# Patient Record
Sex: Female | Born: 1970
Health system: Southern US, Community
[De-identification: ages and names within clinical notes are randomized; demographics above are authoritative.]

## PROBLEM LIST (undated history)

## (undated) DIAGNOSIS — K589 Irritable bowel syndrome without diarrhea: Secondary | ICD-10-CM

## (undated) DIAGNOSIS — Z9109 Other allergy status, other than to drugs and biological substances: Secondary | ICD-10-CM

## (undated) DIAGNOSIS — E785 Hyperlipidemia, unspecified: Secondary | ICD-10-CM

## (undated) DIAGNOSIS — E559 Vitamin D deficiency, unspecified: Secondary | ICD-10-CM

## (undated) DIAGNOSIS — T4145XA Adverse effect of unspecified anesthetic, initial encounter: Secondary | ICD-10-CM

## (undated) DIAGNOSIS — E739 Lactose intolerance, unspecified: Secondary | ICD-10-CM

## (undated) DIAGNOSIS — G43109 Migraine with aura, not intractable, without status migrainosus: Secondary | ICD-10-CM

## (undated) DIAGNOSIS — K219 Gastro-esophageal reflux disease without esophagitis: Secondary | ICD-10-CM

## (undated) DIAGNOSIS — T8859XA Other complications of anesthesia, initial encounter: Secondary | ICD-10-CM

## (undated) DIAGNOSIS — R51 Headache: Secondary | ICD-10-CM

## (undated) DIAGNOSIS — J309 Allergic rhinitis, unspecified: Secondary | ICD-10-CM

## (undated) HISTORY — PX: OTHER SURGICAL HISTORY: SHX169

## (undated) HISTORY — DX: Hyperlipidemia, unspecified: E78.5

## (undated) HISTORY — DX: Vitamin D deficiency, unspecified: E55.9

## (undated) HISTORY — DX: Allergic rhinitis, unspecified: J30.9

## (undated) HISTORY — DX: Lactose intolerance, unspecified: E73.9

## (undated) HISTORY — DX: Gastro-esophageal reflux disease without esophagitis: K21.9

## (undated) HISTORY — DX: Migraine with aura, not intractable, without status migrainosus: G43.109

## (undated) HISTORY — DX: Irritable bowel syndrome, unspecified: K58.9

---
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
## 2013-02-13 ENCOUNTER — Other Ambulatory Visit: Payer: Self-pay | Admitting: Neurology

## 2013-02-13 ENCOUNTER — Ambulatory Visit
Admission: RE | Admit: 2013-02-13 | Discharge: 2013-02-13 | Disposition: A | Discharge status: Home / Self Care | Payer: Self-pay | Source: Ambulatory Visit | Attending: Neurology | Admitting: Neurology

## 2013-02-13 DIAGNOSIS — R836 Abnormal cytological findings in cerebrospinal fluid: Secondary | ICD-10-CM

## 2013-02-13 DIAGNOSIS — G373 Acute transverse myelitis in demyelinating disease of central nervous system: Secondary | ICD-10-CM

## 2013-02-13 DIAGNOSIS — L52 Erythema nodosum: Secondary | ICD-10-CM | POA: Insufficient documentation

## 2013-02-13 DIAGNOSIS — M542 Cervicalgia: Secondary | ICD-10-CM

## 2013-02-13 NOTE — Assessment & Plan Note (Addendum)
IMPRESSION: #1. Transverse Myelitis #2. History of Erythema Nodosum  PLAN: R/O MS vs Sarcoidosis Treat with high dose Solumedrol 1000 mg IV daily for 3 days followed by a tapering course of po prednisone.

## 2013-02-13 NOTE — Progress Notes (Signed)
Patient ID: Heather Moreno, female   DOB: 06-Feb-1971, 42 y.o.   MRN: [id number]

## 2013-02-13 NOTE — Progress Notes (Signed)
Blood drawn from right AC to go with spinal fluid. Site unremarkable and pt tolerated procedure well.

## 2013-02-14 ENCOUNTER — Encounter (HOSPITAL_COMMUNITY): Payer: Self-pay

## 2013-02-14 ENCOUNTER — Encounter (HOSPITAL_COMMUNITY)
Admission: RE | Admit: 2013-02-14 | Discharge: 2013-02-14 | Disposition: A | Discharge status: Home / Self Care | Payer: BC Managed Care – PPO | Source: Ambulatory Visit | Attending: Neurology | Admitting: Neurology

## 2013-02-14 DIAGNOSIS — G0489 Other myelitis: Secondary | ICD-10-CM | POA: Insufficient documentation

## 2013-02-14 HISTORY — DX: Other complications of anesthesia, initial encounter: T88.59XA

## 2013-02-14 HISTORY — DX: Adverse effect of unspecified anesthetic, initial encounter: T41.45XA

## 2013-02-14 HISTORY — DX: Other allergy status, other than to drugs and biological substances: Z91.09

## 2013-02-14 HISTORY — DX: Headache: R51

## 2013-02-14 MED ORDER — SODIUM CHLORIDE 0.9 % IV SOLN
1000.0000 mg | Freq: Every day | INTRAVENOUS | Status: DC
  Administered 2013-02-14: 1000 mg via INTRAVENOUS
  Filled 2013-02-14 (×2): qty 8

## 2013-02-14 MED ORDER — SODIUM CHLORIDE 0.9 % IV SOLN
Freq: Every day | INTRAVENOUS | Status: DC
  Administered 2013-02-14: 17:00:00 via INTRAVENOUS

## 2013-02-14 NOTE — Progress Notes (Signed)
Pt reports bitter taste in mouth like tonic water after receiving solumedrol IV. Instructed to do frequent oral care and to bring flavored lifesavers for the next 2 infusions.

## 2013-02-15 ENCOUNTER — Encounter (HOSPITAL_COMMUNITY)
Admission: RE | Admit: 2013-02-15 | Discharge: 2013-02-15 | Disposition: A | Discharge status: Home / Self Care | Payer: BC Managed Care – PPO | Source: Ambulatory Visit | Attending: Neurology | Admitting: Neurology

## 2013-02-15 ENCOUNTER — Encounter (HOSPITAL_COMMUNITY): Payer: Self-pay

## 2013-02-15 MED ORDER — SODIUM CHLORIDE 0.9 % IV SOLN
Freq: Every day | INTRAVENOUS | Status: DC
  Administered 2013-02-15: 17:00:00 via INTRAVENOUS

## 2013-02-15 MED ORDER — SODIUM CHLORIDE 0.9 % IV SOLN
1000.0000 mg | Freq: Every day | INTRAVENOUS | Status: DC
  Administered 2013-02-15: 1000 mg via INTRAVENOUS
  Filled 2013-02-15 (×2): qty 8

## 2013-02-16 ENCOUNTER — Encounter (HOSPITAL_COMMUNITY)
Admission: RE | Admit: 2013-02-16 | Discharge: 2013-02-16 | Disposition: A | Discharge status: Home / Self Care | Payer: BC Managed Care – PPO | Source: Ambulatory Visit | Attending: Neurology | Admitting: Neurology

## 2013-02-16 ENCOUNTER — Encounter (HOSPITAL_COMMUNITY): Payer: Self-pay

## 2013-02-16 LAB — CNS IGG SYNTHESIS RATE, CSF+BLOOD
Albumin, CSF: 9.9 mg/dL (ref 8.0–42.0)
Albumin, Serum(Neph): 4.1 g/dL (ref 3.5–4.9)
IgG Index, CSF: 0.9 — ABNORMAL HIGH (ref <0.66)
MS CNS IgG Synthesis Rate: 1.9 mg/24 h (ref <=3.3)

## 2013-02-16 MED ORDER — SODIUM CHLORIDE 0.9 % IV SOLN
1000.0000 mg | Freq: Every day | INTRAVENOUS | Status: AC
  Administered 2013-02-16: 1000 mg via INTRAVENOUS
  Filled 2013-02-16: qty 8

## 2013-02-16 MED ORDER — SODIUM CHLORIDE 0.9 % IV SOLN
Freq: Every day | INTRAVENOUS | Status: AC
  Administered 2013-02-16: 16:00:00 via INTRAVENOUS

## 2013-02-18 LAB — CSF PANEL II
RBC Count, CSF: 0 cu mm
WBC, CSF: 4 cu mm (ref 0–5)

## 2013-02-22 ENCOUNTER — Other Ambulatory Visit: Payer: Self-pay | Admitting: Neurology

## 2013-02-22 ENCOUNTER — Ambulatory Visit
Admission: RE | Admit: 2013-02-22 | Discharge: 2013-02-22 | Disposition: A | Discharge status: Home / Self Care | Payer: BC Managed Care – PPO | Source: Ambulatory Visit | Attending: Neurology | Admitting: Neurology

## 2013-02-22 DIAGNOSIS — R079 Chest pain, unspecified: Secondary | ICD-10-CM

## 2013-02-22 DIAGNOSIS — G35 Multiple sclerosis: Secondary | ICD-10-CM

## 2013-02-22 DIAGNOSIS — R209 Unspecified disturbances of skin sensation: Secondary | ICD-10-CM

## 2013-02-22 DIAGNOSIS — M79602 Pain in left arm: Secondary | ICD-10-CM

## 2013-02-22 DIAGNOSIS — M25512 Pain in left shoulder: Secondary | ICD-10-CM

## 2013-02-23 ENCOUNTER — Other Ambulatory Visit: Payer: Self-pay | Admitting: Neurology

## 2013-02-23 DIAGNOSIS — R209 Unspecified disturbances of skin sensation: Secondary | ICD-10-CM

## 2013-02-24 DIAGNOSIS — G35D Multiple sclerosis, unspecified: Secondary | ICD-10-CM | POA: Insufficient documentation

## 2013-02-24 DIAGNOSIS — G35 Multiple sclerosis: Secondary | ICD-10-CM | POA: Insufficient documentation

## 2013-02-24 NOTE — Procedures (Signed)
History:  Heather Moreno is a 42 year old female with onset of left shoulder and arm discomfort, left-sided numbness. MRI abnormalities are noted in the upper cervical spinal cord. The patient is being evaluated for demyelinating disease.  Description: The brainstem auditory evoked response test was performed today using 95 dB rarefraction clicks in the ipsilateral ear and 40 dB masking noise in the contralateral ear. The absolute latencies for waveforms I, III, and V were within normal limits bilaterally. The interpeak latencies for waveforms I-III, III-V, and I-V were within normal limits bilaterally. The amplitudes of waveforms I and V were within normal limits bilaterally.  Impression:  The brainstem auditory evoked response test done today was within normal limits bilaterally. No evidence of conduction slowing within the peripheral or central nervous system on either side was seen on today's evaluation.

## 2013-02-24 NOTE — Procedures (Signed)
History:  Heather Moreno is a 42 year old female with onset of left shoulder and arm discomfort, left-sided numbness. MRI abnormalities are noted in the upper cervical spinal cord. The patient is being evaluated for demyelinating disease.  Description: The visual evoked response test was performed today using 32 x 32 check sizes. The absolute latencies for the N1 and the P100 wave forms were within normal limits bilaterally. The amplitudes for the P100 wave forms were also within normal limits bilaterally. The visual acuity was 20/20 OD and 20/20 OS uncorrected.  Impression:  The visual evoked response test above was within normal limits bilaterally. No evidence of conduction slowing was seen within the anterior visual pathways on either side on today's evaluation.

## 2013-02-27 LAB — OTHER SOLSTAS TEST

## 2014-03-22 IMAGING — CR DG CHEST 2V
2 series · 2 of 2 positions shown · non-contrast
Comparison: None

CLINICAL DATA: Left shoulder and arm pain

CHEST - 2 VIEW

[view not recorded (1 of 2)]
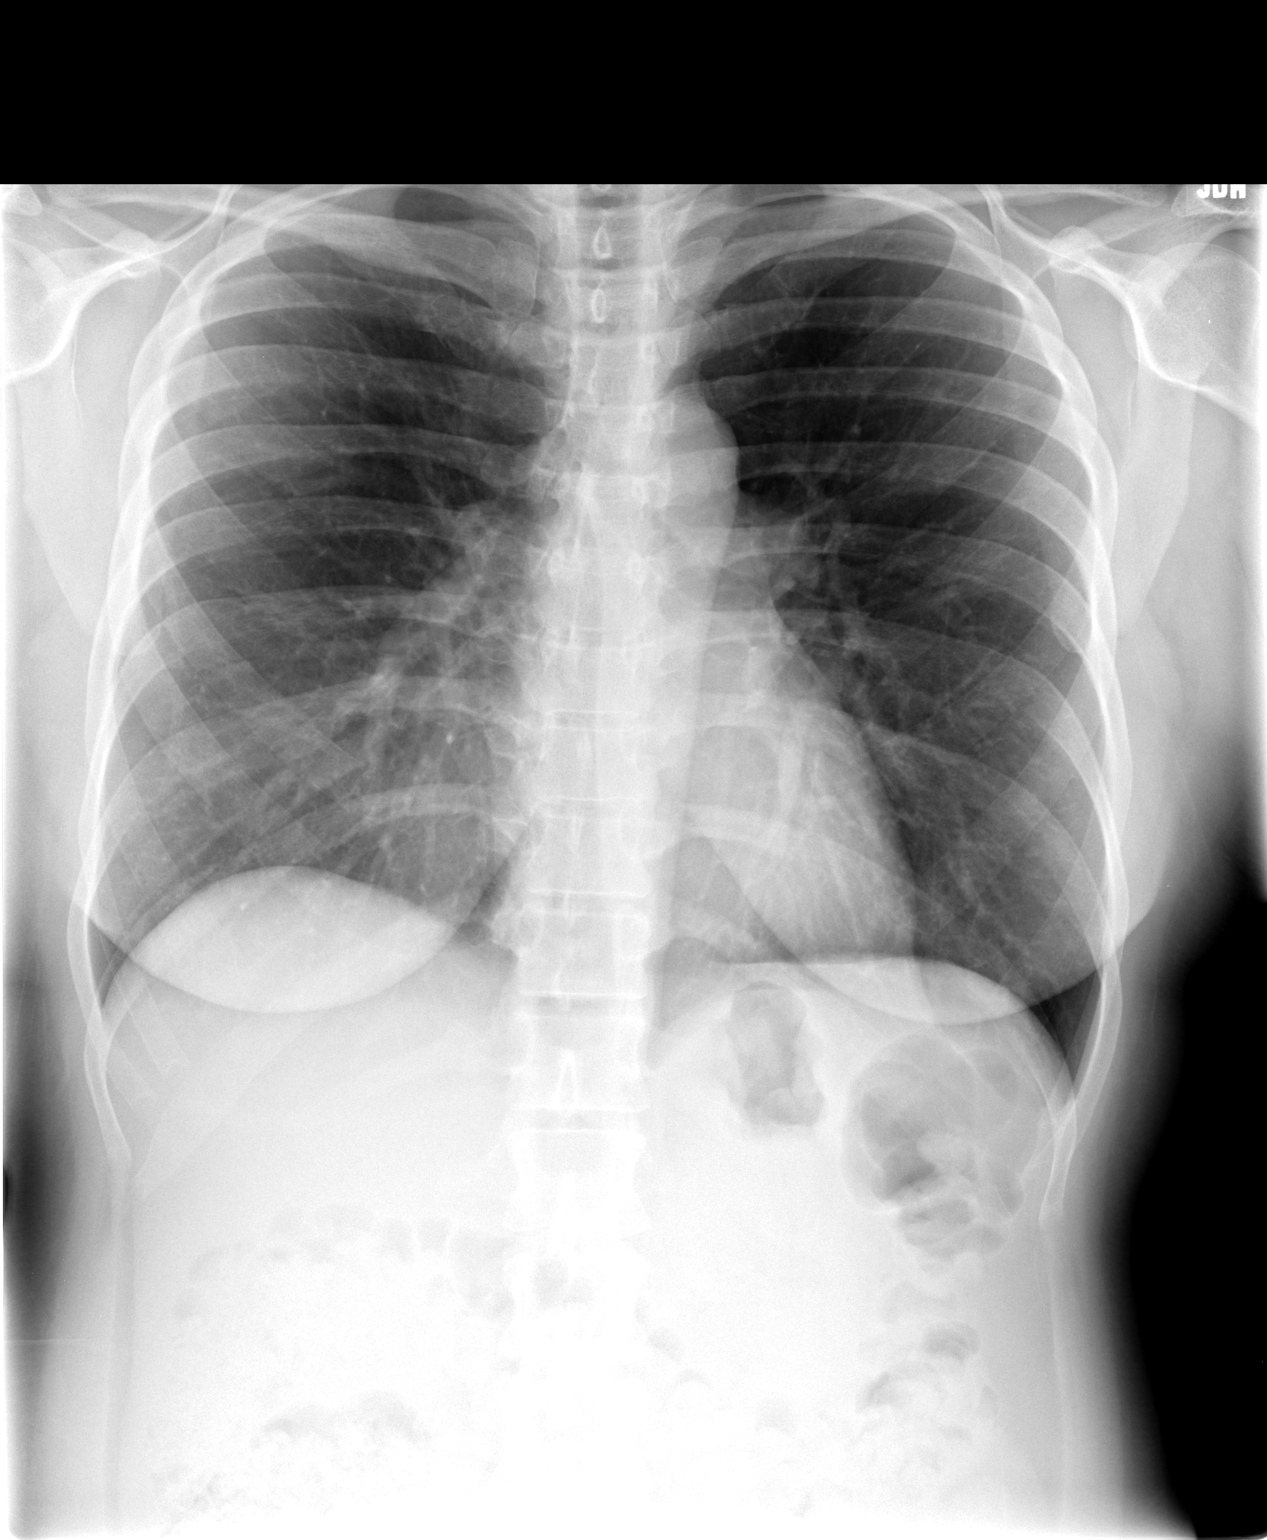

[view not recorded (2 of 2)]
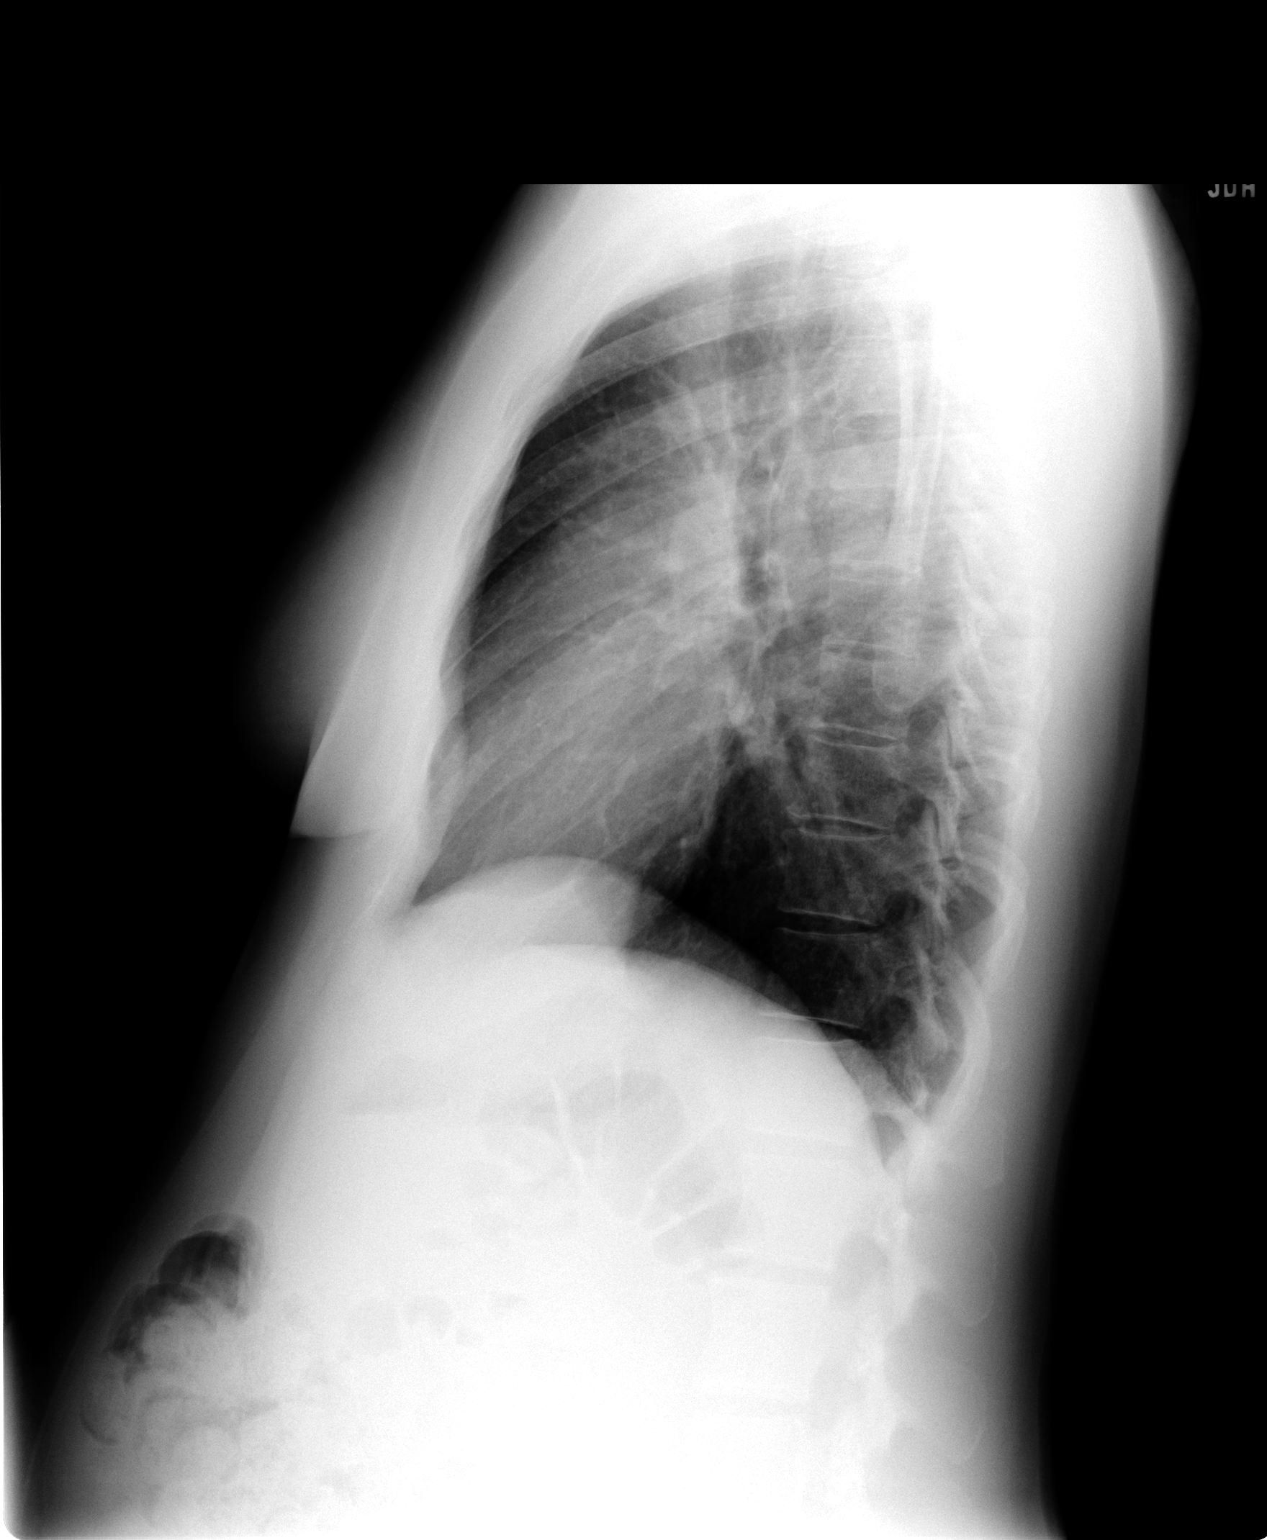

[2 of 2 positions shown; findings below may reference images not displayed]

FINDINGS: The heart size and mediastinal contours are within normal
limits.  Both lungs are clear.  The visualized skeletal structures
are unremarkable.
IMPRESSION: Negative examination.

## 2014-07-27 ENCOUNTER — Telehealth: Payer: Self-pay | Admitting: Neurology

## 2016-03-27 DIAGNOSIS — H33102 Unspecified retinoschisis, left eye: Secondary | ICD-10-CM | POA: Diagnosis not present

## 2016-03-27 DIAGNOSIS — H5213 Myopia, bilateral: Secondary | ICD-10-CM | POA: Diagnosis not present

## 2016-07-06 NOTE — Telephone Encounter (Signed)
Close encounter 

## 2017-04-02 DIAGNOSIS — H33102 Unspecified retinoschisis, left eye: Secondary | ICD-10-CM | POA: Diagnosis not present

## 2017-04-02 DIAGNOSIS — H5213 Myopia, bilateral: Secondary | ICD-10-CM | POA: Diagnosis not present

## 2017-04-02 DIAGNOSIS — H15101 Unspecified episcleritis, right eye: Secondary | ICD-10-CM | POA: Diagnosis not present

## 2017-04-22 DIAGNOSIS — H33322 Round hole, left eye: Secondary | ICD-10-CM | POA: Diagnosis not present

## 2017-04-22 DIAGNOSIS — H33102 Unspecified retinoschisis, left eye: Secondary | ICD-10-CM | POA: Diagnosis not present

## 2017-04-29 DIAGNOSIS — Z1151 Encounter for screening for human papillomavirus (HPV): Secondary | ICD-10-CM | POA: Diagnosis not present

## 2017-04-29 DIAGNOSIS — Z6830 Body mass index (BMI) 30.0-30.9, adult: Secondary | ICD-10-CM | POA: Diagnosis not present

## 2017-04-29 DIAGNOSIS — Z01419 Encounter for gynecological examination (general) (routine) without abnormal findings: Secondary | ICD-10-CM | POA: Diagnosis not present

## 2017-04-29 DIAGNOSIS — Z1231 Encounter for screening mammogram for malignant neoplasm of breast: Secondary | ICD-10-CM | POA: Diagnosis not present

## 2017-05-18 DIAGNOSIS — Z0000 Encounter for general adult medical examination without abnormal findings: Secondary | ICD-10-CM | POA: Diagnosis not present

## 2017-05-18 DIAGNOSIS — N920 Excessive and frequent menstruation with regular cycle: Secondary | ICD-10-CM | POA: Diagnosis not present

## 2017-05-18 DIAGNOSIS — E049 Nontoxic goiter, unspecified: Secondary | ICD-10-CM | POA: Diagnosis not present

## 2017-05-18 DIAGNOSIS — R635 Abnormal weight gain: Secondary | ICD-10-CM | POA: Diagnosis not present

## 2017-06-04 DIAGNOSIS — Z13220 Encounter for screening for lipoid disorders: Secondary | ICD-10-CM | POA: Diagnosis not present

## 2017-06-04 DIAGNOSIS — E049 Nontoxic goiter, unspecified: Secondary | ICD-10-CM | POA: Diagnosis not present

## 2017-06-04 DIAGNOSIS — Z0000 Encounter for general adult medical examination without abnormal findings: Secondary | ICD-10-CM | POA: Diagnosis not present

## 2017-06-04 DIAGNOSIS — L659 Nonscarring hair loss, unspecified: Secondary | ICD-10-CM | POA: Diagnosis not present

## 2017-08-03 DIAGNOSIS — L638 Other alopecia areata: Secondary | ICD-10-CM | POA: Diagnosis not present

## 2017-11-05 DIAGNOSIS — L638 Other alopecia areata: Secondary | ICD-10-CM | POA: Diagnosis not present

## 2017-11-05 DIAGNOSIS — L738 Other specified follicular disorders: Secondary | ICD-10-CM | POA: Diagnosis not present

## 2017-12-10 DIAGNOSIS — L638 Other alopecia areata: Secondary | ICD-10-CM | POA: Diagnosis not present

## 2017-12-10 DIAGNOSIS — L811 Chloasma: Secondary | ICD-10-CM | POA: Diagnosis not present

## 2018-03-25 DIAGNOSIS — L638 Other alopecia areata: Secondary | ICD-10-CM | POA: Diagnosis not present

## 2018-03-25 DIAGNOSIS — L811 Chloasma: Secondary | ICD-10-CM | POA: Diagnosis not present

## 2018-03-30 ENCOUNTER — Telehealth: Payer: Self-pay | Admitting: Neurology

## 2018-03-30 NOTE — Telephone Encounter (Signed)
She is a 47 year old woman who has a history of transverse myelitis.   Over the past couple weeks she has had pain in the upper thoracic region that radiates to the armpit.  One more intense, it will radiate towards the fifth finger.     She has point tenderness to the left of T1.  Strength was normal.  Using sterile technique, the left C7-T1 and T1-T2 paraspinal muscles were injected with 80 mg Depo-Medrol and 2.5 cc Lidocaine.  She tolerated the procedure well and there were no complications.

## 2018-04-08 DIAGNOSIS — H5213 Myopia, bilateral: Secondary | ICD-10-CM | POA: Diagnosis not present

## 2018-04-08 DIAGNOSIS — H33102 Unspecified retinoschisis, left eye: Secondary | ICD-10-CM | POA: Diagnosis not present

## 2018-04-08 DIAGNOSIS — H524 Presbyopia: Secondary | ICD-10-CM | POA: Diagnosis not present

## 2018-06-16 ENCOUNTER — Encounter (INDEPENDENT_AMBULATORY_CARE_PROVIDER_SITE_OTHER): Payer: Self-pay

## 2018-06-30 ENCOUNTER — Ambulatory Visit (INDEPENDENT_AMBULATORY_CARE_PROVIDER_SITE_OTHER): Payer: BLUE CROSS/BLUE SHIELD | Admitting: Family Medicine

## 2018-06-30 ENCOUNTER — Encounter (INDEPENDENT_AMBULATORY_CARE_PROVIDER_SITE_OTHER): Payer: Self-pay | Admitting: Family Medicine

## 2018-06-30 VITALS — BP 121/71 | HR 81 | Temp 98.3°F | Ht 62.0 in | Wt 161.0 lb

## 2018-06-30 DIAGNOSIS — R0602 Shortness of breath: Secondary | ICD-10-CM

## 2018-06-30 DIAGNOSIS — Z9189 Other specified personal risk factors, not elsewhere classified: Secondary | ICD-10-CM

## 2018-06-30 DIAGNOSIS — R5383 Other fatigue: Secondary | ICD-10-CM | POA: Diagnosis not present

## 2018-06-30 DIAGNOSIS — R739 Hyperglycemia, unspecified: Secondary | ICD-10-CM | POA: Diagnosis not present

## 2018-06-30 DIAGNOSIS — Z1331 Encounter for screening for depression: Secondary | ICD-10-CM | POA: Diagnosis not present

## 2018-06-30 DIAGNOSIS — Z0289 Encounter for other administrative examinations: Secondary | ICD-10-CM

## 2018-06-30 DIAGNOSIS — E559 Vitamin D deficiency, unspecified: Secondary | ICD-10-CM

## 2018-06-30 DIAGNOSIS — E663 Overweight: Secondary | ICD-10-CM

## 2018-07-01 LAB — CBC WITH DIFFERENTIAL
BASOS ABS: 0.1 10*3/uL (ref 0.0–0.2)
Basos: 1 %
EOS (ABSOLUTE): 0.4 10*3/uL (ref 0.0–0.4)
Eos: 5 %
HEMOGLOBIN: 14.1 g/dL (ref 11.1–15.9)
Hematocrit: 43.7 % (ref 34.0–46.6)
IMMATURE GRANULOCYTES: 0 %
Immature Grans (Abs): 0 10*3/uL (ref 0.0–0.1)
LYMPHS ABS: 1.8 10*3/uL (ref 0.7–3.1)
Lymphs: 24 %
MCH: 29.1 pg (ref 26.6–33.0)
MCHC: 32.3 g/dL (ref 31.5–35.7)
MCV: 90 fL (ref 79–97)
MONOCYTES: 6 %
MONOS ABS: 0.4 10*3/uL (ref 0.1–0.9)
Neutrophils Absolute: 4.9 10*3/uL (ref 1.4–7.0)
Neutrophils: 64 %
RBC: 4.84 x10E6/uL (ref 3.77–5.28)
RDW: 13.9 % (ref 12.3–15.4)
WBC: 7.6 10*3/uL (ref 3.4–10.8)

## 2018-07-01 LAB — VITAMIN B12: VITAMIN B 12: 710 pg/mL (ref 232–1245)

## 2018-07-01 LAB — T3: T3, Total: 119 ng/dL (ref 71–180)

## 2018-07-01 LAB — COMPREHENSIVE METABOLIC PANEL
ALBUMIN: 4.7 g/dL (ref 3.5–5.5)
ALK PHOS: 91 IU/L (ref 39–117)
ALT: 50 IU/L — ABNORMAL HIGH (ref 0–32)
AST: 25 IU/L (ref 0–40)
Albumin/Globulin Ratio: 1.7 (ref 1.2–2.2)
BUN / CREAT RATIO: 11 (ref 9–23)
BUN: 10 mg/dL (ref 6–24)
Bilirubin Total: 0.4 mg/dL (ref 0.0–1.2)
CO2: 22 mmol/L (ref 20–29)
Calcium: 9.5 mg/dL (ref 8.7–10.2)
Chloride: 98 mmol/L (ref 96–106)
Creatinine, Ser: 0.89 mg/dL (ref 0.57–1.00)
GFR calc Af Amer: 90 mL/min/{1.73_m2} (ref >59)
GFR calc non Af Amer: 78 mL/min/{1.73_m2} (ref >59)
GLUCOSE: 102 mg/dL — AB (ref 65–99)
Globulin, Total: 2.7 g/dL (ref 1.5–4.5)
Potassium: 4.5 mmol/L (ref 3.5–5.2)
SODIUM: 138 mmol/L (ref 134–144)
Total Protein: 7.4 g/dL (ref 6.0–8.5)

## 2018-07-01 LAB — FOLATE: FOLATE: 16.9 ng/mL (ref >3.0)

## 2018-07-01 LAB — LIPID PANEL WITH LDL/HDL RATIO
Cholesterol, Total: 232 mg/dL — ABNORMAL HIGH (ref 100–199)
HDL: 72 mg/dL (ref >39)
LDL CALC: 134 mg/dL — AB (ref 0–99)
LDl/HDL Ratio: 1.9 ratio (ref 0.0–3.2)
Triglycerides: 130 mg/dL (ref 0–149)
VLDL CHOLESTEROL CAL: 26 mg/dL (ref 5–40)

## 2018-07-01 LAB — T4, FREE: FREE T4: 1.36 ng/dL (ref 0.82–1.77)

## 2018-07-01 LAB — INSULIN, RANDOM: INSULIN: 25.3 u[IU]/mL — AB (ref 2.6–24.9)

## 2018-07-01 LAB — TSH: TSH: 2.17 u[IU]/mL (ref 0.450–4.500)

## 2018-07-01 LAB — VITAMIN D 25 HYDROXY (VIT D DEFICIENCY, FRACTURES): Vit D, 25-Hydroxy: 13.6 ng/mL — ABNORMAL LOW (ref 30.0–100.0)

## 2018-07-01 LAB — HEMOGLOBIN A1C
Est. average glucose Bld gHb Est-mCnc: 134 mg/dL
Hgb A1c MFr Bld: 6.3 % — ABNORMAL HIGH (ref 4.8–5.6)

## 2018-07-06 NOTE — Progress Notes (Signed)
Office: [phone]  /  Fax: [phone]   Dear Dr. Barbaraann Barthel,   Thank you for referring Heather Moreno to our clinic. The following note includes my evaluation and treatment recommendations.  HPI:   Chief Complaint: OBESITY    Heather Moreno has been referred by Turkey R. Rankins, MD for consultation regarding her obesity and obesity related comorbidities.    Heather Moreno (MR# [id number]) is a 47 y.o. female who presents on 06/30/2018 for obesity evaluation and treatment. Current BMI is Body mass index is 29.45 kg/m.Marland Kitchen Shadawn has been struggling with her weight for many years and has been unsuccessful in either losing weight, maintaining weight loss, or reaching her healthy weight goal.     Heather Moreno attended our information session and states she is currently in the action stage of change and ready to dedicate time achieving and maintaining a healthier weight. Heather Moreno is interested in becoming our patient and working on intensive lifestyle modifications including (but not limited to) diet, exercise and weight loss.    Heather Moreno states her family eats meals together she thinks her family will eat healthier with  her her desired weight loss is 21-26 lbs she started gaining weight post 47 years old her heaviest weight ever was 168 lbs she has significant food cravings issues  she is frequently drinking liquids with calories she frequently eats larger portions than normal  she struggles with emotional eating    Fatigue Heather Moreno feels her energy is lower than it should be. This has worsened with weight gain and has not worsened recently. Heather Moreno admits to daytime somnolence and  denies waking up still tired. Patient is at risk for obstructive sleep apnea. Patent has a history of symptoms of daytime fatigue. Patient generally gets 7 hours of sleep per night, and states they generally have generally restful sleep. Snoring is present. Apneic episodes are not present. Epworth Sleepiness Score is 5.  Dyspnea on  exertion Heather Moreno notes increasing shortness of breath with exercising and seems to be worsening over time with weight gain. She notes getting out of breath sooner with activity than she used to. This has not gotten worse recently. Heather Moreno denies orthopnea.  Vitamin D Deficiency Heather Moreno has a diagnosis of vitamin D deficiency. She is not on Vit D. She notes fatigue and denies nausea, vomiting or muscle weakness.  Hyperglycemia Heather Moreno has a history of some elevated blood glucose readings without a diagnosis of diabetes. She has a fasting glucose of 103 per patient and history of gestational diabetes mellitus with pregnancies. She admits to polyphagia.  At risk for diabetes Heather Moreno is at higher than average risk for developing diabetes due to her obesity and hyperglycemia. She currently denies polyuria or polydipsia.  Depression Screen Heather Moreno's Food and Mood (modified PHQ-9) score was  Depression screen PHQ 2/9 06/30/2018  Decreased Interest 1  Down, Depressed, Hopeless 1  PHQ - 2 Score 2  Altered sleeping 1  Tired, decreased energy 1  Change in appetite 1  Feeling bad or failure about yourself  1  Trouble concentrating 0  Moving slowly or fidgety/restless 0  Suicidal thoughts 0  PHQ-9 Score 6  Difficult doing work/chores Not difficult at all    ALLERGIES: Allergies  Allergen Reactions  . Aspirin Other (See Comments)    rhinitis  . Amoxicillin Rash    MEDICATIONS: No current outpatient medications on file prior to visit.   No current facility-administered medications on file prior to visit.     PAST MEDICAL HISTORY: Past Medical History:  Diagnosis Date  . Allergic rhinitis   . Complication of anesthesia    difficulty with weakness after spinal  . Environmental allergies   . GERD (gastroesophageal reflux disease)   . Headache(784.0)   . HLD (hyperlipidemia)   . IBS (irritable bowel syndrome)   . Lactose intolerance   . Migraine headache with aura   . Vitamin D deficiency      PAST SURGICAL HISTORY: Past Surgical History:  Procedure Laterality Date  . cesarean sections  2009, 2003  . NASAL SINUS SURGERY Bilateral 1989    SOCIAL HISTORY: Social History   Tobacco Use  . Smoking status: Never Smoker  . Smokeless tobacco: Never Used  Substance Use Topics  . Alcohol use: Not on file  . Drug use: Not on file    FAMILY HISTORY: Family History  Problem Relation Age of Onset  . Diabetes Mother   . Hypertension Mother   . Hyperlipidemia Mother   . Thyroid disease Mother   . Diabetes Father   . Hypertension Father   . Hyperlipidemia Father   . Stroke Father   . Heart disease Father     ROS: Review of Systems  Constitutional: Positive for malaise/fatigue. Negative for weight loss.  HENT:       + Nasal stuffiness  Eyes:       + Wear glasses or contacts  Respiratory: Positive for shortness of breath.   Cardiovascular: Negative for orthopnea.  Gastrointestinal: Positive for heartburn. Negative for nausea and vomiting.  Genitourinary: Negative for frequency.  Musculoskeletal: Positive for neck pain.       Negative muscle weakness  Skin:       + Hair or nail changes  Neurological: Positive for headaches.  Endo/Heme/Allergies: Negative for polydipsia.       Positive polyphagia    PHYSICAL EXAM: Blood pressure 121/71, pulse 81, temperature 98.3 F (36.8 C), temperature source Oral, height 5\' 2"  (1.575 m), weight 161 lb (73 kg), last menstrual period 06/11/2018, SpO2 98 %. Body mass index is 29.45 kg/m. Physical Exam  Constitutional: She is oriented to person, place, and time. She appears well-developed and well-nourished.  HENT:  Head: Normocephalic and atraumatic.  Nose: Nose normal.  Eyes: EOM are normal. No scleral icterus.  Neck: Normal range of motion. Neck supple. No thyromegaly present.  Cardiovascular: Normal rate and regular rhythm.  Pulmonary/Chest: Effort normal. No respiratory distress.  Abdominal: Soft. There is no  tenderness.  + Obesity  Musculoskeletal:  Range of Motion normal in all 4 extremities Trace edema noted in bilateral lower extremities  Neurological: She is alert and oriented to person, place, and time. Coordination normal.  Skin: Skin is warm and dry.  Psychiatric: She has a normal mood and affect. Her behavior is normal.  Vitals reviewed.   RECENT LABS AND TESTS: BMET    Component Value Date/Time   NA 138 06/30/2018 0937   K 4.5 06/30/2018 0937   CL 98 06/30/2018 0937   CO2 22 06/30/2018 0937   GLUCOSE 102 (H) 06/30/2018 0937   BUN 10 06/30/2018 0937   CREATININE 0.89 06/30/2018 0937   CALCIUM 9.5 06/30/2018 0937   GFRNONAA 78 06/30/2018 0937   GFRAA 90 06/30/2018 0937   Lab Results  Component Value Date   HGBA1C 6.3 (H) 06/30/2018   Lab Results  Component Value Date   INSULIN 25.3 (H) 06/30/2018   CBC    Component Value Date/Time   WBC 7.6 06/30/2018 0937   RBC 4.84 06/30/2018 0937  HGB 14.1 06/30/2018 0937   HCT 43.7 06/30/2018 0937   MCV 90 06/30/2018 0937   MCH 29.1 06/30/2018 0937   MCHC 32.3 06/30/2018 0937   RDW 13.9 06/30/2018 0937   LYMPHSABS 1.8 06/30/2018 0937   EOSABS 0.4 06/30/2018 0937   BASOSABS 0.1 06/30/2018 0937   Iron/TIBC/Ferritin/ %Sat No results found for: IRON, TIBC, FERRITIN, IRONPCTSAT Lipid Panel     Component Value Date/Time   CHOL 232 (H) 06/30/2018 0937   TRIG 130 06/30/2018 0937   HDL 72 06/30/2018 0937   LDLCALC 134 (H) 06/30/2018 0937   Hepatic Function Panel     Component Value Date/Time   PROT 7.4 06/30/2018 0937   ALBUMIN 4.7 06/30/2018 0937   AST 25 06/30/2018 0937   ALT 50 (H) 06/30/2018 0937   ALKPHOS 91 06/30/2018 0937   BILITOT 0.4 06/30/2018 0937      Component Value Date/Time   TSH 2.170 06/30/2018 0937    ECG  shows NSR with a rate of 97 BPM INDIRECT CALORIMETER done today shows a VO2 of 279 and a REE of 1940.  Her calculated basal metabolic rate is 1610 thus her basal metabolic rate is better  than expected.    ASSESSMENT AND PLAN: Other fatigue - Plan: EKG 12-Lead, Vitamin B12, CBC With Differential, Folate, Lipid Panel With LDL/HDL Ratio, T4, free, T3, TSH  Shortness of breath on exertion - Plan: CBC With Differential  Vitamin D deficiency - Plan: VITAMIN D 25 Hydroxy (Vit-D Deficiency, Fractures)  Hyperglycemia - Plan: Comprehensive metabolic panel, Insulin, random, Hemoglobin A1c  At risk for diabetes mellitus  Depression screening  Overweight (BMI 25.0-29.9)  PLAN:  Fatigue Heather Moreno was informed that her fatigue may be related to obesity, depression or many other causes. Labs will be ordered, and in the meanwhile Addisyn has agreed to work on diet, exercise and weight loss to help with fatigue. Proper sleep hygiene was discussed including the need for 7-8 hours of quality sleep each night. A sleep study was not ordered based on symptoms and Epworth score.  Dyspnea on exertion Heather Moreno's shortness of breath appears to be obesity related and exercise induced. She has agreed to work on weight loss and gradually increase exercise to treat her exercise induced shortness of breath. If Lexa follows our instructions and loses weight without improvement of her shortness of breath, we will plan to refer to pulmonology. We will monitor this condition regularly. Heather Moreno agrees to this plan.  Vitamin D Deficiency Heather Moreno was informed that low vitamin D levels contributes to fatigue and are associated with obesity, breast, and colon cancer. She will follow up for routine testing of vitamin D, at least 2-3 times per year. She was informed of the risk of over-replacement of vitamin D and agrees to not increase her dose unless she discusses this with [place] first. We will check labs and Jenessa agrees to follow up with our clinic in 2 weeks.  Hyperglycemia Fasting labs will be obtained and results with be discussed with Heather Moreno in 2 weeks at her follow up visit. In the meanwhile Heather Moreno was started on a  lower simple carbohydrate diet and will work on weight loss efforts.  Diabetes risk counselling Heather Moreno was given extended (15 minutes) diabetes prevention counseling today. She is 47 y.o. female and has risk factors for diabetes including obesity and hyperglycemia. We discussed intensive lifestyle modifications today with an emphasis on weight loss as well as increasing exercise and decreasing simple carbohydrates in her diet.  Depression  Screen Heather Moreno had a mildly positive depression screening. Depression is commonly associated with obesity and often results in emotional eating behaviors. We will monitor this closely and work on CBT to help improve the non-hunger eating patterns. Referral to Psychology may be required if no improvement is seen as she continues in our clinic.  Obesity Heather Moreno is currently in the action stage of change and her goal is to continue with weight loss efforts. I recommend Heather Moreno begin the structured treatment plan as follows:  She has agreed to keep a food journal with 1400-1500 calories and 90+ grams of protein daily Heather Moreno has been instructed to eventually work up to a goal of 150 minutes of combined cardio and strengthening exercise per week for weight loss and overall health benefits. We discussed the following Behavioral Modification Strategies today: increasing lean protein intake, work on meal planning and easy cooking plans, and keep a strict food journal   She was informed of the importance of frequent follow up visits to maximize her success with intensive lifestyle modifications for her multiple health conditions. She was informed we would discuss her lab results at her next visit unless there is a critical issue that needs to be addressed sooner. Saree agreed to keep her next visit at the agreed upon time to discuss these results.    OBESITY BEHAVIORAL INTERVENTION VISIT  Today's visit was # 1 out of 22.  Starting weight: 161 lbs Starting date:  06/30/18 Today's weight : 161 lbs  Today's date: 06/30/2018 Total lbs lost to date: 0 (Patients must lose 7 lbs in the first 6 months to continue with counseling)   ASK: We discussed the diagnosis of obesity with [name] today and Myla agreed to give [place] permission to discuss obesity behavioral modification therapy today.  ASSESS: Leidy has the diagnosis of obesity and her BMI today is 29.44 Andee is in the action stage of change   ADVISE: Sinda was educated on the multiple health risks of obesity as well as the benefit of weight loss to improve her health. She was advised of the need for long term treatment and the importance of lifestyle modifications.  AGREE: Multiple dietary modification options and treatment options were discussed and  Deirdre agreed to the above obesity treatment plan.   I, Burt Knack, am acting as transcriptionist for Quillian Quince, MD  I have reviewed the above documentation for accuracy and completeness, and I agree with the above. -Quillian Quince, MD

## 2018-07-14 ENCOUNTER — Ambulatory Visit (INDEPENDENT_AMBULATORY_CARE_PROVIDER_SITE_OTHER): Payer: BLUE CROSS/BLUE SHIELD | Admitting: Family Medicine

## 2018-07-14 VITALS — BP 121/81 | HR 96 | Temp 98.3°F | Ht 62.0 in | Wt 160.0 lb

## 2018-07-14 DIAGNOSIS — E559 Vitamin D deficiency, unspecified: Secondary | ICD-10-CM

## 2018-07-14 DIAGNOSIS — Z9189 Other specified personal risk factors, not elsewhere classified: Secondary | ICD-10-CM | POA: Diagnosis not present

## 2018-07-14 DIAGNOSIS — K760 Fatty (change of) liver, not elsewhere classified: Secondary | ICD-10-CM | POA: Insufficient documentation

## 2018-07-14 DIAGNOSIS — E669 Obesity, unspecified: Secondary | ICD-10-CM

## 2018-07-14 DIAGNOSIS — R7303 Prediabetes: Secondary | ICD-10-CM | POA: Diagnosis not present

## 2018-07-14 DIAGNOSIS — Z6830 Body mass index (BMI) 30.0-30.9, adult: Secondary | ICD-10-CM

## 2018-07-14 MED ORDER — METFORMIN HCL 500 MG PO TABS: 500.0000 mg | ORAL_TABLET | Freq: Every day | ORAL | 0 refills | Status: DC

## 2018-07-14 MED ORDER — VITAMIN D (ERGOCALCIFEROL) 1.25 MG (50000 UNIT) PO CAPS: 50000.0000 [IU] | ORAL_CAPSULE | ORAL | 0 refills | Status: DC

## 2018-07-14 NOTE — Progress Notes (Signed)
Office: [phone]  /  Fax: [phone]   HPI:   Chief Complaint: OBESITY Heather Moreno is here to discuss her progress with her obesity treatment plan. She is on the keep a food journal with 1400-1500 calories and 90+ grams of protein daily and is following her eating plan approximately 75 % of the time. She states she is exercising 0 minutes 0 times per week. Heather Moreno has done well with weight loss. She journaled some but struggled to journal everything. Hunger was controlled and she did well with increased lean protein.  Her weight is 160 lb (72.6 kg) today and has had a weight loss of 1 pound over a period of 2 weeks since her last visit. She has lost 1 lb since starting treatment with [place].  Vitamin D Deficiency Heather Moreno has a diagnosis of vitamin D deficiency. Vit D is low, she is not on Vit D currently. She notes fatigue and denies nausea, vomiting or muscle weakness.  Pre-Diabetes Heather Moreno has a diagnosis of pre-diabetes based on her elevated Hgb A1c and was informed this puts her at greater risk of developing diabetes. A1c at 6.3 and elevated fasting glucose and insulin. She has a strong family history of diabetes mellitus. She did well with decreasing simple carbohydrates and continues to work on diet and exercise to decrease risk of diabetes. She denies nausea or hypoglycemia.  At risk for diabetes Heather Moreno is at higher than average risk for developing diabetes due to her obesity and pre-diabetes. She currently denies polyuria or polydipsia.  Non-Alcoholic Fatty Liver Disease Heather Moreno's ALT slightly elevated today. She denies abdominal pain or jaundice, she has a history of NAFLD and wants to improve with diet. She denies excessive alcohol intake.  ALLERGIES: Allergies  Allergen Reactions  . Aspirin Other (See Comments)    rhinitis  . Amoxicillin Rash    MEDICATIONS: No current outpatient medications on file prior to visit.   No current facility-administered medications on file prior to  visit.     PAST MEDICAL HISTORY: Past Medical History:  Diagnosis Date  . Allergic rhinitis   . Complication of anesthesia    difficulty with weakness after spinal  . Environmental allergies   . GERD (gastroesophageal reflux disease)   . Headache(784.0)   . HLD (hyperlipidemia)   . IBS (irritable bowel syndrome)   . Lactose intolerance   . Migraine headache with aura   . Vitamin D deficiency     PAST SURGICAL HISTORY: Past Surgical History:  Procedure Laterality Date  . cesarean sections  2009, 2003  . NASAL SINUS SURGERY Bilateral 1989    SOCIAL HISTORY: Social History   Tobacco Use  . Smoking status: Never Smoker  . Smokeless tobacco: Never Used  Substance Use Topics  . Alcohol use: Not on file  . Drug use: Not on file    FAMILY HISTORY: Family History  Problem Relation Age of Onset  . Diabetes Mother   . Hypertension Mother   . Hyperlipidemia Mother   . Thyroid disease Mother   . Diabetes Father   . Hypertension Father   . Hyperlipidemia Father   . Stroke Father   . Heart disease Father     ROS: Review of Systems  Constitutional: Positive for malaise/fatigue and weight loss.  Eyes:       Negative jaundice  Gastrointestinal: Negative for abdominal pain, nausea and vomiting.  Genitourinary: Negative for frequency.  Musculoskeletal:       Negative muscle weakness  Endo/Heme/Allergies: Negative for polydipsia.  Negative muscle weakness    PHYSICAL EXAM: Blood pressure 121/81, pulse 96, temperature 98.3 F (36.8 C), temperature source Oral, height 5\' 2"  (1.575 m), weight 160 lb (72.6 kg), SpO2 97 %. Body mass index is 29.26 kg/m. Physical Exam  Constitutional: She is oriented to person, place, and time. She appears well-developed and well-nourished.  Cardiovascular: Normal rate.  Pulmonary/Chest: Effort normal.  Musculoskeletal: Normal range of motion.  Neurological: She is oriented to person, place, and time.  Skin: Skin is warm and dry.    Psychiatric: She has a normal mood and affect. Her behavior is normal.  Vitals reviewed.   RECENT LABS AND TESTS: BMET    Component Value Date/Time   NA 138 06/30/2018 0937   K 4.5 06/30/2018 0937   CL 98 06/30/2018 0937   CO2 22 06/30/2018 0937   GLUCOSE 102 (H) 06/30/2018 0937   BUN 10 06/30/2018 0937   CREATININE 0.89 06/30/2018 0937   CALCIUM 9.5 06/30/2018 0937   GFRNONAA 78 06/30/2018 0937   GFRAA 90 06/30/2018 0937   Lab Results  Component Value Date   HGBA1C 6.3 (H) 06/30/2018   Lab Results  Component Value Date   INSULIN 25.3 (H) 06/30/2018   CBC    Component Value Date/Time   WBC 7.6 06/30/2018 0937   RBC 4.84 06/30/2018 0937   HGB 14.1 06/30/2018 0937   HCT 43.7 06/30/2018 0937   MCV 90 06/30/2018 0937   MCH 29.1 06/30/2018 0937   MCHC 32.3 06/30/2018 0937   RDW 13.9 06/30/2018 0937   LYMPHSABS 1.8 06/30/2018 0937   EOSABS 0.4 06/30/2018 0937   BASOSABS 0.1 06/30/2018 0937   Iron/TIBC/Ferritin/ %Sat No results found for: IRON, TIBC, FERRITIN, IRONPCTSAT Lipid Panel     Component Value Date/Time   CHOL 232 (H) 06/30/2018 0937   TRIG 130 06/30/2018 0937   HDL 72 06/30/2018 0937   LDLCALC 134 (H) 06/30/2018 0937   Hepatic Function Panel     Component Value Date/Time   PROT 7.4 06/30/2018 0937   ALBUMIN 4.7 06/30/2018 0937   AST 25 06/30/2018 0937   ALT 50 (H) 06/30/2018 0937   ALKPHOS 91 06/30/2018 0937   BILITOT 0.4 06/30/2018 0937      Component Value Date/Time   TSH 2.170 06/30/2018 0937  Results for Heather FoleyHAR, Heather Moreno (MRN [id number]) as of 07/14/2018 16:06  Ref. Range 06/30/2018 09:37  Vitamin D, 25-Hydroxy Latest Ref Range: 30.0 - 100.0 ng/mL 13.6 (Heather Moreno)    ASSESSMENT AND PLAN: Vitamin D deficiency - Plan: Vitamin D, Ergocalciferol, (DRISDOL) 50000 units CAPS capsule  Prediabetes - Plan: metFORMIN (GLUCOPHAGE) 500 MG tablet  NAFLD (nonalcoholic fatty liver disease)  At risk for diabetes mellitus  Class 1 obesity with serious  comorbidity and body mass index (BMI) of 30.0 to 30.9 in adult, unspecified obesity type - Starting BMI greater then 30  PLAN:  Vitamin D Deficiency Heather Moreno was informed that low vitamin D levels contributes to fatigue and are associated with obesity, breast, and colon cancer. Heather Moreno agrees to start prescription Vit D @50 ,000 IU every week #4 with no refills. She will follow up for routine testing of vitamin D, at least 2-3 times per year. She was informed of the risk of over-replacement of vitamin D and agrees to not increase her dose unless she discusses this with us first. Heather Moreno agrees to follow up with our clinic in 4 weeks.  Pre-Diabetes Heather Moreno will continue to work on weight loss, exercise, and decreasing simple carbohydrates in her  diet to help decrease the risk of diabetes. We dicussed metformin including benefits and risks. She was informed that eating too many simple carbohydrates or too many calories at one sitting increases the likelihood of GI side effects. Heather Moreno agrees to start metformin 500 mg q AM #30 with no refills. Heather Moreno agrees to follow up with our clinic in 4 weeks as directed to monitor her progress.  Diabetes risk counselling Heather Moreno was given extended (30 minutes) diabetes prevention counseling today. She is 47 y.o. female and has risk factors for diabetes including obesity and pre-diabetes. We discussed intensive lifestyle modifications today with an emphasis on weight loss as well as increasing exercise and decreasing simple carbohydrates in her diet.  Non-Alcoholic Fatty Liver Disease We discussed the likely diagnosis of non alcoholic fatty liver disease today and how this condition is obesity related. Heather Moreno was educated on her risk of developing NASH or even liver failure and th only proven treatment for NAFLD was weight loss. Aurianna agreed to continue with her weight loss efforts with healthier diet and exercise as an essential part of her treatment plan. We will recheck labs in 3  months and Heather Moreno agrees to follow up with our clinic in 4 weeks.  Obesity Heather Moreno is currently in the action stage of change. As such, her goal is to continue with weight loss efforts She has agreed to keep a food journal with 1400-1500 calories and 90+ grams of protein daily Heather Moreno has been instructed to work up to a goal of 150 minutes of combined cardio and strengthening exercise per week for weight loss and overall health benefits. We discussed the following Behavioral Modification Strategies today: increasing lean protein intake and decreasing simple carbohydrates    Heather Moreno has agreed to follow up with our clinic in 4 weeks. She was informed of the importance of frequent follow up visits to maximize her success with intensive lifestyle modifications for her multiple health conditions.   OBESITY BEHAVIORAL INTERVENTION VISIT  Today's visit was # 2 out of 22.  Starting weight: 161 lbs Starting date: 06/30/18 Today's weight : 160 lbs Today's date: 07/14/2018 Total lbs lost to date: 1    ASK: We discussed the diagnosis of obesity with Heather Moreno Moreno today and Heather Moreno agreed to give us permission to discuss obesity behavioral modification therapy today.  ASSESS: Heather Moreno has the diagnosis of obesity and her BMI today is 29.26 Heather Moreno is in the action stage of change   ADVISE: Heather Moreno was educated on the multiple health risks of obesity as well as the benefit of weight loss to improve her health. She was advised of the need for long term treatment and the importance of lifestyle modifications.  AGREE: Multiple dietary modification options and treatment options were discussed and  Heather Moreno agreed to the above obesity treatment plan.  I, Burt KnackSharon Martin, am acting as transcriptionist for Quillian Quincearen Deysi Soldo, MD  I have reviewed the above documentation for accuracy and completeness, and I agree with the above. -Quillian Quincearen Kaydon Creedon, MD

## 2018-07-26 DIAGNOSIS — Z0000 Encounter for general adult medical examination without abnormal findings: Secondary | ICD-10-CM | POA: Diagnosis not present

## 2018-08-02 ENCOUNTER — Other Ambulatory Visit (INDEPENDENT_AMBULATORY_CARE_PROVIDER_SITE_OTHER): Payer: Self-pay | Admitting: Family Medicine

## 2018-08-02 DIAGNOSIS — E559 Vitamin D deficiency, unspecified: Secondary | ICD-10-CM

## 2018-08-04 ENCOUNTER — Other Ambulatory Visit (INDEPENDENT_AMBULATORY_CARE_PROVIDER_SITE_OTHER): Payer: Self-pay | Admitting: Family Medicine

## 2018-08-04 DIAGNOSIS — R7303 Prediabetes: Secondary | ICD-10-CM

## 2018-08-11 ENCOUNTER — Ambulatory Visit (INDEPENDENT_AMBULATORY_CARE_PROVIDER_SITE_OTHER): Payer: BLUE CROSS/BLUE SHIELD | Admitting: Family Medicine

## 2018-08-11 VITALS — BP 121/72 | HR 89 | Temp 98.4°F | Ht 62.0 in | Wt 157.0 lb

## 2018-08-11 DIAGNOSIS — E669 Obesity, unspecified: Secondary | ICD-10-CM

## 2018-08-11 DIAGNOSIS — R7303 Prediabetes: Secondary | ICD-10-CM | POA: Diagnosis not present

## 2018-08-11 DIAGNOSIS — Z9189 Other specified personal risk factors, not elsewhere classified: Secondary | ICD-10-CM | POA: Diagnosis not present

## 2018-08-11 DIAGNOSIS — E559 Vitamin D deficiency, unspecified: Secondary | ICD-10-CM

## 2018-08-11 DIAGNOSIS — Z6830 Body mass index (BMI) 30.0-30.9, adult: Secondary | ICD-10-CM

## 2018-08-11 DIAGNOSIS — E66811 Obesity, class 1: Secondary | ICD-10-CM

## 2018-08-11 MED ORDER — METFORMIN HCL 500 MG PO TABS: 500.0000 mg | ORAL_TABLET | Freq: Every day | ORAL | 0 refills | Status: DC

## 2018-08-11 MED ORDER — VITAMIN D (ERGOCALCIFEROL) 1.25 MG (50000 UNIT) PO CAPS: 50000.0000 [IU] | ORAL_CAPSULE | ORAL | 0 refills | Status: DC

## 2018-08-16 NOTE — Progress Notes (Signed)
Office: [phone]  /  Fax: [phone]   HPI:   Chief Complaint: OBESITY Heather Moreno is here to discuss her progress with her obesity treatment plan. She is on the keep a food journal with 1400-1500 calories and 90+ grams of protein daily and is following her eating plan approximately 80 % of the time. She states she is on the elliptical for 20 minutes 5 times per week. Heather Moreno continues to do well with weight loss. She is journaling on and off and she is working on increasing lean protein and decreasing carbohydrates. Family is mostly supportive.  Her weight is 157 lb (71.2 kg) today and has had a weight loss of 3 pounds over a period of 4 weeks since her last visit. She has lost 4 lbs since starting treatment with us.  Vitamin D Deficiency Heather Moreno has a diagnosis of vitamin D deficiency. She is stable on prescription Vit D, level is not yet at goal. She denies nausea, vomiting or muscle weakness.  Pre-Diabetes Heather Moreno has a diagnosis of pre-diabetes based on her elevated Hgb A1c and was informed this puts her at greater risk of developing diabetes. She is stable on metformin, she notes polyphagia has improved and denies nausea, vomiting, or hypoglycemia. She continues to work on diet and exercise to decrease risk of diabetes.  At risk for diabetes Heather Moreno is at higher than average risk for developing diabetes due to her obesity and pre-diabetes. She currently denies polyuria or polydipsia.  ALLERGIES: Allergies  Allergen Reactions  . Aspirin Other (See Comments)    rhinitis  . Amoxicillin Rash    MEDICATIONS: No current outpatient medications on file prior to visit.   No current facility-administered medications on file prior to visit.     PAST MEDICAL HISTORY: Past Medical History:  Diagnosis Date  . Allergic rhinitis   . Complication of anesthesia    difficulty with weakness after spinal  . Environmental allergies   . GERD (gastroesophageal reflux disease)   . Headache(784.0)     . HLD (hyperlipidemia)   . IBS (irritable bowel syndrome)   . Lactose intolerance   . Migraine headache with aura   . Vitamin D deficiency     PAST SURGICAL HISTORY: Past Surgical History:  Procedure Laterality Date  . cesarean sections  2009, 2003  . NASAL SINUS SURGERY Bilateral 1989    SOCIAL HISTORY: Social History   Tobacco Use  . Smoking status: Never Smoker  . Smokeless tobacco: Never Used  Substance Use Topics  . Alcohol use: Not on file  . Drug use: Not on file    FAMILY HISTORY: Family History  Problem Relation Age of Onset  . Diabetes Mother   . Hypertension Mother   . Hyperlipidemia Mother   . Thyroid disease Mother   . Diabetes Father   . Hypertension Father   . Hyperlipidemia Father   . Stroke Father   . Heart disease Father     ROS: Review of Systems  Constitutional: Positive for weight loss.  Gastrointestinal: Negative for nausea and vomiting.  Genitourinary: Negative for frequency.  Musculoskeletal:       Negative muscle weakness  Endo/Heme/Allergies: Negative for polydipsia.       Positive polyphagia Negative hypoglycemia    PHYSICAL EXAM: Blood pressure 121/72, pulse 89, temperature 98.4 F (36.9 C), temperature source Oral, height 5\' 2"  (1.575 m), weight 157 lb (71.2 kg), SpO2 97 %. Body mass index is 28.72 kg/m. Physical Exam  Constitutional: She is oriented to person,  place, and time. She appears well-developed and well-nourished.  Cardiovascular: Normal rate.  Pulmonary/Chest: Effort normal.  Musculoskeletal: Normal range of motion.  Neurological: She is oriented to person, place, and time.  Skin: Skin is warm and dry.  Psychiatric: She has a normal mood and affect. Her behavior is normal.  Vitals reviewed.   RECENT LABS AND TESTS: BMET    Component Value Date/Time   NA 138 06/30/2018 0937   K 4.5 06/30/2018 0937   CL 98 06/30/2018 0937   CO2 22 06/30/2018 0937   GLUCOSE 102 (H) 06/30/2018 0937   BUN 10 06/30/2018  0937   CREATININE 0.89 06/30/2018 0937   CALCIUM 9.5 06/30/2018 0937   GFRNONAA 78 06/30/2018 0937   GFRAA 90 06/30/2018 0937   Lab Results  Component Value Date   HGBA1C 6.3 (H) 06/30/2018   Lab Results  Component Value Date   INSULIN 25.3 (H) 06/30/2018   CBC    Component Value Date/Time   WBC 7.6 06/30/2018 0937   RBC 4.84 06/30/2018 0937   HGB 14.1 06/30/2018 0937   HCT 43.7 06/30/2018 0937   MCV 90 06/30/2018 0937   MCH 29.1 06/30/2018 0937   MCHC 32.3 06/30/2018 0937   RDW 13.9 06/30/2018 0937   LYMPHSABS 1.8 06/30/2018 0937   EOSABS 0.4 06/30/2018 0937   BASOSABS 0.1 06/30/2018 0937   Iron/TIBC/Ferritin/ %Sat No results found for: IRON, TIBC, FERRITIN, IRONPCTSAT Lipid Panel     Component Value Date/Time   CHOL 232 (H) 06/30/2018 0937   TRIG 130 06/30/2018 0937   HDL 72 06/30/2018 0937   LDLCALC 134 (H) 06/30/2018 0937   Hepatic Function Panel     Component Value Date/Time   PROT 7.4 06/30/2018 0937   ALBUMIN 4.7 06/30/2018 0937   AST 25 06/30/2018 0937   ALT 50 (H) 06/30/2018 0937   ALKPHOS 91 06/30/2018 0937   BILITOT 0.4 06/30/2018 0937      Component Value Date/Time   TSH 2.170 06/30/2018 0937  Results for Heather Moreno (MRN [id number]) as of 08/16/2018 10:29  Ref. Range 06/30/2018 09:37  Vitamin D, 25-Hydroxy Latest Ref Range: 30.0 - 100.0 ng/mL 13.6 (L)    ASSESSMENT AND PLAN: Prediabetes - Plan: metFORMIN (GLUCOPHAGE) 500 MG tablet  Vitamin D deficiency - Plan: Vitamin D, Ergocalciferol, (DRISDOL) 50000 units CAPS capsule  At risk for diabetes mellitus  Class 1 obesity with serious comorbidity and body mass index (BMI) of 30.0 to 30.9 in adult, unspecified obesity type - Starting BMI greater then 30  PLAN:  Vitamin D Deficiency Heather Moreno was informed that low vitamin D levels contributes to fatigue and are associated with obesity, breast, and colon cancer. Heather Moreno agrees to continue taking prescription Vit D @50 ,000 IU every week #4 and we will  refill for 1 month. She will follow up for routine testing of vitamin D, at least 2-3 times per year. She was informed of the risk of over-replacement of vitamin D and agrees to not increase her dose unless she discusses this with [place] first. Heather Moreno agrees to follow up with our clinic in 4 weeks.  Pre-Diabetes Heather Moreno will continue to work on weight loss, exercise, and decreasing simple carbohydrates in her diet to help decrease the risk of diabetes. We dicussed metformin including benefits and risks. She was informed that eating too many simple carbohydrates or too many calories at one sitting increases the likelihood of GI side effects. Heather Moreno agrees to continue taking metformin 500 mg q AM #30 and we  will refill for 1 month. Heather Moreno agrees to follow up with our clinic in 4 weeks as directed to monitor her progress.  Diabetes risk counselling Heather Moreno was given extended (15 minutes) diabetes prevention counseling today. She is 47 y.o. female and has risk factors for diabetes including obesity and pre-diabetes. We discussed intensive lifestyle modifications today with an emphasis on weight loss as well as increasing exercise and decreasing simple carbohydrates in her diet.  Obesity Zakkiyya is currently in the action stage of change. As such, her goal is to continue with weight loss efforts She has agreed to keep a food journal with 1400-1500 calories and 90+ grams of protein daily Advika has been instructed to work up to a goal of 150 minutes of combined cardio and strengthening exercise per week for weight loss and overall health benefits. We discussed the following Behavioral Modification Strategies today: increasing lean protein intake, decreasing simple carbohydrates  and work on meal planning and easy cooking plans   Gavyn has agreed to follow up with our clinic in 4 weeks. She was informed of the importance of frequent follow up visits to maximize her success with intensive lifestyle modifications for her  multiple health conditions.   OBESITY BEHAVIORAL INTERVENTION VISIT  Today's visit was # 3   Starting weight: 161 lbs Starting date: 06/30/18 Today's weight : 157 lbs  Today's date: 08/11/2018 Total lbs lost to date: 4    ASK: We discussed the diagnosis of obesity with [name] today and Natalie agreed to give [place] permission to discuss obesity behavioral modification therapy today.  ASSESS: Shalyn has the diagnosis of obesity and her BMI today is 28.71 Vannie is in the action stage of change   ADVISE: Jaanvi was educated on the multiple health risks of obesity as well as the benefit of weight loss to improve her health. She was advised of the need for long term treatment and the importance of lifestyle modifications to improve her current health and to decrease her risk of future health problems.  AGREE: Multiple dietary modification options and treatment options were discussed and  Saga agreed to follow the recommendations documented in the above note.  ARRANGE: Juanetta was educated on the importance of frequent visits to treat obesity as outlined per CMS and USPSTF guidelines and agreed to schedule her next follow up appointment today.  I, Burt Knack, am acting as transcriptionist for Quillian Quince, MD  I have reviewed the above documentation for accuracy and completeness, and I agree with the above. -Quillian Quince, MD

## 2018-09-01 ENCOUNTER — Other Ambulatory Visit (INDEPENDENT_AMBULATORY_CARE_PROVIDER_SITE_OTHER): Payer: Self-pay | Admitting: Family Medicine

## 2018-09-01 DIAGNOSIS — E559 Vitamin D deficiency, unspecified: Secondary | ICD-10-CM

## 2018-09-03 ENCOUNTER — Other Ambulatory Visit (INDEPENDENT_AMBULATORY_CARE_PROVIDER_SITE_OTHER): Payer: Self-pay | Admitting: Family Medicine

## 2018-09-03 DIAGNOSIS — R7303 Prediabetes: Secondary | ICD-10-CM

## 2018-09-08 ENCOUNTER — Ambulatory Visit (INDEPENDENT_AMBULATORY_CARE_PROVIDER_SITE_OTHER): Payer: BLUE CROSS/BLUE SHIELD | Admitting: Family Medicine

## 2018-09-08 VITALS — BP 115/74 | HR 80 | Temp 98.5°F | Ht 62.0 in | Wt 156.0 lb

## 2018-09-08 DIAGNOSIS — E669 Obesity, unspecified: Secondary | ICD-10-CM

## 2018-09-08 DIAGNOSIS — E559 Vitamin D deficiency, unspecified: Secondary | ICD-10-CM

## 2018-09-08 DIAGNOSIS — Z9189 Other specified personal risk factors, not elsewhere classified: Secondary | ICD-10-CM

## 2018-09-08 DIAGNOSIS — R7303 Prediabetes: Secondary | ICD-10-CM | POA: Diagnosis not present

## 2018-09-08 DIAGNOSIS — Z6830 Body mass index (BMI) 30.0-30.9, adult: Secondary | ICD-10-CM

## 2018-09-08 MED ORDER — VITAMIN D (ERGOCALCIFEROL) 1.25 MG (50000 UNIT) PO CAPS: 50000.0000 [IU] | ORAL_CAPSULE | ORAL | 0 refills | Status: DC

## 2018-09-08 MED ORDER — METFORMIN HCL 500 MG PO TABS: 500.0000 mg | ORAL_TABLET | Freq: Every day | ORAL | 0 refills | Status: DC

## 2018-09-13 NOTE — Progress Notes (Signed)
Office: [phone]  /  Fax: [phone]   HPI:   Chief Complaint: OBESITY Heather Moreno is here to discuss her progress with her obesity treatment plan. She is on the keep a food journal with 1400-1500 calories and 90+ grams of protein daily and is following her eating plan approximately 80 % of the time. She states she is walking for 20-40 minutes 1-2 times per week. Heather Moreno continues to do well with weight loss but is struggling with the journaling. She is somewhat frustrated at how difficult weight loss is.  Her weight is 156 lb (70.8 kg) today and has had a weight loss of 1 pound over a period of 4 weeks since her last visit. She has lost 5 lbs since starting treatment with [place].  Vitamin D Deficiency Heather Moreno has a diagnosis of vitamin D deficiency. She is stable on prescription Vit D, but level is not yet at goal. She denies nausea, vomiting or muscle weakness.  Pre-Diabetes Heather Moreno has a diagnosis of pre-diabetes based on her elevated Hgb A1c and was informed this puts her at greater risk of developing diabetes. She is doing well on diet and metformin and continues to work on exercise to decrease risk of diabetes. She denies nausea, vomiting, or hypoglycemia.  At risk for diabetes Heather Moreno is at higher than average risk for developing diabetes due to her obesity and pre-diabetes. She currently denies polyuria or polydipsia.  ALLERGIES: Allergies  Allergen Reactions  . Aspirin Other (See Comments)    rhinitis  . Amoxicillin Rash    MEDICATIONS: No current outpatient medications on file prior to visit.   No current facility-administered medications on file prior to visit.     PAST MEDICAL HISTORY: Past Medical History:  Diagnosis Date  . Allergic rhinitis   . Complication of anesthesia    difficulty with weakness after spinal  . Environmental allergies   . GERD (gastroesophageal reflux disease)   . Headache(784.0)   . HLD (hyperlipidemia)   . IBS (irritable bowel syndrome)   .  Lactose intolerance   . Migraine headache with aura   . Vitamin D deficiency     PAST SURGICAL HISTORY: Past Surgical History:  Procedure Laterality Date  . cesarean sections  2009, 2003  . NASAL SINUS SURGERY Bilateral 1989    SOCIAL HISTORY: Social History   Tobacco Use  . Smoking status: Never Smoker  . Smokeless tobacco: Never Used  Substance Use Topics  . Alcohol use: Not on file  . Drug use: Not on file    FAMILY HISTORY: Family History  Problem Relation Age of Onset  . Diabetes Mother   . Hypertension Mother   . Hyperlipidemia Mother   . Thyroid disease Mother   . Diabetes Father   . Hypertension Father   . Hyperlipidemia Father   . Stroke Father   . Heart disease Father     ROS: Review of Systems  Constitutional: Positive for weight loss.  Gastrointestinal: Negative for nausea and vomiting.  Genitourinary: Negative for frequency.  Musculoskeletal:       Negative muscle weakness  Endo/Heme/Allergies: Negative for polydipsia.       Negative hypoglycemia    PHYSICAL EXAM: Blood pressure 115/74, pulse 80, temperature 98.5 F (36.9 C), height 5\' 2"  (1.575 m), weight 156 lb (70.8 kg), SpO2 98 %. Body mass index is 28.53 kg/m. Physical Exam  Constitutional: She is oriented to person, place, and time. She appears well-developed and well-nourished.  Cardiovascular: Normal rate.  Pulmonary/Chest: Effort normal.  Musculoskeletal: Normal range of motion.  Neurological: She is oriented to person, place, and time.  Skin: Skin is warm and dry.  Psychiatric: She has a normal mood and affect. Her behavior is normal.  Vitals reviewed.   RECENT LABS AND TESTS: BMET    Component Value Date/Time   NA 138 06/30/2018 0937   K 4.5 06/30/2018 0937   CL 98 06/30/2018 0937   CO2 22 06/30/2018 0937   GLUCOSE 102 (H) 06/30/2018 0937   BUN 10 06/30/2018 0937   CREATININE 0.89 06/30/2018 0937   CALCIUM 9.5 06/30/2018 0937   GFRNONAA 78 06/30/2018 0937   GFRAA 90  06/30/2018 0937   Lab Results  Component Value Date   HGBA1C 6.3 (H) 06/30/2018   Lab Results  Component Value Date   INSULIN 25.3 (H) 06/30/2018   CBC    Component Value Date/Time   WBC 7.6 06/30/2018 0937   RBC 4.84 06/30/2018 0937   HGB 14.1 06/30/2018 0937   HCT 43.7 06/30/2018 0937   MCV 90 06/30/2018 0937   MCH 29.1 06/30/2018 0937   MCHC 32.3 06/30/2018 0937   RDW 13.9 06/30/2018 0937   LYMPHSABS 1.8 06/30/2018 0937   EOSABS 0.4 06/30/2018 0937   BASOSABS 0.1 06/30/2018 0937   Iron/TIBC/Ferritin/ %Sat No results found for: IRON, TIBC, FERRITIN, IRONPCTSAT Lipid Panel     Component Value Date/Time   CHOL 232 (H) 06/30/2018 0937   TRIG 130 06/30/2018 0937   HDL 72 06/30/2018 0937   LDLCALC 134 (H) 06/30/2018 0937   Hepatic Function Panel     Component Value Date/Time   PROT 7.4 06/30/2018 0937   ALBUMIN 4.7 06/30/2018 0937   AST 25 06/30/2018 0937   ALT 50 (H) 06/30/2018 0937   ALKPHOS 91 06/30/2018 0937   BILITOT 0.4 06/30/2018 0937      Component Value Date/Time   TSH 2.170 06/30/2018 0937  Results for TORIANNA, JUNIO (MRN [id number]) as of 09/13/2018 08:17  Ref. Range 06/30/2018 09:37  Vitamin D, 25-Hydroxy Latest Ref Range: 30.0 - 100.0 ng/mL 13.6 (L)    ASSESSMENT AND PLAN: Vitamin D deficiency - Plan: Vitamin D, Ergocalciferol, (DRISDOL) 50000 units CAPS capsule  Prediabetes - Plan: metFORMIN (GLUCOPHAGE) 500 MG tablet  At risk for diabetes mellitus  Class 1 obesity with serious comorbidity and body mass index (BMI) of 30.0 to 30.9 in adult, unspecified obesity type - BMI greater than 30 at start of program   PLAN:  Vitamin D Deficiency Heather Moreno was informed that low vitamin D levels contributes to fatigue and are associated with obesity, breast, and colon cancer. Heather Moreno agrees to continue taking prescription Vit D @50 ,000 IU every week #4 and we will refill for 1 month. She will follow up for routine testing of vitamin D, at least 2-3 times per year.  She was informed of the risk of over-replacement of vitamin D and agrees to not increase her dose unless she discusses this with [place] first. Heather Moreno agrees to follow up with our clinic in 4 weeks.  Pre-Diabetes Heather Moreno will continue to work on weight loss, diet, exercise, and decreasing simple carbohydrates in her diet to help decrease the risk of diabetes. We dicussed metformin including benefits and risks. She was informed that eating too many simple carbohydrates or too many calories at one sitting increases the likelihood of GI side effects. Heather Moreno agrees to continue taking metformin 500 mg q AM #30 and we will refill for 1 month. Heather Moreno agrees to follow up with our  clinic in 4 weeks as directed to monitor her progress.  Diabetes risk counselling Heather Moreno was given extended (15 minutes) diabetes prevention counseling today. She is 47 y.o. female and has risk factors for diabetes including obesity and pre-diabetes. We discussed intensive lifestyle modifications today with an emphasis on weight loss as well as increasing exercise and decreasing simple carbohydrates in her diet.  Obesity Heather Moreno is currently in the action stage of change. As such, her goal is to continue with weight loss efforts She has agreed to keep a food journal with 1400-1500 calories and 90+ grams of protein daily Heather Moreno has been instructed to work up to a goal of 150 minutes of combined cardio and strengthening exercise per week for weight loss and overall health benefits. We discussed the following Behavioral Modification Strategies today: increasing lean protein intake, decreasing simple carbohydrates, work on meal planning and easy cooking plans, and travel eating strategies  We discussed ultimate goals and how to weigh the benefits if additional weight loss as she is at a very healthy weight for her currently. Heather Moreno will consider this and we will discuss this further at her next visit.  Heather Moreno has agreed to follow up with our clinic in  4 weeks. She was informed of the importance of frequent follow up visits to maximize her success with intensive lifestyle modifications for her multiple health conditions.   OBESITY BEHAVIORAL INTERVENTION VISIT  Today's visit was # 4   Starting weight: 161 lbs Starting date: 06/30/18 Today's weight : 156 lbs  Today's date: 09/08/2018 Total lbs lost to date: 5    ASK: We discussed the diagnosis of obesity with Heather Moreno today and Heather Moreno agreed to give [place] permission to discuss obesity behavioral modification therapy today.  ASSESS: Heather Moreno has the diagnosis of obesity and her BMI today is 28.53 Heather Moreno is in the action stage of change   ADVISE: Heather Moreno was educated on the multiple health risks of obesity as well as the benefit of weight loss to improve her health. She was advised of the need for long term treatment and the importance of lifestyle modifications to improve her current health and to decrease her risk of future health problems.  AGREE: Multiple dietary modification options and treatment options were discussed and  Heather Moreno agreed to follow the recommendations documented in the above note.  ARRANGE: Delane was educated on the importance of frequent visits to treat obesity as outlined per CMS and USPSTF guidelines and agreed to schedule her next follow up appointment today.  I, Burt Knack, am acting as transcriptionist for Quillian Quince, MD  I have reviewed the above documentation for accuracy and completeness, and I agree with the above. -Quillian Quince, MD

## 2018-09-26 ENCOUNTER — Other Ambulatory Visit (INDEPENDENT_AMBULATORY_CARE_PROVIDER_SITE_OTHER): Payer: Self-pay | Admitting: Family Medicine

## 2018-09-26 DIAGNOSIS — E559 Vitamin D deficiency, unspecified: Secondary | ICD-10-CM

## 2018-09-28 ENCOUNTER — Other Ambulatory Visit (INDEPENDENT_AMBULATORY_CARE_PROVIDER_SITE_OTHER): Payer: Self-pay | Admitting: Family Medicine

## 2018-09-28 DIAGNOSIS — R7303 Prediabetes: Secondary | ICD-10-CM

## 2018-09-30 ENCOUNTER — Other Ambulatory Visit (INDEPENDENT_AMBULATORY_CARE_PROVIDER_SITE_OTHER): Payer: Self-pay | Admitting: Family Medicine

## 2018-09-30 DIAGNOSIS — R7303 Prediabetes: Secondary | ICD-10-CM

## 2018-10-06 ENCOUNTER — Ambulatory Visit (INDEPENDENT_AMBULATORY_CARE_PROVIDER_SITE_OTHER): Payer: BLUE CROSS/BLUE SHIELD | Admitting: Family Medicine

## 2018-10-06 VITALS — BP 111/73 | HR 89 | Temp 97.9°F | Ht 62.0 in | Wt 153.0 lb

## 2018-10-06 DIAGNOSIS — Z9189 Other specified personal risk factors, not elsewhere classified: Secondary | ICD-10-CM

## 2018-10-06 DIAGNOSIS — E559 Vitamin D deficiency, unspecified: Secondary | ICD-10-CM | POA: Diagnosis not present

## 2018-10-06 DIAGNOSIS — R7303 Prediabetes: Secondary | ICD-10-CM

## 2018-10-06 DIAGNOSIS — E669 Obesity, unspecified: Secondary | ICD-10-CM | POA: Diagnosis not present

## 2018-10-06 DIAGNOSIS — Z6830 Body mass index (BMI) 30.0-30.9, adult: Secondary | ICD-10-CM

## 2018-10-06 MED ORDER — METFORMIN HCL 500 MG PO TABS: 500.0000 mg | ORAL_TABLET | Freq: Every day | ORAL | 0 refills | Status: DC

## 2018-10-06 MED ORDER — VITAMIN D (ERGOCALCIFEROL) 1.25 MG (50000 UNIT) PO CAPS: 50000.0000 [IU] | ORAL_CAPSULE | ORAL | 0 refills | Status: DC

## 2018-10-10 NOTE — Progress Notes (Signed)
Office: [phone]  /  Fax: [phone]   HPI:   Chief Complaint: OBESITY Heather Moreno is here to discuss her progress with her obesity treatment plan. She is on the  keep a food journal with 1500 calories and 90 grams of protein and is following her eating plan approximately 75 % of the time. She states she is walking 30 to 45 minutes 3 to 4 times per week. Heather Moreno continues to do very well with weight loss, journaling, and working in lean protein and vegetables.  Her weight is 153 lb (69.4 kg) today and has had a weight loss of 3 pounds over a period of 4 weeks since her last visit. She has lost 8 lbs since starting treatment with [place].  Vitamin D deficiency Heather Moreno has a diagnosis of vitamin D deficiency. She is currently taking vit D and is stable, but not at goal. She denies nausea, vomiting, or muscle weakness.  Pre-Diabetes Heather Moreno has a diagnosis of pre-diabetes based on her elevated Hgb A1c and was informed this puts her at greater risk of developing diabetes. She is doing well on her diet prescription and taking metformin currently. She continues to work on diet and exercise to decrease risk of diabetes. She denies nausea, vomiting, or hypoglycemia.  At risk for diabetes Heather Moreno is at higher than average risk for developing diabetes due to her pre-diabetes and obesity. She currently denies polyuria or polydipsia.  ALLERGIES: Allergies  Allergen Reactions  . Aspirin Other (See Comments)    rhinitis  . Amoxicillin Rash    MEDICATIONS: No current outpatient medications on file prior to visit.   No current facility-administered medications on file prior to visit.     PAST MEDICAL HISTORY: Past Medical History:  Diagnosis Date  . Allergic rhinitis   . Complication of anesthesia    difficulty with weakness after spinal  . Environmental allergies   . GERD (gastroesophageal reflux disease)   . Headache(784.0)   . HLD (hyperlipidemia)   . IBS (irritable bowel syndrome)   . Lactose  intolerance   . Migraine headache with aura   . Vitamin D deficiency     PAST SURGICAL HISTORY: Past Surgical History:  Procedure Laterality Date  . cesarean sections  2009, 2003  . NASAL SINUS SURGERY Bilateral 1989    SOCIAL HISTORY: Social History   Tobacco Use  . Smoking status: Never Smoker  . Smokeless tobacco: Never Used  Substance Use Topics  . Alcohol use: Not on file  . Drug use: Not on file    FAMILY HISTORY: Family History  Problem Relation Age of Onset  . Diabetes Mother   . Hypertension Mother   . Hyperlipidemia Mother   . Thyroid disease Mother   . Diabetes Father   . Hypertension Father   . Hyperlipidemia Father   . Stroke Father   . Heart disease Father     ROS: Review of Systems  Constitutional: Positive for weight loss.  Gastrointestinal: Negative for nausea and vomiting.  Genitourinary:       Negative for polyuria.  Musculoskeletal: Negative for myalgias.  Endo/Heme/Allergies: Negative for polydipsia.       Negative for hypoglycemia.    PHYSICAL EXAM: Blood pressure 111/73, pulse 89, temperature 97.9 F (36.6 C), height 5\' 2"  (1.575 m), weight 153 lb (69.4 kg), SpO2 97 %. Body mass index is 27.98 kg/m. Physical Exam  Constitutional: She is oriented to person, place, and time. She appears well-developed and well-nourished.  Cardiovascular: Normal rate.  Pulmonary/Chest:  Effort normal.  Musculoskeletal: Normal range of motion.  Neurological: She is alert and oriented to person, place, and time.  Skin: Skin is warm and dry.  Psychiatric: She has a normal mood and affect.  Vitals reviewed.   RECENT LABS AND TESTS: BMET    Component Value Date/Time   NA 138 06/30/2018 0937   K 4.5 06/30/2018 0937   CL 98 06/30/2018 0937   CO2 22 06/30/2018 0937   GLUCOSE 102 (H) 06/30/2018 0937   BUN 10 06/30/2018 0937   CREATININE 0.89 06/30/2018 0937   CALCIUM 9.5 06/30/2018 0937   GFRNONAA 78 06/30/2018 0937   GFRAA 90 06/30/2018 0937    Lab Results  Component Value Date   HGBA1C 6.3 (H) 06/30/2018   Lab Results  Component Value Date   INSULIN 25.3 (H) 06/30/2018   CBC    Component Value Date/Time   WBC 7.6 06/30/2018 0937   RBC 4.84 06/30/2018 0937   HGB 14.1 06/30/2018 0937   HCT 43.7 06/30/2018 0937   MCV 90 06/30/2018 0937   MCH 29.1 06/30/2018 0937   MCHC 32.3 06/30/2018 0937   RDW 13.9 06/30/2018 0937   LYMPHSABS 1.8 06/30/2018 0937   EOSABS 0.4 06/30/2018 0937   BASOSABS 0.1 06/30/2018 0937   Iron/TIBC/Ferritin/ %Sat No results found for: IRON, TIBC, FERRITIN, IRONPCTSAT Lipid Panel     Component Value Date/Time   CHOL 232 (H) 06/30/2018 0937   TRIG 130 06/30/2018 0937   HDL 72 06/30/2018 0937   LDLCALC 134 (H) 06/30/2018 0937   Hepatic Function Panel     Component Value Date/Time   PROT 7.4 06/30/2018 0937   ALBUMIN 4.7 06/30/2018 0937   AST 25 06/30/2018 0937   ALT 50 (H) 06/30/2018 0937   ALKPHOS 91 06/30/2018 0937   BILITOT 0.4 06/30/2018 0937      Component Value Date/Time   TSH 2.170 06/30/2018 0937   Results for KATRIEL, CUTSFORTH (MRN [id number]) as of 10/10/2018 06:45  Ref. Range 06/30/2018 09:37  Vitamin D, 25-Hydroxy Latest Ref Range: 30.0 - 100.0 ng/mL 13.6 (L)   ASSESSMENT AND PLAN: Prediabetes - Plan: metFORMIN (GLUCOPHAGE) 500 MG tablet  Vitamin D deficiency - Plan: Vitamin D, Ergocalciferol, (DRISDOL) 1.25 MG (50000 UT) CAPS capsule  At risk for diabetes mellitus  Class 1 obesity with serious comorbidity and body mass index (BMI) of 30.0 to 30.9 in adult, unspecified obesity type - Starting BMI greater then 30  PLAN:  Vitamin D Deficiency Heather Moreno was informed that low vitamin D levels contributes to fatigue and are associated with obesity, breast, and colon cancer. She agrees to continue to take prescription Vit D @50 ,000 IU every week #4 with no refills and will follow up for routine testing of vitamin D, at least 2-3 times per year. She was informed of the risk of  over-replacement of vitamin D and agrees to not increase her dose unless she discusses this with [place] first. We will check labs in 1 month. Heather Moreno agrees to follow up in 4 weeks.  Diabetes risk counseling Heather Moreno was given extended (15 minutes) diabetes prevention counseling today. She is 47 y.o. female and has risk factors for diabetes including pre-diabetes and obesity. We discussed intensive lifestyle modifications today with an emphasis on weight loss as well as increasing exercise and decreasing simple carbohydrates in her diet.  Obesity Heather Moreno is currently in the action stage of change. As such, her goal is to continue with weight loss efforts. She has agreed to keep a  food journal with 1500 calories and 90 grams of protein. Heather Moreno has been instructed to work up to a goal of 150 minutes of combined cardio and strengthening exercise per week for weight loss and overall health benefits. We discussed the following Behavioral Modification Strategies today: increasing lean protein intake, decreasing simple carbohydrates , work on meal planning and easy cooking plans, dealing with family or coworker sabotage and holiday eating strategies, and travel eating strategies.  Heather Moreno has agreed to follow up with our clinic in 4 weeks. She was informed of the importance of frequent follow up visits to maximize her success with intensive lifestyle modifications for her multiple health conditions.   OBESITY BEHAVIORAL INTERVENTION VISIT  Today's visit was # 5   Starting weight: 161 lbs Starting date: 06/30/18 Today's weight : Weight: 153 lb (69.4 kg)  Today's date: 10/06/2018 Total lbs lost to date: 8   AS: We discussed the diagnosis of obesity with Heather Moreno today and Heather Moreno agreed to give [place] permission to discuss obesity behavioral modification therapy today.  ASSESS: Heather Moreno has the diagnosis of obesity and her BMI today is 27.98. Heather Moreno is in the action stage of change.   ADVISE: Heather Moreno was educated on  the multiple health risks of obesity as well as the benefit of weight loss to improve her health. She was advised of the need for long term treatment and the importance of lifestyle modifications to improve her current health and to decrease her risk of future health problems.  AGREE: Multiple dietary modification options and treatment options were discussed and Heather Moreno agreed to follow the recommendations documented in the above note.  ARRANGE: Heather Moreno was educated on the importance of frequent visits to treat obesity as outlined per CMS and USPSTF guidelines and agreed to schedule her next follow up appointment today.  I, Kirke Corin, am acting as transcriptionist for Wilder Glade, MD  I have reviewed the above documentation for accuracy and completeness, and I agree with the above. -Quillian Quince, MD

## 2018-10-30 ENCOUNTER — Other Ambulatory Visit (INDEPENDENT_AMBULATORY_CARE_PROVIDER_SITE_OTHER): Payer: Self-pay | Admitting: Family Medicine

## 2018-10-30 DIAGNOSIS — E559 Vitamin D deficiency, unspecified: Secondary | ICD-10-CM

## 2018-11-01 ENCOUNTER — Other Ambulatory Visit (INDEPENDENT_AMBULATORY_CARE_PROVIDER_SITE_OTHER): Payer: Self-pay | Admitting: Family Medicine

## 2018-11-01 DIAGNOSIS — R7303 Prediabetes: Secondary | ICD-10-CM

## 2018-11-07 ENCOUNTER — Ambulatory Visit (INDEPENDENT_AMBULATORY_CARE_PROVIDER_SITE_OTHER): Payer: BLUE CROSS/BLUE SHIELD | Admitting: Family Medicine

## 2018-11-07 ENCOUNTER — Encounter (INDEPENDENT_AMBULATORY_CARE_PROVIDER_SITE_OTHER): Payer: Self-pay | Admitting: Family Medicine

## 2018-11-07 VITALS — BP 101/67 | HR 90 | Temp 98.1°F | Ht 62.0 in | Wt 152.0 lb

## 2018-11-07 DIAGNOSIS — E7849 Other hyperlipidemia: Secondary | ICD-10-CM | POA: Diagnosis not present

## 2018-11-07 DIAGNOSIS — Z6830 Body mass index (BMI) 30.0-30.9, adult: Secondary | ICD-10-CM

## 2018-11-07 DIAGNOSIS — R7303 Prediabetes: Secondary | ICD-10-CM | POA: Diagnosis not present

## 2018-11-07 DIAGNOSIS — Z9189 Other specified personal risk factors, not elsewhere classified: Secondary | ICD-10-CM

## 2018-11-07 DIAGNOSIS — E559 Vitamin D deficiency, unspecified: Secondary | ICD-10-CM

## 2018-11-07 DIAGNOSIS — E669 Obesity, unspecified: Secondary | ICD-10-CM

## 2018-11-07 MED ORDER — METFORMIN HCL 500 MG PO TABS: 500.0000 mg | ORAL_TABLET | Freq: Every day | ORAL | 0 refills | Status: DC

## 2018-11-07 MED ORDER — VITAMIN D (ERGOCALCIFEROL) 1.25 MG (50000 UNIT) PO CAPS: 50000.0000 [IU] | ORAL_CAPSULE | ORAL | 0 refills | Status: DC

## 2018-11-08 LAB — LIPID PANEL WITH LDL/HDL RATIO
Cholesterol, Total: 238 mg/dL — ABNORMAL HIGH (ref 100–199)
HDL: 72 mg/dL (ref >39)
LDL Calculated: 140 mg/dL — ABNORMAL HIGH (ref 0–99)
LDl/HDL Ratio: 1.9 ratio (ref 0.0–3.2)
Triglycerides: 128 mg/dL (ref 0–149)
VLDL Cholesterol Cal: 26 mg/dL (ref 5–40)

## 2018-11-08 LAB — COMPREHENSIVE METABOLIC PANEL
ALT: 30 IU/L (ref 0–32)
AST: 24 IU/L (ref 0–40)
Albumin/Globulin Ratio: 1.8 (ref 1.2–2.2)
Albumin: 4.6 g/dL (ref 3.5–5.5)
Alkaline Phosphatase: 105 IU/L (ref 39–117)
BUN/Creatinine Ratio: 14 (ref 9–23)
BUN: 13 mg/dL (ref 6–24)
Bilirubin Total: 0.2 mg/dL (ref 0.0–1.2)
CALCIUM: 9.3 mg/dL (ref 8.7–10.2)
CO2: 22 mmol/L (ref 20–29)
CREATININE: 0.93 mg/dL (ref 0.57–1.00)
Chloride: 99 mmol/L (ref 96–106)
GFR calc Af Amer: 85 mL/min/{1.73_m2} (ref >59)
GFR, EST NON AFRICAN AMERICAN: 73 mL/min/{1.73_m2} (ref >59)
GLUCOSE: 97 mg/dL (ref 65–99)
Globulin, Total: 2.5 g/dL (ref 1.5–4.5)
Potassium: 4.7 mmol/L (ref 3.5–5.2)
Sodium: 140 mmol/L (ref 134–144)
Total Protein: 7.1 g/dL (ref 6.0–8.5)

## 2018-11-08 LAB — VITAMIN D 25 HYDROXY (VIT D DEFICIENCY, FRACTURES): VIT D 25 HYDROXY: 38.4 ng/mL (ref 30.0–100.0)

## 2018-11-08 LAB — INSULIN, RANDOM: INSULIN: 16.1 u[IU]/mL (ref 2.6–24.9)

## 2018-11-08 LAB — HEMOGLOBIN A1C
ESTIMATED AVERAGE GLUCOSE: 126 mg/dL
HEMOGLOBIN A1C: 6 % — AB (ref 4.8–5.6)

## 2018-11-08 NOTE — Progress Notes (Signed)
Office: [phone]  /  Fax: [phone]   HPI:   Chief Complaint: OBESITY Heather Moreno is here to discuss her progress with her obesity treatment plan. She is on the keep a food journal with 1500 calories and 90 grams of protein daily and is following her eating plan approximately 20 % of the time. She states she is exercising 0 minutes 0 times per week. Heather Moreno continues to do well with weight loss. She is mostly doing portion control and smarter control, and not journaling very often. She notes decreased exercise due to migraine this month.  Her weight is 152 lb (68.9 kg) today and has had a weight loss of 1 pounds over a period of 4 to 5 weeks since her last visit. She has lost 9 lbs since starting treatment with us.  Pre-Diabetes Heather Moreno has a diagnosis of pre-diabetes based on her elevated Hgb A1c and was informed this puts her at greater risk of developing diabetes. She is stable on metformin and denies nausea, vomiting, or hypoglycemia. She is doing well with diet and weight loss to decrease risk of diabetes.  At risk for diabetes Heather Moreno is at higher than average risk for developing diabetes due to her obesity and pre-diabetes. She currently denies polyuria or polydipsia.  Vitamin D Deficiency Heather Moreno has a diagnosis of vitamin D deficiency. She is stable on prescription Vit D, and she is due for labs. She denies nausea, vomiting or muscle weakness.  Hyperlipidemia Heather Moreno has hyperlipidemia and she is attempting to control her cholesterol levels with intensive lifestyle modification including a low saturated fat diet, exercise and weight loss. She is due for labs and denies any chest pain, claudication or myalgias.  ALLERGIES: Allergies  Allergen Reactions  . Aspirin Other (See Comments)    rhinitis  . Amoxicillin Rash    MEDICATIONS: No current outpatient medications on file prior to visit.   No current facility-administered medications on file prior to visit.     PAST MEDICAL  HISTORY: Past Medical History:  Diagnosis Date  . Allergic rhinitis   . Complication of anesthesia    difficulty with weakness after spinal  . Environmental allergies   . GERD (gastroesophageal reflux disease)   . Headache(784.0)   . HLD (hyperlipidemia)   . IBS (irritable bowel syndrome)   . Lactose intolerance   . Migraine headache with aura   . Vitamin D deficiency     PAST SURGICAL HISTORY: Past Surgical History:  Procedure Laterality Date  . cesarean sections  2009, 2003  . NASAL SINUS SURGERY Bilateral 1989    SOCIAL HISTORY: Social History   Tobacco Use  . Smoking status: Never Smoker  . Smokeless tobacco: Never Used  Substance Use Topics  . Alcohol use: Not on file  . Drug use: Not on file    FAMILY HISTORY: Family History  Problem Relation Age of Onset  . Diabetes Mother   . Hypertension Mother   . Hyperlipidemia Mother   . Thyroid disease Mother   . Diabetes Father   . Hypertension Father   . Hyperlipidemia Father   . Stroke Father   . Heart disease Father     ROS: Review of Systems  Constitutional: Positive for weight loss.  Cardiovascular: Negative for chest pain and claudication.  Gastrointestinal: Negative for nausea and vomiting.  Genitourinary: Negative for frequency.  Musculoskeletal: Negative for myalgias.       Negative muscle weakness  Endo/Heme/Allergies: Negative for polydipsia.       Negative hypoglycemia  PHYSICAL EXAM: Blood pressure 101/67, pulse 90, temperature 98.1 F (36.7 C), temperature source Oral, height 5\' 2"  (1.575 m), weight 152 lb (68.9 kg), SpO2 98 %. Body mass index is 27.8 kg/m. Physical Exam  Constitutional: She is oriented to person, place, and time. She appears well-developed and well-nourished.  Cardiovascular: Normal rate.  Pulmonary/Chest: Effort normal.  Musculoskeletal: Normal range of motion.  Neurological: She is oriented to person, place, and time.  Skin: Skin is warm and dry.  Psychiatric:  She has a normal mood and affect. Her behavior is normal.  Vitals reviewed.   RECENT LABS AND TESTS: BMET    Component Value Date/Time   NA 140 11/07/2018 0910   K 4.7 11/07/2018 0910   CL 99 11/07/2018 0910   CO2 22 11/07/2018 0910   GLUCOSE 97 11/07/2018 0910   BUN 13 11/07/2018 0910   CREATININE 0.93 11/07/2018 0910   CALCIUM 9.3 11/07/2018 0910   GFRNONAA 73 11/07/2018 0910   GFRAA 85 11/07/2018 0910   Lab Results  Component Value Date   HGBA1C 6.0 (H) 11/07/2018   HGBA1C 6.3 (H) 06/30/2018   Lab Results  Component Value Date   INSULIN 16.1 11/07/2018   INSULIN 25.3 (H) 06/30/2018   CBC    Component Value Date/Time   WBC 7.6 06/30/2018 0937   RBC 4.84 06/30/2018 0937   HGB 14.1 06/30/2018 0937   HCT 43.7 06/30/2018 0937   MCV 90 06/30/2018 0937   MCH 29.1 06/30/2018 0937   MCHC 32.3 06/30/2018 0937   RDW 13.9 06/30/2018 0937   LYMPHSABS 1.8 06/30/2018 0937   EOSABS 0.4 06/30/2018 0937   BASOSABS 0.1 06/30/2018 0937   Iron/TIBC/Ferritin/ %Sat No results found for: IRON, TIBC, FERRITIN, IRONPCTSAT Lipid Panel     Component Value Date/Time   CHOL 238 (H) 11/07/2018 0910   TRIG 128 11/07/2018 0910   HDL 72 11/07/2018 0910   LDLCALC 140 (H) 11/07/2018 0910   Hepatic Function Panel     Component Value Date/Time   PROT 7.1 11/07/2018 0910   ALBUMIN 4.6 11/07/2018 0910   AST 24 11/07/2018 0910   ALT 30 11/07/2018 0910   ALKPHOS 105 11/07/2018 0910   BILITOT 0.2 11/07/2018 0910      Component Value Date/Time   TSH 2.170 06/30/2018 0937  Results for Heather Moreno (MRN [id number]) as of 11/08/2018 10:45  Ref. Range 11/07/2018 09:10  Vitamin D, 25-Hydroxy Latest Ref Range: 30.0 - 100.0 ng/mL 38.4    ASSESSMENT AND PLAN: Prediabetes - Plan: Comprehensive metabolic panel, Hemoglobin A1c, Insulin, random, metFORMIN (GLUCOPHAGE) 500 MG tablet  Vitamin D deficiency - Plan: VITAMIN D 25 Hydroxy (Vit-D Deficiency, Fractures), Vitamin D, Ergocalciferol,  (DRISDOL) 1.25 MG (50000 UT) CAPS capsule  Other hyperlipidemia - Plan: Lipid Panel With LDL/HDL Ratio  At risk for diabetes mellitus  Class 1 obesity with serious comorbidity and body mass index (BMI) of 30.0 to 30.9 in adult, unspecified obesity type - Starting BMI greater then 30  PLAN:  Pre-Diabetes Heather Moreno will continue to work on weight loss, diet, exercise, and decreasing simple carbohydrates in her diet to help decrease the risk of diabetes. We dicussed metformin including benefits and risks. She was informed that eating too many simple carbohydrates or too many calories at one sitting increases the likelihood of GI side effects. Sophiana agrees to continue taking metformin 500 mg q AM #30 and we will refill for 1 month. Takela agrees to follow up with our clinic in 4 weeks as directed  to monitor her progress.  Diabetes risk counselling Heather Moreno was given extended (15 minutes) diabetes prevention counseling today. She is 46 y.o. female and has risk factors for diabetes including obesity and pre-diabetes. We discussed intensive lifestyle modifications today with an emphasis on weight loss as well as increasing exercise and decreasing simple carbohydrates in her diet.  Vitamin D Deficiency Heather Moreno was informed that low vitamin D levels contributes to fatigue and are associated with obesity, breast, and colon cancer. Heather Moreno agrees to continue taking prescription Vit D @50 ,000 IU every week #4 and we will refills for 1 month. She will follow up for routine testing of vitamin D, at least 2-3 times per year. She was informed of the risk of over-replacement of vitamin D and agrees to not increase her dose unless she discusses this with [place] first. Heather Moreno agrees to follow up with our clinic in 4 weeks.  Hyperlipidemia Heather Moreno was informed of the American Heart Association Guidelines emphasizing intensive lifestyle modifications as the first line treatment for hyperlipidemia. We discussed many lifestyle  modifications today in depth, and Heather Moreno will continue to work on decreasing saturated fats such as fatty red meat, butter and many fried foods. She will also increase vegetables and lean protein in her diet and continue to work on diet, exercise, and weight loss efforts. We will check labs and Heather Moreno agrees to follow up with our clinic in 4 weeks.  Obesity Heather Moreno is currently in the action stage of change. As such, her goal is to continue with weight loss efforts She has agreed to portion control better and make smarter food choices, such as increase vegetables and decrease simple carbohydrates  Heather Moreno has been instructed to work up to a goal of 150 minutes of combined cardio and strengthening exercise per week for weight loss and overall health benefits. We discussed the following Behavioral Modification Strategies today: increasing lean protein intake, work on meal planning and easy cooking plans and dealing with family or coworker sabotage   Heather Moreno has agreed to follow up with our clinic in 4 weeks. She was informed of the importance of frequent follow up visits to maximize her success with intensive lifestyle modifications for her multiple health conditions.   OBESITY BEHAVIORAL INTERVENTION VISIT  Today's visit was # 6   Starting weight: 161 lbs Starting date: 06/30/18 Today's weight : 152 lbs Today's date: 11/07/2018 Total lbs lost to date: 9    ASK: We discussed the diagnosis of obesity with Heather Moreno today and Heather Moreno agreed to give [place] permission to discuss obesity behavioral modification therapy today.  ASSESS: Heather Moreno has the diagnosis of obesity and her BMI today is 27.79 Heather Moreno is in the action stage of change   ADVISE: Heather Moreno was educated on the multiple health risks of obesity as well as the benefit of weight loss to improve her health. She was advised of the need for long term treatment and the importance of lifestyle modifications to improve her current health and to decrease  her risk of future health problems.  AGREE: Multiple dietary modification options and treatment options were discussed and  Heather Moreno agreed to follow the recommendations documented in the above note.  ARRANGE: Heather Moreno was educated on the importance of frequent visits to treat obesity as outlined per CMS and USPSTF guidelines and agreed to schedule her next follow up appointment today.  I, Burt Knack, am acting as transcriptionist for Quillian Quince, MD  I have reviewed the above documentation for accuracy and completeness, and I agree  with the above. -Dennard Nip, MD

## 2018-12-01 ENCOUNTER — Other Ambulatory Visit (INDEPENDENT_AMBULATORY_CARE_PROVIDER_SITE_OTHER): Payer: Self-pay | Admitting: Family Medicine

## 2018-12-01 DIAGNOSIS — E559 Vitamin D deficiency, unspecified: Secondary | ICD-10-CM

## 2018-12-07 ENCOUNTER — Ambulatory Visit (INDEPENDENT_AMBULATORY_CARE_PROVIDER_SITE_OTHER): Payer: BLUE CROSS/BLUE SHIELD | Admitting: Family Medicine

## 2018-12-08 ENCOUNTER — Other Ambulatory Visit (INDEPENDENT_AMBULATORY_CARE_PROVIDER_SITE_OTHER): Payer: Self-pay | Admitting: Family Medicine

## 2018-12-08 DIAGNOSIS — R7303 Prediabetes: Secondary | ICD-10-CM

## 2018-12-12 ENCOUNTER — Ambulatory Visit (INDEPENDENT_AMBULATORY_CARE_PROVIDER_SITE_OTHER): Payer: 59 | Admitting: Family Medicine

## 2018-12-12 ENCOUNTER — Encounter (INDEPENDENT_AMBULATORY_CARE_PROVIDER_SITE_OTHER): Payer: Self-pay | Admitting: Family Medicine

## 2018-12-12 VITALS — BP 129/73 | HR 76 | Temp 98.2°F | Ht 62.0 in | Wt 153.0 lb

## 2018-12-12 DIAGNOSIS — E559 Vitamin D deficiency, unspecified: Secondary | ICD-10-CM | POA: Diagnosis not present

## 2018-12-12 DIAGNOSIS — Z6830 Body mass index (BMI) 30.0-30.9, adult: Secondary | ICD-10-CM

## 2018-12-12 DIAGNOSIS — R7303 Prediabetes: Secondary | ICD-10-CM

## 2018-12-12 DIAGNOSIS — E669 Obesity, unspecified: Secondary | ICD-10-CM | POA: Diagnosis not present

## 2018-12-12 DIAGNOSIS — Z9189 Other specified personal risk factors, not elsewhere classified: Secondary | ICD-10-CM

## 2018-12-12 MED ORDER — VITAMIN D (ERGOCALCIFEROL) 1.25 MG (50000 UNIT) PO CAPS: 50000.0000 [IU] | ORAL_CAPSULE | ORAL | 1 refills | Status: DC

## 2018-12-12 MED ORDER — METFORMIN HCL 500 MG PO TABS: ORAL_TABLET | ORAL | 1 refills | Status: DC

## 2018-12-13 NOTE — Progress Notes (Signed)
Office: [phone]  /  Fax: [phone]   HPI:   Chief Complaint: OBESITY Heather Moreno is here to discuss her progress with her obesity treatment plan. She is keeping a food journal with 1200 calories and 75+ grams of protein and is following her eating plan approximately 50 to 60 % of the time. She states she is walking 30 minutes 3 times per week. Heather Moreno has done well minimizing weight gain over the holidays. She is not journaling as much as portion control. She would like to lose a little more weight this year.  Her weight is 153 lb (69.4 kg) today and has had a weight gain of 1 pound over a period of 5 weeks since her last visit. She has lost 8 lbs since starting treatment with [place].  Pre-Diabetes Heather Moreno has a diagnosis of pre-diabetes based on her elevated Hgb A1c and was informed this puts her at greater risk of developing diabetes. She is stable on metformin currently and she is doing well with her diet. Her A1c is improving and she continues to work on diet and exercise to decrease risk of diabetes. She denies nausea, vomiting, or hypoglycemia.  At risk for diabetes Heather Moreno is at higher than average risk for developing diabetes due to her pre-diabetes and obesity. She currently denies polyuria or polydipsia.  Vitamin D deficiency Heather Moreno has a diagnosis of vitamin D deficiency. She is currently taking vit D and her level is slowly improving, but is not yet at goal. She admits fatigue is improving and denies nausea, vomiting, or muscle weakness.  ASSESSMENT AND PLAN:  Prediabetes - Plan: metFORMIN (GLUCOPHAGE) 500 MG tablet  Vitamin D deficiency - Plan: Vitamin D, Ergocalciferol, (DRISDOL) 1.25 MG (50000 UT) CAPS capsule  At risk for diabetes mellitus  Class 1 obesity with serious comorbidity and body mass index (BMI) of 30.0 to 30.9 in adult, unspecified obesity type - Starting BMI greater then 30  PLAN:  Diabetes II Heather Moreno has been given extensive diabetes education by myself today  including ideal fasting and post-prandial blood glucose readings, individual ideal Hgb A1c goals, and hypoglycemia prevention. We discussed the importance of good blood sugar control to decrease the likelihood of diabetic complications such as nephropathy, neuropathy, limb loss, blindness, coronary artery disease, and death. We discussed the importance of intensive lifestyle modification including diet, exercise and weight loss as the first line treatment for diabetes. Heather Moreno agrees to continue her metformin 500mg  with breakfast #30 with 1 refill and will follow up at the agreed upon time in 8 weeks.  Diabetes risk counseling [name] was given extended (15 minutes) diabetes prevention counseling today. She is 48 y.o. female and has risk factors for diabetes including pre-diabetes and obesity. We discussed intensive lifestyle modifications today with an emphasis on weight loss as well as increasing exercise and decreasing simple carbohydrates in her diet.  Vitamin D Deficiency Heather Moreno was informed that low vitamin D levels contributes to fatigue and are associated with obesity, breast, and colon cancer. She agrees to continue to take prescription Vit D @50 ,000 IU every week #4 with 1 refill and will follow up for routine testing of vitamin D, at least 2-3 times per year. She was informed of the risk of over-replacement of vitamin D and agrees to not increase her dose unless she discusses this with [place] first. Heather Moreno agrees to follow up in 8 weeks.  Obesity Heather Moreno is currently in the action stage of change. As such, her goal is to continue with weight loss  efforts. She has agreed to keep a food journal with 1200 calories and 75+ grams of protein.  Asharia has been instructed to work up to a goal of 150 minutes of combined cardio and strengthening exercise per week for weight loss and overall health benefits. We discussed the following Behavioral Modification Strategies today: increasing lean protein intake,  decreasing simple carbohydrates, and work on meal planning and easy cooking plans.  Ladell HeadsSaima has agreed to follow up with our clinic in 8 weeks. She was informed of the importance of frequent follow up visits to maximize her success with intensive lifestyle modifications for her multiple health conditions.  ALLERGIES: Allergies  Allergen Reactions  . Aspirin Other (See Comments)    rhinitis  . Amoxicillin Rash    MEDICATIONS: No current outpatient medications on file prior to visit.   No current facility-administered medications on file prior to visit.     PAST MEDICAL HISTORY: Past Medical History:  Diagnosis Date  . Allergic rhinitis   . Complication of anesthesia    difficulty with weakness after spinal  . Environmental allergies   . GERD (gastroesophageal reflux disease)   . Headache(784.0)   . HLD (hyperlipidemia)   . IBS (irritable bowel syndrome)   . Lactose intolerance   . Migraine headache with aura   . Vitamin D deficiency     PAST SURGICAL HISTORY: Past Surgical History:  Procedure Laterality Date  . cesarean sections  2009, 2003  . NASAL SINUS SURGERY Bilateral 1989    SOCIAL HISTORY: Social History   Tobacco Use  . Smoking status: Never Smoker  . Smokeless tobacco: Never Used  Substance Use Topics  . Alcohol use: Not on file  . Drug use: Not on file    FAMILY HISTORY: Family History  Problem Relation Age of Onset  . Diabetes Mother   . Hypertension Mother   . Hyperlipidemia Mother   . Thyroid disease Mother   . Diabetes Father   . Hypertension Father   . Hyperlipidemia Father   . Stroke Father   . Heart disease Father     ROS: Review of Systems  Constitutional: Positive for malaise/fatigue. Negative for weight loss.  Gastrointestinal: Negative for nausea and vomiting.  Genitourinary:       Negative for polyuria.  Musculoskeletal:       Negative for muscle weakness.  Endo/Heme/Allergies: Negative for polydipsia.       Negative for  hypoglycemia.    PHYSICAL EXAM: Blood pressure 129/73, pulse 76, temperature 98.2 F (36.8 C), temperature source Oral, height 5\' 2"  (1.575 m), weight 153 lb (69.4 kg), SpO2 99 %. Body mass index is 27.98 kg/m. Physical Exam Vitals signs reviewed.  Constitutional:      Appearance: Normal appearance. She is obese.  Cardiovascular:     Rate and Rhythm: Normal rate.  Pulmonary:     Effort: Pulmonary effort is normal.  Musculoskeletal: Normal range of motion.  Skin:    General: Skin is warm and dry.  Neurological:     Mental Status: She is alert and oriented to person, place, and time.  Psychiatric:        Mood and Affect: Mood normal.        Behavior: Behavior normal.     RECENT LABS AND TESTS: BMET    Component Value Date/Time   NA 140 11/07/2018 0910   K 4.7 11/07/2018 0910   CL 99 11/07/2018 0910   CO2 22 11/07/2018 0910   GLUCOSE 97 11/07/2018 0910  BUN 13 11/07/2018 0910   CREATININE 0.93 11/07/2018 0910   CALCIUM 9.3 11/07/2018 0910   GFRNONAA 73 11/07/2018 0910   GFRAA 85 11/07/2018 0910   Lab Results  Component Value Date   HGBA1C 6.0 (H) 11/07/2018   HGBA1C 6.3 (H) 06/30/2018   Lab Results  Component Value Date   INSULIN 16.1 11/07/2018   INSULIN 25.3 (H) 06/30/2018   CBC    Component Value Date/Time   WBC 7.6 06/30/2018 0937   RBC 4.84 06/30/2018 0937   HGB 14.1 06/30/2018 0937   HCT 43.7 06/30/2018 0937   MCV 90 06/30/2018 0937   MCH 29.1 06/30/2018 0937   MCHC 32.3 06/30/2018 0937   RDW 13.9 06/30/2018 0937   LYMPHSABS 1.8 06/30/2018 0937   EOSABS 0.4 06/30/2018 0937   BASOSABS 0.1 06/30/2018 0937   Iron/TIBC/Ferritin/ %Sat No results found for: IRON, TIBC, FERRITIN, IRONPCTSAT Lipid Panel     Component Value Date/Time   CHOL 238 (H) 11/07/2018 0910   TRIG 128 11/07/2018 0910   HDL 72 11/07/2018 0910   LDLCALC 140 (H) 11/07/2018 0910   Hepatic Function Panel     Component Value Date/Time   PROT 7.1 11/07/2018 0910   ALBUMIN  4.6 11/07/2018 0910   AST 24 11/07/2018 0910   ALT 30 11/07/2018 0910   ALKPHOS 105 11/07/2018 0910   BILITOT 0.2 11/07/2018 0910      Component Value Date/Time   TSH 2.170 06/30/2018 0937   Results for JAZ, MARKWELL (MRN [id number]) as of 12/13/2018 12:47  Ref. Range 11/07/2018 09:10  Vitamin D, 25-Hydroxy Latest Ref Range: 30.0 - 100.0 ng/mL 38.4    OBESITY BEHAVIORAL INTERVENTION VISIT  Today's visit was # 7   Starting weight: 161 lbs Starting date: 06/30/18 Today's weight : Weight: 153 lb (69.4 kg)  Today's date: 12/12/2018 Total lbs lost to date: 8  ASK: We discussed the diagnosis of obesity with [name] today and Bettyjean agreed to give [place] permission to discuss obesity behavioral modification therapy today.  ASSESS: Arabelle has the diagnosis of obesity and her BMI today is 27.9. Ailyn is in the action stage of change.   ADVISE: Trenita was educated on the multiple health risks of obesity as well as the benefit of weight loss to improve her health. She was advised of the need for long term treatment and the importance of lifestyle modifications to improve her current health and to decrease her risk of future health problems.  AGREE: Multiple dietary modification options and treatment options were discussed and Melvin agreed to follow the recommendations documented in the above note.  ARRANGE: Adrieana was educated on the importance of frequent visits to treat obesity as outlined per CMS and USPSTF guidelines and agreed to schedule her next follow up appointment today.  I, Kirke Corin, am acting as transcriptionist for Wilder Glade, MD  I have reviewed the above documentation for accuracy and completeness, and I agree with the above. -Quillian Quince, MD

## 2019-01-08 ENCOUNTER — Other Ambulatory Visit (INDEPENDENT_AMBULATORY_CARE_PROVIDER_SITE_OTHER): Payer: Self-pay | Admitting: Family Medicine

## 2019-01-08 DIAGNOSIS — R7303 Prediabetes: Secondary | ICD-10-CM

## 2019-01-08 DIAGNOSIS — E559 Vitamin D deficiency, unspecified: Secondary | ICD-10-CM

## 2019-02-06 ENCOUNTER — Ambulatory Visit (INDEPENDENT_AMBULATORY_CARE_PROVIDER_SITE_OTHER): Payer: 59 | Admitting: Family Medicine

## 2019-02-06 ENCOUNTER — Encounter (INDEPENDENT_AMBULATORY_CARE_PROVIDER_SITE_OTHER): Payer: Self-pay | Admitting: Family Medicine

## 2019-02-06 VITALS — BP 122/70 | HR 80 | Ht 62.0 in | Wt 148.0 lb

## 2019-02-06 DIAGNOSIS — Z6830 Body mass index (BMI) 30.0-30.9, adult: Secondary | ICD-10-CM

## 2019-02-06 DIAGNOSIS — R7303 Prediabetes: Secondary | ICD-10-CM

## 2019-02-06 DIAGNOSIS — Z9189 Other specified personal risk factors, not elsewhere classified: Secondary | ICD-10-CM

## 2019-02-06 DIAGNOSIS — E669 Obesity, unspecified: Secondary | ICD-10-CM

## 2019-02-06 DIAGNOSIS — E559 Vitamin D deficiency, unspecified: Secondary | ICD-10-CM

## 2019-02-06 MED ORDER — VITAMIN D (ERGOCALCIFEROL) 1.25 MG (50000 UNIT) PO CAPS: 50000.0000 [IU] | ORAL_CAPSULE | ORAL | 1 refills | Status: DC

## 2019-02-06 MED ORDER — METFORMIN HCL 500 MG PO TABS: ORAL_TABLET | ORAL | 1 refills | Status: DC

## 2019-02-07 NOTE — Progress Notes (Signed)
Office: [phone]  /  Fax: [phone]   HPI:   Chief Complaint: OBESITY Heather Moreno is here to discuss her progress with her obesity treatment plan. She is keeping a food journal with 1200 calories and 75+ grams of protein and is following her eating plan approximately 70 % of Heather time. She states she is exercising 0 minutes 0 times per week. Armida continues to do well with weight loss. She is journaling on and off and being mindful overall, but she admits to not meeting her protein goal frequently.  Her weight is 148 lb (67.1 kg) today and has had a weight loss of 5 pounds over a period of 4 weeks since her last visit. She has lost 13 lbs since starting treatment with us.  Pre-Diabetes Heather Moreno has a diagnosis of pre-diabetes based on her elevated Hgb A1c and was informed this puts her at greater risk of developing diabetes. She is taking metformin currently and her last A1c was improved at 6.0 on 11/07/18. She is doing well on her diet prescription to decrease risk of diabetes. She denies nausea, vomiting, or hypoglycemia.   At risk for diabetes Heather Moreno is at higher than average risk for developing diabetes due to her pre-diabetes and obesity. She currently denies polyuria or polydipsia.  Vitamin D Deficiency Heather Moreno has a diagnosis of vitamin D deficiency. She is currently stable on vit D, but is not yet at goal. Heather Moreno denies nausea, vomiting, or muscle weakness.  ASSESSMENT AND PLAN:  Vitamin D deficiency - Plan: Vitamin D, Ergocalciferol, (DRISDOL) 1.25 MG (50000 UT) CAPS capsule  Prediabetes - Plan: metFORMIN (GLUCOPHAGE) 500 MG tablet  At risk for diabetes mellitus  Class 1 obesity with serious comorbidity and body mass index (BMI) of 30.0 to 30.9 in adult, unspecified obesity type - Starting BMI greater then 30  PLAN:  Insulin Resistance Heather Moreno will continue to work on weight loss, exercise, and decreasing simple carbohydrates in her diet to help decrease Heather risk of diabetes. She  was informed that eating too many simple carbohydrates or too many calories at one sitting increases Heather likelihood of GI side effects. Heather Moreno agreed to continue metformin 500 mg qAM #30 with 1 refill and prescription was written today. Heather Moreno agreed to follow up with us as directed to monitor her progress.   Diabetes risk counseling Heather Moreno was given extended (15 minutes) diabetes prevention counseling today. She is 48 y.o. female and has risk factors for diabetes including pre-diabetes and obesity. We discussed intensive lifestyle modifications today with an emphasis on weight loss as well as increasing exercise and decreasing simple carbohydrates in her diet.  Vitamin D Deficiency Heather Moreno was informed that low vitamin D levels contributes to fatigue and are associated with obesity, breast, and colon cancer. Heather Moreno agrees to continue to take prescription Vit D @50 ,000 IU every week #4 with 1 refill and will follow up for routine testing of vitamin D, at least 2-3 times per year. She was informed of Heather risk of over-replacement of vitamin D and agrees to not increase her dose unless she discusses this with us first. Heather Moreno agrees to follow up in 4 weeks as directed.  Obesity Heather Moreno is currently in Heather action stage of change. As such, her goal is to continue with weight loss efforts. She has agreed to keep a food journal with 1200 calories and 75+ grams of protein.  Heather Moreno has been instructed to work up to a goal of 150 minutes of combined cardio and strengthening exercise per  week for weight loss and overall health benefits. We discussed Heather following Behavioral Modification Strategies today: increasing lean protein intake, decreasing simple carbohydrates, and work on meal planning and easy cooking plans.  Heather Moreno has agreed to follow up with our clinic in 4 weeks for a fasting appointment. She was informed of Heather importance of frequent follow up visits to maximize her success with intensive lifestyle  modifications for her multiple health conditions.  ALLERGIES: Allergies  Allergen Reactions  . Aspirin Other (See Comments)    rhinitis  . Amoxicillin Rash    MEDICATIONS: No current outpatient medications on file prior to visit.   No current facility-administered medications on file prior to visit.     PAST MEDICAL HISTORY: Past Medical History:  Diagnosis Date  . Allergic rhinitis   . Complication of anesthesia    difficulty with weakness after spinal  . Environmental allergies   . GERD (gastroesophageal reflux disease)   . Headache(784.0)   . HLD (hyperlipidemia)   . IBS (irritable bowel syndrome)   . Lactose intolerance   . Migraine headache with aura   . Vitamin D deficiency     PAST SURGICAL HISTORY: Past Surgical History:  Procedure Laterality Date  . cesarean sections  2009, 2003  . NASAL SINUS SURGERY Bilateral 1989    SOCIAL HISTORY: Social History   Tobacco Use  . Smoking status: Never Smoker  . Smokeless tobacco: Never Used  Substance Use Topics  . Alcohol use: Not on file  . Drug use: Not on file    FAMILY HISTORY: Family History  Problem Relation Age of Onset  . Diabetes Mother   . Hypertension Mother   . Hyperlipidemia Mother   . Thyroid disease Mother   . Diabetes Father   . Hypertension Father   . Hyperlipidemia Father   . Stroke Father   . Heart disease Father     ROS: Review of Systems  Constitutional: Positive for weight loss.  Gastrointestinal: Negative for nausea and vomiting.  Genitourinary:       Negative for polyuria.  Musculoskeletal:       Negative for muscle weakness.  Endo/Heme/Allergies: Negative for polydipsia.       Negative for hypoglycemia.   PHYSICAL EXAM: Blood pressure 122/70, pulse 80, height 5\' 2"  (1.575 m), weight 148 lb (67.1 kg), SpO2 99 %. Body mass index is 27.07 kg/m. Physical Exam Vitals signs reviewed.  Constitutional:      Appearance: Normal appearance. She is obese.  Cardiovascular:      Rate and Rhythm: Normal rate.  Pulmonary:     Effort: Pulmonary effort is normal.  Musculoskeletal: Normal range of motion.  Skin:    General: Skin is warm and dry.  Neurological:     Mental Status: She is alert and oriented to person, place, and time.  Psychiatric:        Mood and Affect: Mood normal.        Behavior: Behavior normal.    RECENT LABS AND TESTS: BMET    Component Value Date/Time   NA 140 11/07/2018 0910   K 4.7 11/07/2018 0910   CL 99 11/07/2018 0910   CO2 22 11/07/2018 0910   GLUCOSE 97 11/07/2018 0910   BUN 13 11/07/2018 0910   CREATININE 0.93 11/07/2018 0910   CALCIUM 9.3 11/07/2018 0910   GFRNONAA 73 11/07/2018 0910   GFRAA 85 11/07/2018 0910   Lab Results  Component Value Date   HGBA1C 6.0 (H) 11/07/2018   HGBA1C 6.3 (  H) 06/30/2018   Lab Results  Component Value Date   INSULIN 16.1 11/07/2018   INSULIN 25.3 (H) 06/30/2018   CBC    Component Value Date/Time   WBC 7.6 06/30/2018 0937   RBC 4.84 06/30/2018 0937   HGB 14.1 06/30/2018 0937   HCT 43.7 06/30/2018 0937   MCV 90 06/30/2018 0937   MCH 29.1 06/30/2018 0937   MCHC 32.3 06/30/2018 0937   RDW 13.9 06/30/2018 0937   LYMPHSABS 1.8 06/30/2018 0937   EOSABS 0.4 06/30/2018 0937   BASOSABS 0.1 06/30/2018 0937   Iron/TIBC/Ferritin/ %Sat No results found for: IRON, TIBC, FERRITIN, IRONPCTSAT Lipid Panel     Component Value Date/Time   CHOL 238 (H) 11/07/2018 0910   TRIG 128 11/07/2018 0910   HDL 72 11/07/2018 0910   LDLCALC 140 (H) 11/07/2018 0910   Hepatic Function Panel     Component Value Date/Time   PROT 7.1 11/07/2018 0910   ALBUMIN 4.6 11/07/2018 0910   AST 24 11/07/2018 0910   ALT 30 11/07/2018 0910   ALKPHOS 105 11/07/2018 0910   BILITOT 0.2 11/07/2018 0910      Component Value Date/Time   TSH 2.170 06/30/2018 0937   Results for DULCINEA, AYAD (MRN [id number]) as of 02/07/2019 10:57  Ref. Range 11/07/2018 09:10  Vitamin D, 25-Hydroxy Latest Ref Range: 30.0 - 100.0  ng/mL 38.4   OBESITY BEHAVIORAL INTERVENTION VISIT  Today's visit was # 8   Starting weight: 161 lbs Starting date: 06/30/18 Today's weight : Weight: 148 lb (67.1 kg)  Today's date: 02/06/2019 Total lbs lost to date: 13    02/06/2019  Height 5\' 2"  (1.575 m)  Weight 148 lb (67.1 kg)  BMI (Calculated) 27.06  BLOOD PRESSURE - SYSTOLIC 122  BLOOD PRESSURE - DIASTOLIC 70   Body Fat % 30.3 %  Total Body Water (lbs) 72.6 lbs   ASK: We discussed Heather diagnosis of obesity with Heather Moreno today and Heather Moreno agreed to give [place] permission to discuss obesity behavioral modification therapy today.  ASSESS: Heather Moreno has Heather diagnosis of obesity and her BMI today is 27.06. Heather Moreno is in Heather action stage of change.   ADVISE: Heather Moreno was educated on Heather multiple health risks of obesity as well as Heather benefit of weight loss to improve her health. She was advised of Heather need for long term treatment and Heather importance of lifestyle modifications to improve her current health and to decrease her risk of future health problems.  AGREE: Multiple dietary modification options and treatment options were discussed and Heather Moreno agreed to follow Heather recommendations documented in Heather above note.  ARRANGE: Heather Moreno was educated on Heather importance of frequent visits to treat obesity as outlined per CMS and USPSTF guidelines and agreed to schedule her next follow up appointment today.  IKirke Corin, CMA, am acting as transcriptionist for Wilder Glade, MD  I have reviewed Heather above documentation for accuracy and completeness, and I agree with Heather above. -Quillian Quince, MD

## 2019-02-21 ENCOUNTER — Encounter (INDEPENDENT_AMBULATORY_CARE_PROVIDER_SITE_OTHER): Payer: Self-pay

## 2019-02-27 ENCOUNTER — Encounter (INDEPENDENT_AMBULATORY_CARE_PROVIDER_SITE_OTHER): Payer: Self-pay

## 2019-03-02 ENCOUNTER — Other Ambulatory Visit (INDEPENDENT_AMBULATORY_CARE_PROVIDER_SITE_OTHER): Payer: Self-pay | Admitting: Family Medicine

## 2019-03-02 DIAGNOSIS — R7303 Prediabetes: Secondary | ICD-10-CM

## 2019-03-06 ENCOUNTER — Ambulatory Visit (INDEPENDENT_AMBULATORY_CARE_PROVIDER_SITE_OTHER): Payer: 59 | Admitting: Family Medicine

## 2019-03-28 ENCOUNTER — Other Ambulatory Visit (INDEPENDENT_AMBULATORY_CARE_PROVIDER_SITE_OTHER): Payer: Self-pay | Admitting: Family Medicine

## 2019-03-28 ENCOUNTER — Encounter (INDEPENDENT_AMBULATORY_CARE_PROVIDER_SITE_OTHER): Payer: Self-pay

## 2019-03-28 DIAGNOSIS — E559 Vitamin D deficiency, unspecified: Secondary | ICD-10-CM

## 2019-04-01 ENCOUNTER — Other Ambulatory Visit (INDEPENDENT_AMBULATORY_CARE_PROVIDER_SITE_OTHER): Payer: Self-pay | Admitting: Family Medicine

## 2019-04-01 DIAGNOSIS — R7303 Prediabetes: Secondary | ICD-10-CM

## 2020-01-24 ENCOUNTER — Ambulatory Visit (INDEPENDENT_AMBULATORY_CARE_PROVIDER_SITE_OTHER): Payer: No Typology Code available for payment source | Admitting: Family Medicine

## 2020-01-24 ENCOUNTER — Encounter (INDEPENDENT_AMBULATORY_CARE_PROVIDER_SITE_OTHER): Payer: Self-pay | Admitting: Family Medicine

## 2020-01-24 ENCOUNTER — Other Ambulatory Visit: Payer: Self-pay

## 2020-01-24 VITALS — BP 120/77 | HR 83 | Temp 98.1°F | Ht 62.0 in | Wt 156.0 lb

## 2020-01-24 DIAGNOSIS — Z9189 Other specified personal risk factors, not elsewhere classified: Secondary | ICD-10-CM | POA: Diagnosis not present

## 2020-01-24 DIAGNOSIS — R7303 Prediabetes: Secondary | ICD-10-CM | POA: Diagnosis not present

## 2020-01-24 DIAGNOSIS — Z6830 Body mass index (BMI) 30.0-30.9, adult: Secondary | ICD-10-CM

## 2020-01-24 DIAGNOSIS — E559 Vitamin D deficiency, unspecified: Secondary | ICD-10-CM

## 2020-01-24 DIAGNOSIS — E669 Obesity, unspecified: Secondary | ICD-10-CM

## 2020-01-24 DIAGNOSIS — F3289 Other specified depressive episodes: Secondary | ICD-10-CM

## 2020-01-24 MED ORDER — VITAMIN D (ERGOCALCIFEROL) 1.25 MG (50000 UNIT) PO CAPS: 50000.0000 [IU] | ORAL_CAPSULE | ORAL | 0 refills | Status: DC

## 2020-01-24 MED ORDER — METFORMIN HCL 500 MG PO TABS: ORAL_TABLET | ORAL | 0 refills | Status: DC

## 2020-01-24 MED ORDER — ESCITALOPRAM OXALATE 10 MG PO TABS: 10.0000 mg | ORAL_TABLET | Freq: Every day | ORAL | 0 refills | Status: DC

## 2020-01-24 NOTE — Progress Notes (Signed)
Chief Complaint:   OBESITY Heather Moreno is here to discuss her progress with her obesity treatment plan along with follow-up of her obesity related diagnoses. Heather Moreno is on keeping a food journal and adhering to recommended goals of 1200 calories and 75+ grams of protein daily and states she is following her eating plan approximately 50% of the time. Heather Moreno states she is doing 0 minutes 0 times per week.  Today's visit was #: 9 Starting weight: 161 lbs Starting date: 06/30/18 Today's weight: 156 lbs Today's date: 01/24/2020 Total lbs lost to date: 5 Total lbs lost since last in-office visit: 0  Interim History: Heather Moreno's last in office visit was almost 1 year ago, right before the pandemic. She notes increased emotional eating and decreased motivation to make changes. She states she is now ready to get back on track.  Subjective:   1. Pre-diabetes Heather Moreno had been off metformin for a few months, and she restarted metformin but ER version with her primary care physician. We discussed decreased weight loss improvement on her ER version.  2. Vitamin D deficiency Heather Moreno is due to have labs done. She notes fatigue.  3. Other depression, emotional eating Heather Moreno notes increased stress at home and with the pandemic, and has increased emotional eating. She is not sure if she wants to start medications.  4. At risk for diabetes mellitus Heather Moreno is at higher than average risk for developing diabetes due to her obesity.   Assessment/Plan:   1. Pre-diabetes Heather Moreno will continue to work on weight loss, diet, exercise, and decreasing simple carbohydrates to help decrease the risk of diabetes. Heather Moreno agreed to change metformin to 500 mg q AM #90 day supply with no refill (discontinue ER dose). We will continue to monitor.   - metFORMIN (GLUCOPHAGE) 500 MG tablet; TAKE 1 TABLET BY MOUTH EVERY DAY WITH BREAKFAST  Dispense: 90 tablet; Refill: 0  2. Vitamin D deficiency Low Vitamin D level contributes to fatigue  and are associated with obesity, breast, and colon cancer. We will refill prescription Vitamin D 50,000 IU every week for 90 days with no refill. Heather Moreno will follow-up for routine testing of Vitamin D, at least 2-3 times per year to avoid over-replacement.  - Vitamin D, Ergocalciferol, (DRISDOL) 1.25 MG (50000 UNIT) CAPS capsule; Take 1 capsule (50,000 Units total) by mouth every 7 (seven) days.  Dispense: 12 capsule; Refill: 0  3. Other depression, emotional eating Behavior modification techniques were discussed today to help Heather Moreno deal with her emotional/non-hunger eating behaviors. Heather Moreno agreed to start Lexapro 10 mg q AM #90 day supply with no refill. Orders and follow up as documented in patient record.   - escitalopram (LEXAPRO) 10 MG tablet; Take 1 tablet (10 mg total) by mouth daily.  Dispense: 90 tablet; Refill: 0  4. At risk for diabetes mellitus Heather Moreno was given approximately 30 minutes of diabetes education and counseling today. We discussed intensive lifestyle modifications today with an emphasis on weight loss as well as increasing exercise and decreasing simple carbohydrates in her diet. We also reviewed medication options with an emphasis on risk versus benefit of those discussed.   Repetitive spaced learning was employed today to elicit superior memory formation and behavioral change.  5. Class 1 obesity with serious comorbidity and body mass index (BMI) of 30.0 to 30.9 in adult, unspecified obesity type Heather Moreno is currently in the action stage of change. As such, her goal is to continue with weight loss efforts. She has agreed to keeping  a food journal and adhering to recommended goals of 1200 calories and 75+ grams of protein daily.   Behavioral modification strategies: increasing lean protein intake and meal planning and cooking strategies.  Heather Moreno has agreed to follow-up with our clinic in 3 months. She was informed of the importance of frequent follow-up visits to maximize her  success with intensive lifestyle modifications for her multiple health conditions.   Heather Moreno was informed we would discuss her lab results at her next visit unless there is a critical issue that needs to be addressed sooner. Heather Moreno agreed to keep her next visit at the agreed upon time to discuss these results.  Objective:   Blood pressure 120/77, pulse 83, temperature 98.1 F (36.7 C), temperature source Oral, height 5\' 2"  (1.575 m), weight 156 lb (70.8 kg), SpO2 98 %. Body mass index is 28.53 kg/m.  General: Cooperative, alert, well developed, in no acute distress. HEENT: Conjunctivae and lids unremarkable. Cardiovascular: Regular rhythm.  Lungs: Normal work of breathing. Neurologic: No focal deficits.   Lab Results  Component Value Date   CREATININE 0.93 11/07/2018   BUN 13 11/07/2018   NA 140 11/07/2018   K 4.7 11/07/2018   CL 99 11/07/2018   CO2 22 11/07/2018   Lab Results  Component Value Date   ALT 30 11/07/2018   AST 24 11/07/2018   ALKPHOS 105 11/07/2018   BILITOT 0.2 11/07/2018   Lab Results  Component Value Date   HGBA1C 6.0 (H) 11/07/2018   HGBA1C 6.3 (H) 06/30/2018   Lab Results  Component Value Date   INSULIN 16.1 11/07/2018   INSULIN 25.3 (H) 06/30/2018   Lab Results  Component Value Date   TSH 2.170 06/30/2018   Lab Results  Component Value Date   CHOL 238 (H) 11/07/2018   HDL 72 11/07/2018   LDLCALC 140 (H) 11/07/2018   TRIG 128 11/07/2018   Lab Results  Component Value Date   WBC 7.6 06/30/2018   HGB 14.1 06/30/2018   HCT 43.7 06/30/2018   MCV 90 06/30/2018   No results found for: IRON, TIBC, FERRITIN  Attestation Statements:   Reviewed by clinician on day of visit: allergies, medications, problem list, medical history, surgical history, family history, social history, and previous encounter notes.   I, Trixie Dredge, am acting as transcriptionist for Dennard Nip, MD.  I have reviewed the above documentation for accuracy and  completeness, and I agree with the above. -  Dennard Nip, MD

## 2020-01-25 LAB — CBC WITH DIFFERENTIAL/PLATELET
Basophils Absolute: 0.1 10*3/uL (ref 0.0–0.2)
Basos: 2 %
EOS (ABSOLUTE): 0.3 10*3/uL (ref 0.0–0.4)
Eos: 5 %
Hematocrit: 43.9 % (ref 34.0–46.6)
Hemoglobin: 14.5 g/dL (ref 11.1–15.9)
Immature Grans (Abs): 0 10*3/uL (ref 0.0–0.1)
Immature Granulocytes: 0 %
Lymphocytes Absolute: 1.8 10*3/uL (ref 0.7–3.1)
Lymphs: 29 %
MCH: 29.1 pg (ref 26.6–33.0)
MCHC: 33 g/dL (ref 31.5–35.7)
MCV: 88 fL (ref 79–97)
Monocytes Absolute: 0.5 10*3/uL (ref 0.1–0.9)
Monocytes: 8 %
Neutrophils Absolute: 3.4 10*3/uL (ref 1.4–7.0)
Neutrophils: 56 %
Platelets: 307 10*3/uL (ref 150–450)
RBC: 4.98 x10E6/uL (ref 3.77–5.28)
RDW: 13.2 % (ref 11.7–15.4)
WBC: 6.1 10*3/uL (ref 3.4–10.8)

## 2020-01-25 LAB — COMPREHENSIVE METABOLIC PANEL
ALT: 32 IU/L (ref 0–32)
AST: 18 IU/L (ref 0–40)
Albumin/Globulin Ratio: 1.7 (ref 1.2–2.2)
Albumin: 4.7 g/dL (ref 3.8–4.8)
Alkaline Phosphatase: 112 IU/L (ref 39–117)
BUN/Creatinine Ratio: 20 (ref 9–23)
BUN: 17 mg/dL (ref 6–24)
Bilirubin Total: 0.3 mg/dL (ref 0.0–1.2)
CO2: 24 mmol/L (ref 20–29)
Calcium: 9.7 mg/dL (ref 8.7–10.2)
Chloride: 101 mmol/L (ref 96–106)
Creatinine, Ser: 0.84 mg/dL (ref 0.57–1.00)
GFR calc Af Amer: 95 mL/min/{1.73_m2} (ref >59)
GFR calc non Af Amer: 82 mL/min/{1.73_m2} (ref >59)
Globulin, Total: 2.8 g/dL (ref 1.5–4.5)
Glucose: 103 mg/dL — ABNORMAL HIGH (ref 65–99)
Potassium: 4.9 mmol/L (ref 3.5–5.2)
Sodium: 138 mmol/L (ref 134–144)
Total Protein: 7.5 g/dL (ref 6.0–8.5)

## 2020-01-25 LAB — VITAMIN B12: Vitamin B-12: 989 pg/mL (ref 232–1245)

## 2020-01-25 LAB — LIPID PANEL WITH LDL/HDL RATIO
Cholesterol, Total: 253 mg/dL — ABNORMAL HIGH (ref 100–199)
HDL: 77 mg/dL (ref >39)
LDL Chol Calc (NIH): 137 mg/dL — ABNORMAL HIGH (ref 0–99)
LDL/HDL Ratio: 1.8 ratio (ref 0.0–3.2)
Triglycerides: 220 mg/dL — ABNORMAL HIGH (ref 0–149)
VLDL Cholesterol Cal: 39 mg/dL (ref 5–40)

## 2020-01-25 LAB — FOLATE: Folate: 14.1 ng/mL (ref >3.0)

## 2020-01-25 LAB — TSH: TSH: 2.46 u[IU]/mL (ref 0.450–4.500)

## 2020-01-25 LAB — T3: T3, Total: 122 ng/dL (ref 71–180)

## 2020-01-25 LAB — HEMOGLOBIN A1C
Est. average glucose Bld gHb Est-mCnc: 131 mg/dL
Hgb A1c MFr Bld: 6.2 % — ABNORMAL HIGH (ref 4.8–5.6)

## 2020-01-25 LAB — VITAMIN D 25 HYDROXY (VIT D DEFICIENCY, FRACTURES): Vit D, 25-Hydroxy: 27.1 ng/mL — ABNORMAL LOW (ref 30.0–100.0)

## 2020-01-25 LAB — INSULIN, RANDOM: INSULIN: 28.5 u[IU]/mL — ABNORMAL HIGH (ref 2.6–24.9)

## 2020-01-25 LAB — T4, FREE: Free T4: 1.23 ng/dL (ref 0.82–1.77)

## 2020-04-12 ENCOUNTER — Other Ambulatory Visit (INDEPENDENT_AMBULATORY_CARE_PROVIDER_SITE_OTHER): Payer: Self-pay | Admitting: Family Medicine

## 2020-04-12 DIAGNOSIS — E559 Vitamin D deficiency, unspecified: Secondary | ICD-10-CM

## 2020-04-18 ENCOUNTER — Other Ambulatory Visit (INDEPENDENT_AMBULATORY_CARE_PROVIDER_SITE_OTHER): Payer: Self-pay | Admitting: Family Medicine

## 2020-04-18 DIAGNOSIS — F3289 Other specified depressive episodes: Secondary | ICD-10-CM

## 2020-04-20 ENCOUNTER — Other Ambulatory Visit (INDEPENDENT_AMBULATORY_CARE_PROVIDER_SITE_OTHER): Payer: Self-pay | Admitting: Family Medicine

## 2020-04-20 DIAGNOSIS — R7303 Prediabetes: Secondary | ICD-10-CM

## 2020-04-24 ENCOUNTER — Ambulatory Visit (INDEPENDENT_AMBULATORY_CARE_PROVIDER_SITE_OTHER): Payer: Managed Care, Other (non HMO) | Admitting: Family Medicine

## 2020-04-24 ENCOUNTER — Other Ambulatory Visit: Payer: Self-pay

## 2020-04-24 ENCOUNTER — Encounter (INDEPENDENT_AMBULATORY_CARE_PROVIDER_SITE_OTHER): Payer: Self-pay | Admitting: Family Medicine

## 2020-04-24 VITALS — BP 110/64 | HR 81 | Temp 98.0°F | Ht 62.0 in | Wt 163.0 lb

## 2020-04-24 DIAGNOSIS — Z9189 Other specified personal risk factors, not elsewhere classified: Secondary | ICD-10-CM | POA: Diagnosis not present

## 2020-04-24 DIAGNOSIS — E669 Obesity, unspecified: Secondary | ICD-10-CM

## 2020-04-24 DIAGNOSIS — Z6830 Body mass index (BMI) 30.0-30.9, adult: Secondary | ICD-10-CM

## 2020-04-24 DIAGNOSIS — R7303 Prediabetes: Secondary | ICD-10-CM

## 2020-04-24 DIAGNOSIS — E559 Vitamin D deficiency, unspecified: Secondary | ICD-10-CM | POA: Diagnosis not present

## 2020-04-24 MED ORDER — METFORMIN HCL 500 MG PO TABS: ORAL_TABLET | ORAL | 0 refills | Status: DC

## 2020-04-24 MED ORDER — VITAMIN D (ERGOCALCIFEROL) 1.25 MG (50000 UNIT) PO CAPS: 50000.0000 [IU] | ORAL_CAPSULE | ORAL | 0 refills | Status: DC

## 2020-04-24 NOTE — Progress Notes (Signed)
Chief Complaint:   OBESITY Heather Moreno is here to discuss her progress with her obesity treatment plan along with follow-up of her obesity related diagnoses. Heather Moreno is on keeping a food journal and adhering to recommended goals of 1200 calories and 75+ grams of protein daily and states she is following her eating plan approximately 50% of the time. Heather Moreno states she is walking for 30-40 minutes 2-3 times per week.  Today's visit was #: 10 Starting weight: 161 lbs Starting date: 06/30/2018 Today's weight: 163 lbs Today's date: 04/24/2020 Total lbs lost to date: 0 Total lbs lost since last in-office visit: 0  Interim History: Heather Moreno notes her weight has been creeping up in the last 3 months. She has had decreased activity in the pandemic and may be starting pre-menopause or developing polycystic ovarian syndrome (or both). She is ready to start getting back on track.  Subjective:   1. Pre-diabetes Heather Moreno notes some polyphagia and potentially is developing polycystic ovarian syndrome. Her weight is creeping up as well.  2. Vitamin D deficiency Heather Moreno is stable on Vit D, and she denies nausea, vomiting, or muscle weakness. She is due for labs soon.  3. At risk for hyperglycemia Heather Moreno is at increased risk for hyperglycemia due to pre-diabetes.  Assessment/Plan:   1. Pre-diabetes Heather Moreno will get back to diet, exercise, weight loss, and decreasing simple carbohydrates to help decrease the risk of diabetes. Heather Moreno agreed to increase metformin to 500 mg BID with a 90 days supply with no refills. We will recheck labs in 1 month.  - metFORMIN (GLUCOPHAGE) 500 MG tablet; Take one tablet by mouth twice a day  Dispense: 180 tablet; Refill: 0  2. Vitamin D deficiency Low Vitamin D level contributes to fatigue and are associated with obesity, breast, and colon cancer. We will refill prescription Vitamin D for 90 days with no refills. Heather Moreno will follow-up for routine testing of Vitamin D, at least 2-3 times  per year to avoid over-replacement. We will recheck labs in 1 month.  - Vitamin D, Ergocalciferol, (DRISDOL) 1.25 MG (50000 UNIT) CAPS capsule; Take 1 capsule (50,000 Units total) by mouth every 7 (seven) days.  Dispense: 12 capsule; Refill: 0  3. At risk for hyperglycemia Heather Moreno was given approximately 15 minutes of counseling today regarding prevention of hyperglycemia. She was advised of hyperglycemia causes and the fact hyperglycemia is often asymptomatic. Heather Moreno was instructed to avoid skipping meals, eat regular protein rich meals and schedule low calorie but protein rich snacks as needed.   Repetitive spaced learning was employed today to elicit superior memory formation and behavioral change  4. Class 1 obesity with serious comorbidity and body mass index (BMI) of 30.0 to 30.9 in adult, unspecified obesity type Heather Moreno is currently in the action stage of change. As such, her goal is to get back to weightloss efforts . She has agreed to keeping a food journal and adhering to recommended goals of 1200 calories and 75+ grams of protein daily.   Exercise goals: As is.  Behavioral modification strategies: increasing lean protein intake and decreasing simple carbohydrates.  Heather Moreno has agreed to follow-up with our clinic in 4 weeks. She was informed of the importance of frequent follow-up visits to maximize her success with intensive lifestyle modifications for her multiple health conditions.   Objective:   Blood pressure 110/64, pulse 81, temperature 98 F (36.7 C), temperature source Oral, height 5\' 2"  (1.575 m), weight 163 lb (73.9 kg), SpO2 97 %. Body mass index  is 29.81 kg/m.  General: Cooperative, alert, well developed, in no acute distress. HEENT: Conjunctivae and lids unremarkable. Cardiovascular: Regular rhythm.  Lungs: Normal work of breathing. Neurologic: No focal deficits.   Lab Results  Component Value Date   CREATININE 0.84 01/24/2020   BUN 17 01/24/2020   NA 138  01/24/2020   K 4.9 01/24/2020   CL 101 01/24/2020   CO2 24 01/24/2020   Lab Results  Component Value Date   ALT 32 01/24/2020   AST 18 01/24/2020   ALKPHOS 112 01/24/2020   BILITOT 0.3 01/24/2020   Lab Results  Component Value Date   HGBA1C 6.2 (H) 01/24/2020   HGBA1C 6.0 (H) 11/07/2018   HGBA1C 6.3 (H) 06/30/2018   Lab Results  Component Value Date   INSULIN 28.5 (H) 01/24/2020   INSULIN 16.1 11/07/2018   INSULIN 25.3 (H) 06/30/2018   Lab Results  Component Value Date   TSH 2.460 01/24/2020   Lab Results  Component Value Date   CHOL 253 (H) 01/24/2020   HDL 77 01/24/2020   LDLCALC 137 (H) 01/24/2020   TRIG 220 (H) 01/24/2020   Lab Results  Component Value Date   WBC 6.1 01/24/2020   HGB 14.5 01/24/2020   HCT 43.9 01/24/2020   MCV 88 01/24/2020   PLT 307 01/24/2020   No results found for: IRON, TIBC, FERRITIN  Attestation Statements:   Reviewed by clinician on day of visit: allergies, medications, problem list, medical history, surgical history, family history, social history, and previous encounter notes.   I, Trixie Dredge, am acting as transcriptionist for Dennard Nip, MD.  I have reviewed the above documentation for accuracy and completeness, and I agree with the above. -  Dennard Nip, MD

## 2020-05-22 ENCOUNTER — Other Ambulatory Visit (INDEPENDENT_AMBULATORY_CARE_PROVIDER_SITE_OTHER): Payer: Self-pay | Admitting: Family Medicine

## 2020-05-22 ENCOUNTER — Ambulatory Visit (INDEPENDENT_AMBULATORY_CARE_PROVIDER_SITE_OTHER): Payer: 59 | Admitting: Family Medicine

## 2020-05-22 ENCOUNTER — Encounter (INDEPENDENT_AMBULATORY_CARE_PROVIDER_SITE_OTHER): Payer: Self-pay | Admitting: Family Medicine

## 2020-05-22 ENCOUNTER — Other Ambulatory Visit: Payer: Self-pay

## 2020-05-22 VITALS — BP 114/71 | HR 72 | Temp 98.1°F | Ht 62.0 in | Wt 162.0 lb

## 2020-05-22 DIAGNOSIS — E559 Vitamin D deficiency, unspecified: Secondary | ICD-10-CM | POA: Diagnosis not present

## 2020-05-22 DIAGNOSIS — E7849 Other hyperlipidemia: Secondary | ICD-10-CM

## 2020-05-22 DIAGNOSIS — R7303 Prediabetes: Secondary | ICD-10-CM

## 2020-05-22 DIAGNOSIS — E669 Obesity, unspecified: Secondary | ICD-10-CM

## 2020-05-22 DIAGNOSIS — Z9189 Other specified personal risk factors, not elsewhere classified: Secondary | ICD-10-CM

## 2020-05-22 DIAGNOSIS — Z6830 Body mass index (BMI) 30.0-30.9, adult: Secondary | ICD-10-CM

## 2020-05-22 MED ORDER — METFORMIN HCL 500 MG PO TABS: ORAL_TABLET | ORAL | 0 refills | Status: DC

## 2020-05-22 MED ORDER — VITAMIN D (ERGOCALCIFEROL) 1.25 MG (50000 UNIT) PO CAPS: 50000.0000 [IU] | ORAL_CAPSULE | ORAL | 0 refills | Status: DC

## 2020-05-23 LAB — COMPREHENSIVE METABOLIC PANEL WITH GFR
ALT: 41 IU/L — ABNORMAL HIGH (ref 0–32)
AST: 27 IU/L (ref 0–40)
Albumin/Globulin Ratio: 1.6 (ref 1.2–2.2)
Albumin: 4.7 g/dL (ref 3.8–4.8)
Alkaline Phosphatase: 106 IU/L (ref 48–121)
BUN/Creatinine Ratio: 22 (ref 9–23)
BUN: 21 mg/dL (ref 6–24)
Bilirubin Total: <0.2 mg/dL (ref 0.0–1.2)
CO2: 24 mmol/L (ref 20–29)
Calcium: 9.9 mg/dL (ref 8.7–10.2)
Chloride: 100 mmol/L (ref 96–106)
Creatinine, Ser: 0.94 mg/dL (ref 0.57–1.00)
GFR calc Af Amer: 83 mL/min/1.73
GFR calc non Af Amer: 72 mL/min/1.73
Globulin, Total: 2.9 g/dL (ref 1.5–4.5)
Glucose: 109 mg/dL — ABNORMAL HIGH (ref 65–99)
Potassium: 4.8 mmol/L (ref 3.5–5.2)
Sodium: 137 mmol/L (ref 134–144)
Total Protein: 7.6 g/dL (ref 6.0–8.5)

## 2020-05-23 LAB — VITAMIN D 25 HYDROXY (VIT D DEFICIENCY, FRACTURES): Vit D, 25-Hydroxy: 29.8 ng/mL — ABNORMAL LOW (ref 30.0–100.0)

## 2020-05-23 LAB — LIPID PANEL WITH LDL/HDL RATIO
Cholesterol, Total: 261 mg/dL — ABNORMAL HIGH (ref 100–199)
HDL: 82 mg/dL
LDL Chol Calc (NIH): 157 mg/dL — ABNORMAL HIGH (ref 0–99)
LDL/HDL Ratio: 1.9 ratio (ref 0.0–3.2)
Triglycerides: 126 mg/dL (ref 0–149)
VLDL Cholesterol Cal: 22 mg/dL (ref 5–40)

## 2020-05-23 LAB — INSULIN, RANDOM: INSULIN: 27 u[IU]/mL — ABNORMAL HIGH (ref 2.6–24.9)

## 2020-05-23 LAB — HEMOGLOBIN A1C
Est. average glucose Bld gHb Est-mCnc: 131 mg/dL
Hgb A1c MFr Bld: 6.2 % — ABNORMAL HIGH (ref 4.8–5.6)

## 2020-05-23 NOTE — Progress Notes (Signed)
Chief Complaint:   OBESITY Heather Moreno is here to discuss her progress with her obesity treatment plan along with follow-up of her obesity related diagnoses. Heather Moreno is on keeping a food journal and adhering to recommended goals of 1200 calories and 75+ grams of protein daily and states she is following her eating plan approximately 60% of the time. Heather Moreno states she is walking for 40 minutes 3 times per week.  Today's visit was #: 11 Starting weight: 161 lbs Starting date: 06/30/2018 Today's weight: 162 lbs Today's date: 05/22/2020 Total lbs lost to date: 0 Total lbs lost since last in-office visit: 1  Interim History: Heather Moreno has done better with journaling and she has increased walking even on vacation for 2 weeks. She is mindful of her food choices but she sometimes indulges but not enough to gain weight.  Subjective:   1. Pre-diabetes Heather Moreno had a pharmacy error and then went on vacation, so she has just now started her second dose of metformin. She struggles to remember the lunch time dose.  2. Vitamin D deficiency Heather Moreno's last Vit D level was not yet at goal. She denies nausea or vomiting.  3. Other hyperlipidemia Heather Moreno is working on diet and exercise, and she is due to have labs checked.  4. At risk for diabetes mellitus Heather Moreno is at higher than average risk for developing diabetes due to her obesity.   Assessment/Plan:   1. Pre-diabetes Senaya will continue to work on weight loss, exercise, and decreasing simple carbohydrates to help decrease the risk of diabetes. We will check labs today, and we will refill metformin for 1 month. Heather Moreno will wotk on taking her second dose.  - Hemoglobin A1c - Insulin, random  - metFORMIN (GLUCOPHAGE) 500 MG tablet; Take one tablet by mouth twice a day  Dispense: 60 tablet; Refill: 0  2. Vitamin D deficiency Low Vitamin D level contributes to fatigue and are associated with obesity, breast, and colon cancer. We will check labs today, and we  will refill prescription Vitamin D for 1 month. She will follow-up for routine testing of Vitamin D, at least 2-3 times per year to avoid over-replacement.  - VITAMIN D 25 Hydroxy (Vit-D Deficiency, Fractures)  - Vitamin D, Ergocalciferol, (DRISDOL) 1.25 MG (50000 UNIT) CAPS capsule; Take 1 capsule (50,000 Units total) by mouth every 7 (seven) days.  Dispense: 4 capsule; Refill: 0  3. Other hyperlipidemia Cardiovascular risk and specific lipid/LDL goals reviewed. We discussed several lifestyle modifications today and Heather Moreno will continue to work on diet, exercise and weight loss efforts. We will check labs today. Orders and follow up as documented in patient record.   Counseling Intensive lifestyle modifications are the first line treatment for this issue. . Dietary changes: Increase soluble fiber. Decrease simple carbohydrates. . Exercise changes: Moderate to vigorous-intensity aerobic activity 150 minutes per week if tolerated. . Lipid-lowering medications: see documented in medical record.  - Comprehensive metabolic panel - Lipid Panel With LDL/HDL Ratio  4. At risk for diabetes mellitus Heather Moreno was given approximately 15 minutes of diabetes education and counseling today. We discussed intensive lifestyle modifications today with an emphasis on weight loss as well as increasing exercise and decreasing simple carbohydrates in her diet. We also reviewed medication options with an emphasis on risk versus benefit of those discussed.   Repetitive spaced learning was employed today to elicit superior memory formation and behavioral change.  5. Class 1 obesity with serious comorbidity and body mass index (BMI) of 30.0 to  30.9 in adult, unspecified obesity type Heather Moreno is currently in the action stage of change. As such, her goal is to continue with weight loss efforts. She has agreed to keeping a food journal and adhering to recommended goals of 1400 calories and 90 grams of protein daily.    Exercise goals: As is.  Behavioral modification strategies: meal planning and cooking strategies.  Heather Moreno has agreed to follow-up with our clinic in 4 weeks. She was informed of the importance of frequent follow-up visits to maximize her success with intensive lifestyle modifications for her multiple health conditions.   Heather Moreno was informed we would discuss her lab results at her next visit unless there is a critical issue that needs to be addressed sooner. Heather Moreno agreed to keep her next visit at the agreed upon time to discuss these results.  Objective:   Blood pressure 114/71, pulse 72, temperature 98.1 F (36.7 C), temperature source Oral, height 5\' 2"  (1.575 m), weight 162 lb (73.5 kg), SpO2 97 %. Body mass index is 29.63 kg/m.  General: Cooperative, alert, well developed, in no acute distress. HEENT: Conjunctivae and lids unremarkable. Cardiovascular: Regular rhythm.  Lungs: Normal work of breathing. Neurologic: No focal deficits.   Lab Results  Component Value Date   CREATININE 0.94 05/22/2020   BUN 21 05/22/2020   NA 137 05/22/2020   K 4.8 05/22/2020   CL 100 05/22/2020   CO2 24 05/22/2020   Lab Results  Component Value Date   ALT 41 (H) 05/22/2020   AST 27 05/22/2020   ALKPHOS 106 05/22/2020   BILITOT <0.2 05/22/2020   Lab Results  Component Value Date   HGBA1C 6.2 (H) 05/22/2020   HGBA1C 6.2 (H) 01/24/2020   HGBA1C 6.0 (H) 11/07/2018   HGBA1C 6.3 (H) 06/30/2018   Lab Results  Component Value Date   INSULIN 27.0 (H) 05/22/2020   INSULIN 28.5 (H) 01/24/2020   INSULIN 16.1 11/07/2018   INSULIN 25.3 (H) 06/30/2018   Lab Results  Component Value Date   TSH 2.460 01/24/2020   Lab Results  Component Value Date   CHOL 261 (H) 05/22/2020   HDL 82 05/22/2020   LDLCALC 157 (H) 05/22/2020   TRIG 126 05/22/2020   Lab Results  Component Value Date   WBC 6.1 01/24/2020   HGB 14.5 01/24/2020   HCT 43.9 01/24/2020   MCV 88 01/24/2020   PLT 307 01/24/2020    No results found for: IRON, TIBC, FERRITIN  Attestation Statements:   Reviewed by clinician on day of visit: allergies, medications, problem list, medical history, surgical history, family history, social history, and previous encounter notes.   I, Trixie Dredge, am acting as transcriptionist for Bufford Lope, MD.  I have reviewed the above documentation for accuracy and completeness, and I agree with the above. -  Dennard Nip, MD

## 2020-06-19 ENCOUNTER — Other Ambulatory Visit: Payer: Self-pay

## 2020-06-19 ENCOUNTER — Ambulatory Visit (INDEPENDENT_AMBULATORY_CARE_PROVIDER_SITE_OTHER): Payer: 59 | Admitting: Family Medicine

## 2020-06-19 ENCOUNTER — Encounter (INDEPENDENT_AMBULATORY_CARE_PROVIDER_SITE_OTHER): Payer: Self-pay | Admitting: Family Medicine

## 2020-06-19 VITALS — BP 130/75 | HR 80 | Temp 98.5°F | Ht 62.0 in | Wt 160.0 lb

## 2020-06-19 DIAGNOSIS — E559 Vitamin D deficiency, unspecified: Secondary | ICD-10-CM | POA: Diagnosis not present

## 2020-06-19 DIAGNOSIS — E669 Obesity, unspecified: Secondary | ICD-10-CM | POA: Diagnosis not present

## 2020-06-19 DIAGNOSIS — R7303 Prediabetes: Secondary | ICD-10-CM | POA: Diagnosis not present

## 2020-06-19 DIAGNOSIS — Z6830 Body mass index (BMI) 30.0-30.9, adult: Secondary | ICD-10-CM

## 2020-06-19 DIAGNOSIS — Z9189 Other specified personal risk factors, not elsewhere classified: Secondary | ICD-10-CM | POA: Diagnosis not present

## 2020-06-19 MED ORDER — VITAMIN D (ERGOCALCIFEROL) 1.25 MG (50000 UNIT) PO CAPS: 50000.0000 [IU] | ORAL_CAPSULE | ORAL | 0 refills | Status: DC

## 2020-06-19 MED ORDER — METFORMIN HCL 500 MG PO TABS: ORAL_TABLET | ORAL | 0 refills | Status: DC

## 2020-06-20 NOTE — Progress Notes (Signed)
Chief Complaint:   OBESITY Heather Moreno is here to discuss her progress with her obesity treatment plan along with follow-up of her obesity related diagnoses. Heather Moreno is on keeping a food journal and adhering to recommended goals of 1400 calories and 90 grams of protein daily and states she is following her eating plan approximately 60% of the time. Heather Moreno states she is walking for 30-40 minutes 3 times per week.  Today's visit was #: 12 Starting weight: 161 lbs Starting date: 06/30/2018 Today's weight: 160 lbs Today's date: 06/19/2020 Total lbs lost to date: 1 Total lbs lost since last in-office visit: 2  Interim History: Heather Moreno has done better with weight loss efforts in the last month. She still struggles to meet her protein goals at times but she has done well with walking.  Subjective:   1. Vitamin D deficiency Heather Moreno's Vit D level is not yet at goal. She has been out of her prescription for 1 month.  2. Pre-diabetes Heather Moreno's A1c is stable on metformin. She has questions about GLP-1 but she is very sensitive to many medications.  3. At risk for diabetes mellitus Heather Moreno is at higher than average risk for developing diabetes due to her obesity.   Assessment/Plan:   1. Vitamin D deficiency Low Vitamin D level contributes to fatigue and are associated with obesity, breast, and colon cancer. We will refill prescription Vitamin D for 1 month. Heather Moreno will follow-up for routine testing of Vitamin D, at least 2-3 times per year to avoid over-replacement. We will recheck labs in 3 months.   - Vitamin D, Ergocalciferol, (DRISDOL) 1.25 MG (50000 UNIT) CAPS capsule; Take 1 capsule (50,000 Units total) by mouth every 7 (seven) days.  Dispense: 4 capsule; Refill: 0  2. Pre-diabetes Heather Moreno will continue to work on weight loss, exercise, and decreasing simple carbohydrates to help decrease the risk of diabetes. We will refill metformin for 1 month, and we will recheck labs in 3 months.  - metFORMIN  (GLUCOPHAGE) 500 MG tablet; Take one tablet by mouth twice a day  Dispense: 60 tablet; Refill: 0  3. At risk for diabetes mellitus Heather Moreno was given approximately 15 minutes of diabetes education and counseling today. We discussed intensive lifestyle modifications today with an emphasis on weight loss as well as increasing exercise and decreasing simple carbohydrates in her diet. We also reviewed medication options with an emphasis on risk versus benefit of those discussed.   Repetitive spaced learning was employed today to elicit superior memory formation and behavioral change.  4. Class 1 obesity with serious comorbidity and body mass index (BMI) of 30.0 to 30.9 in adult, unspecified obesity type Heather Moreno is currently in the action stage of change. As such, her goal is to continue with weight loss efforts. She has agreed to keeping a food journal and adhering to recommended goals of 1400 calories and 90 grams of protein daily.   Exercise goals: As is.  Behavioral modification strategies: increasing lean protein intake.  Heather Moreno has agreed to follow-up with our clinic in 4 weeks. She was informed of the importance of frequent follow-up visits to maximize her success with intensive lifestyle modifications for her multiple health conditions.   Objective:   Blood pressure 130/75, pulse 80, temperature 98.5 F (36.9 C), temperature source Oral, height 5\' 2"  (1.575 m), weight 160 lb (72.6 kg), SpO2 98 %. Body mass index is 29.26 kg/m.  General: Cooperative, alert, well developed, in no acute distress. HEENT: Conjunctivae and lids unremarkable. Cardiovascular:  Regular rhythm.  Lungs: Normal work of breathing. Neurologic: No focal deficits.   Lab Results  Component Value Date   CREATININE 0.94 05/22/2020   BUN 21 05/22/2020   NA 137 05/22/2020   K 4.8 05/22/2020   CL 100 05/22/2020   CO2 24 05/22/2020   Lab Results  Component Value Date   ALT 41 (H) 05/22/2020   AST 27 05/22/2020    ALKPHOS 106 05/22/2020   BILITOT <0.2 05/22/2020   Lab Results  Component Value Date   HGBA1C 6.2 (H) 05/22/2020   HGBA1C 6.2 (H) 01/24/2020   HGBA1C 6.0 (H) 11/07/2018   HGBA1C 6.3 (H) 06/30/2018   Lab Results  Component Value Date   INSULIN 27.0 (H) 05/22/2020   INSULIN 28.5 (H) 01/24/2020   INSULIN 16.1 11/07/2018   INSULIN 25.3 (H) 06/30/2018   Lab Results  Component Value Date   TSH 2.460 01/24/2020   Lab Results  Component Value Date   CHOL 261 (H) 05/22/2020   HDL 82 05/22/2020   LDLCALC 157 (H) 05/22/2020   TRIG 126 05/22/2020   Lab Results  Component Value Date   WBC 6.1 01/24/2020   HGB 14.5 01/24/2020   HCT 43.9 01/24/2020   MCV 88 01/24/2020   PLT 307 01/24/2020   No results found for: IRON, TIBC, FERRITIN  Attestation Statements:   Reviewed by clinician on day of visit: allergies, medications, problem list, medical history, surgical history, family history, social history, and previous encounter notes.   I, Burt Knack, am acting as transcriptionist for Quillian Quince, MD.  I have reviewed the above documentation for accuracy and completeness, and I agree with the above. -  Quillian Quince, MD

## 2020-07-24 ENCOUNTER — Other Ambulatory Visit (INDEPENDENT_AMBULATORY_CARE_PROVIDER_SITE_OTHER): Payer: Self-pay | Admitting: Family Medicine

## 2020-07-24 ENCOUNTER — Other Ambulatory Visit: Payer: Self-pay

## 2020-07-24 ENCOUNTER — Ambulatory Visit (INDEPENDENT_AMBULATORY_CARE_PROVIDER_SITE_OTHER): Payer: 59 | Admitting: Family Medicine

## 2020-07-24 ENCOUNTER — Encounter (INDEPENDENT_AMBULATORY_CARE_PROVIDER_SITE_OTHER): Payer: Self-pay | Admitting: Family Medicine

## 2020-07-24 VITALS — BP 106/69 | HR 80 | Temp 98.0°F | Ht 62.0 in | Wt 158.0 lb

## 2020-07-24 DIAGNOSIS — R7303 Prediabetes: Secondary | ICD-10-CM | POA: Diagnosis not present

## 2020-07-24 DIAGNOSIS — E669 Obesity, unspecified: Secondary | ICD-10-CM

## 2020-07-24 DIAGNOSIS — E559 Vitamin D deficiency, unspecified: Secondary | ICD-10-CM

## 2020-07-24 DIAGNOSIS — Z9189 Other specified personal risk factors, not elsewhere classified: Secondary | ICD-10-CM

## 2020-07-24 DIAGNOSIS — Z6830 Body mass index (BMI) 30.0-30.9, adult: Secondary | ICD-10-CM

## 2020-07-24 MED ORDER — METFORMIN HCL 500 MG PO TABS: ORAL_TABLET | ORAL | 0 refills | Status: DC

## 2020-07-24 MED ORDER — VITAMIN D (ERGOCALCIFEROL) 1.25 MG (50000 UNIT) PO CAPS: 50000.0000 [IU] | ORAL_CAPSULE | ORAL | 0 refills | Status: DC

## 2020-07-24 NOTE — Progress Notes (Signed)
Chief Complaint:   OBESITY Heather Moreno is here to discuss her progress with her obesity treatment plan along with follow-up of her obesity related diagnoses. Heather Moreno is on keeping a food journal and adhering to recommended goals of 1400 calories and 90 grams of protein daily and states she is following her eating plan approximately 50% of the time. Heather Moreno states she is doing 0 minutes 0 times per week.  Today's visit was #: 13 Starting weight: 161 lbs Starting date: 06/30/2018 Today's weight: 158 lbs Today's date: 07/24/2020 Total lbs lost to date: 3 Total lbs lost since last in-office visit: 2  Interim History: Heather Moreno continues to do well with weight loss even with increased stress, both with family and at work.  Subjective:   1. Vitamin D deficiency Heather Moreno is stable on Vit D, and she denies nausea or vomiting.  2. Pre-diabetes Heather Moreno working on diet and weight loss, and she is stable on metformin.  3. At risk for diabetes mellitus Heather Moreno is at higher than average risk for developing diabetes due to her obesity.   Assessment/Plan:   1. Vitamin D deficiency Low Vitamin D level contributes to fatigue and are associated with obesity, breast, and colon cancer. We will refill prescription Vitamin D for 1 month. Olivya will follow-up for routine testing of Vitamin D, at least 2-3 times per year to avoid over-replacement. We will recheck labs next month.  - Vitamin D, Ergocalciferol, (DRISDOL) 1.25 MG (50000 UNIT) CAPS capsule; Take 1 capsule (50,000 Units total) by mouth every 7 (seven) days.  Dispense: 4 capsule; Refill: 0  2. Pre-diabetes Heather Moreno will continue to work on weight loss, exercise, and decreasing simple carbohydrates to help decrease the risk of diabetes. We will refill metformin for 1 month, and we will recheck labs next month.  - metFORMIN (GLUCOPHAGE) 500 MG tablet; Take one tablet by mouth twice a day  Dispense: 60 tablet; Refill: 0  3. At risk for diabetes mellitus Heather Moreno was  given approximately 15 minutes of diabetes education and counseling today. We discussed intensive lifestyle modifications today with an emphasis on weight loss as well as increasing exercise and decreasing simple carbohydrates in her diet. We also reviewed medication options with an emphasis on risk versus benefit of those discussed.   Repetitive spaced learning was employed today to elicit superior memory formation and behavioral change.  4. Class 1 obesity with serious comorbidity and body mass index (BMI) of 30.0 to 30.9 in adult, unspecified obesity type Heather Moreno is currently in the action stage of change. As such, her goal is to continue with weight loss efforts. She has agreed to keeping a food journal and adhering to recommended goals of 1400 calories and 90+ grams of protein daily.   Behavioral modification strategies: increasing lean protein intake and emotional eating strategies.  Heather Moreno has agreed to follow-up with our clinic in 4 weeks. She was informed of the importance of frequent follow-up visits to maximize her success with intensive lifestyle modifications for her multiple health conditions.   Objective:   Blood pressure 106/69, pulse 80, temperature 98 F (36.7 C), temperature source Oral, height 5\' 2"  (1.575 m), weight 158 lb (71.7 kg), SpO2 96 %. Body mass index is 28.9 kg/m.  General: Cooperative, alert, well developed, in no acute distress. HEENT: Conjunctivae and lids unremarkable. Cardiovascular: Regular rhythm.  Lungs: Normal work of breathing. Neurologic: No focal deficits.   Lab Results  Component Value Date   CREATININE 0.94 05/22/2020   BUN 21  05/22/2020   NA 137 05/22/2020   K 4.8 05/22/2020   CL 100 05/22/2020   CO2 24 05/22/2020   Lab Results  Component Value Date   ALT 41 (H) 05/22/2020   AST 27 05/22/2020   ALKPHOS 106 05/22/2020   BILITOT <0.2 05/22/2020   Lab Results  Component Value Date   HGBA1C 6.2 (H) 05/22/2020   HGBA1C 6.2 (H)  01/24/2020   HGBA1C 6.0 (H) 11/07/2018   HGBA1C 6.3 (H) 06/30/2018   Lab Results  Component Value Date   INSULIN 27.0 (H) 05/22/2020   INSULIN 28.5 (H) 01/24/2020   INSULIN 16.1 11/07/2018   INSULIN 25.3 (H) 06/30/2018   Lab Results  Component Value Date   TSH 2.460 01/24/2020   Lab Results  Component Value Date   CHOL 261 (H) 05/22/2020   HDL 82 05/22/2020   LDLCALC 157 (H) 05/22/2020   TRIG 126 05/22/2020   Lab Results  Component Value Date   WBC 6.1 01/24/2020   HGB 14.5 01/24/2020   HCT 43.9 01/24/2020   MCV 88 01/24/2020   PLT 307 01/24/2020   No results found for: IRON, TIBC, FERRITIN  Attestation Statements:   Reviewed by clinician on day of visit: allergies, medications, problem list, medical history, surgical history, family history, social history, and previous encounter notes.   I, Burt Knack, am acting as transcriptionist for Quillian Quince, MD.  I have reviewed the above documentation for accuracy and completeness, and I agree with the above. -  Quillian Quince, MD

## 2020-08-21 ENCOUNTER — Ambulatory Visit (INDEPENDENT_AMBULATORY_CARE_PROVIDER_SITE_OTHER): Payer: 59 | Admitting: Family Medicine

## 2020-08-21 ENCOUNTER — Other Ambulatory Visit: Payer: Self-pay

## 2020-08-21 ENCOUNTER — Encounter (INDEPENDENT_AMBULATORY_CARE_PROVIDER_SITE_OTHER): Payer: Self-pay | Admitting: Family Medicine

## 2020-08-21 VITALS — BP 109/71 | HR 92 | Temp 97.7°F | Ht 62.0 in | Wt 158.0 lb

## 2020-08-21 DIAGNOSIS — R7303 Prediabetes: Secondary | ICD-10-CM

## 2020-08-21 DIAGNOSIS — E559 Vitamin D deficiency, unspecified: Secondary | ICD-10-CM

## 2020-08-21 DIAGNOSIS — Z6830 Body mass index (BMI) 30.0-30.9, adult: Secondary | ICD-10-CM

## 2020-08-21 DIAGNOSIS — M7712 Lateral epicondylitis, left elbow: Secondary | ICD-10-CM

## 2020-08-21 DIAGNOSIS — E7849 Other hyperlipidemia: Secondary | ICD-10-CM | POA: Diagnosis not present

## 2020-08-21 DIAGNOSIS — Z9189 Other specified personal risk factors, not elsewhere classified: Secondary | ICD-10-CM

## 2020-08-21 DIAGNOSIS — E669 Obesity, unspecified: Secondary | ICD-10-CM

## 2020-08-21 MED ORDER — METFORMIN HCL 500 MG PO TABS: ORAL_TABLET | ORAL | 0 refills | Status: DC

## 2020-08-21 MED ORDER — VITAMIN D (ERGOCALCIFEROL) 1.25 MG (50000 UNIT) PO CAPS: 50000.0000 [IU] | ORAL_CAPSULE | ORAL | 0 refills | Status: DC

## 2020-08-22 LAB — CBC WITH DIFFERENTIAL/PLATELET
Basophils Absolute: 0.1 10*3/uL (ref 0.0–0.2)
Basos: 2 %
EOS (ABSOLUTE): 0.5 10*3/uL — ABNORMAL HIGH (ref 0.0–0.4)
Eos: 6 %
Hematocrit: 39.1 % (ref 34.0–46.6)
Hemoglobin: 12.6 g/dL (ref 11.1–15.9)
Immature Grans (Abs): 0 10*3/uL (ref 0.0–0.1)
Immature Granulocytes: 0 %
Lymphocytes Absolute: 1.7 10*3/uL (ref 0.7–3.1)
Lymphs: 23 %
MCH: 28.6 pg (ref 26.6–33.0)
MCHC: 32.2 g/dL (ref 31.5–35.7)
MCV: 89 fL (ref 79–97)
Monocytes Absolute: 0.7 10*3/uL (ref 0.1–0.9)
Monocytes: 9 %
Neutrophils Absolute: 4.3 10*3/uL (ref 1.4–7.0)
Neutrophils: 60 %
Platelets: 375 10*3/uL (ref 150–450)
RBC: 4.4 x10E6/uL (ref 3.77–5.28)
RDW: 13 % (ref 11.7–15.4)
WBC: 7.2 10*3/uL (ref 3.4–10.8)

## 2020-08-22 LAB — COMPREHENSIVE METABOLIC PANEL
ALT: 25 IU/L (ref 0–32)
AST: 17 IU/L (ref 0–40)
Albumin/Globulin Ratio: 1.5 (ref 1.2–2.2)
Albumin: 4.5 g/dL (ref 3.8–4.8)
Alkaline Phosphatase: 103 IU/L (ref 44–121)
BUN/Creatinine Ratio: 18 (ref 9–23)
BUN: 16 mg/dL (ref 6–24)
Bilirubin Total: 0.2 mg/dL (ref 0.0–1.2)
CO2: 24 mmol/L (ref 20–29)
Calcium: 9.5 mg/dL (ref 8.7–10.2)
Chloride: 102 mmol/L (ref 96–106)
Creatinine, Ser: 0.88 mg/dL (ref 0.57–1.00)
GFR calc Af Amer: 90 mL/min/{1.73_m2} (ref >59)
GFR calc non Af Amer: 78 mL/min/{1.73_m2} (ref >59)
Globulin, Total: 3.1 g/dL (ref 1.5–4.5)
Glucose: 105 mg/dL — ABNORMAL HIGH (ref 65–99)
Potassium: 5 mmol/L (ref 3.5–5.2)
Sodium: 140 mmol/L (ref 134–144)
Total Protein: 7.6 g/dL (ref 6.0–8.5)

## 2020-08-22 LAB — LIPID PANEL WITH LDL/HDL RATIO
Cholesterol, Total: 253 mg/dL — ABNORMAL HIGH (ref 100–199)
HDL: 76 mg/dL (ref >39)
LDL Chol Calc (NIH): 160 mg/dL — ABNORMAL HIGH (ref 0–99)
LDL/HDL Ratio: 2.1 ratio (ref 0.0–3.2)
Triglycerides: 98 mg/dL (ref 0–149)
VLDL Cholesterol Cal: 17 mg/dL (ref 5–40)

## 2020-08-22 LAB — HEMOGLOBIN A1C
Est. average glucose Bld gHb Est-mCnc: 126 mg/dL
Hgb A1c MFr Bld: 6 % — ABNORMAL HIGH (ref 4.8–5.6)

## 2020-08-22 LAB — INSULIN, RANDOM: INSULIN: 23 u[IU]/mL (ref 2.6–24.9)

## 2020-08-22 LAB — VITAMIN D 25 HYDROXY (VIT D DEFICIENCY, FRACTURES): Vit D, 25-Hydroxy: 33.8 ng/mL (ref 30.0–100.0)

## 2020-08-22 NOTE — Progress Notes (Signed)
Chief Complaint:   OBESITY Heather Moreno is here to discuss her progress with her obesity treatment plan along with follow-up of her obesity related diagnoses. Heather Moreno is on keeping a food journal and adhering to recommended goals of 1400 calories and 90+ grams of protein daily and states she is following her eating plan approximately 60% of the time. Heather Moreno states she is walking 4,000 steps daily, and walking intermittently for 40 minutes 3 times per week.  Today's visit was #: 14 Starting weight: 161 lbs Starting date: 06/30/2018 Today's weight: 158 lbs Today's date: 08/21/2020 Total lbs lost to date: 3 Total lbs lost since last in-office visit: 0  Interim History: [name] has done well maintaining her weight since her last visit, but she is struggling with exercise due to causing migraine.  Subjective:   1. Left tennis elbow Heather Moreno has had a recurrent left lateral elbow pain.  2. Other hyperlipidemia Heather Moreno is working on diet and weight loss, and she is due for labs.  3. Vitamin D deficiency Heather Moreno is due for labs, and she denies nausea or vomiting.  4. Pre-diabetes Heather Moreno is due for labs, and she denies nausea or vomiting. She is working on diet and weight loss.  5. At risk for dehydration Heather Moreno is at risk for dehydration due to inadequate water intake.  Assessment/Plan:   1. Left tennis elbow We will refer to [facility] for treatment, and [name] will follow up as directed.  - Ambulatory Referral to Neuro Rehab  2. Other hyperlipidemia Cardiovascular risk and specific lipid/LDL goals reviewed. We discussed several lifestyle modifications today and [name] will continue to work on diet, exercise and weight loss efforts. We will check labs today. Orders and follow up as documented in patient record.   - Lipid Panel With LDL/HDL Ratio  3. Vitamin D deficiency Low Vitamin D level contributes to fatigue and are associated with obesity, breast, and colon cancer. We will check  labs today, and we will refill prescription Vitamin D for 1 month. [name] will follow-up for routine testing of Vitamin D, at least 2-3 times per year to avoid over-replacement.  - Vitamin D, Ergocalciferol, (DRISDOL) 1.25 MG (50000 UNIT) CAPS capsule; Take 1 capsule (50,000 Units total) by mouth every 7 (seven) days.  Dispense: 4 capsule; Refill: 0 - VITAMIN D 25 Hydroxy (Vit-D Deficiency, Fractures)  4. Pre-diabetes Heather Moreno will continue to work on weight loss, exercise, and decreasing simple carbohydrates to help decrease the risk of diabetes. We will check labs today, and we will refill metformin for 1 month.  - metFORMIN (GLUCOPHAGE) 500 MG tablet; Take one tablet by mouth twice a day  Dispense: 60 tablet; Refill: 0 - Comprehensive metabolic panel - Insulin, random - Hemoglobin A1c - CBC with Differential/Platelet  5. At risk for dehydration Heather Moreno was given approximately 15 minutes dehydration prevention counseling today. [name] is at risk for dehydration due to weight loss and current medication(s). She was encouraged to hydrate and monitor fluid status to avoid dehydration as well as weight loss plateaus.   6. Class 1 obesity with serious comorbidity and body mass index (BMI) of 30.0 to 30.9 in adult, unspecified obesity type Heather Moreno is currently in the action stage of change. As such, her goal is to continue with weight loss efforts. She has agreed to keeping a food journal and adhering to recommended goals of 1400 calories and 90+ grams of protein daily.   Exercise goals: As is.  Behavioral modification strategies: increasing lean protein  intake and increasing water intake.  Heather Moreno has agreed to follow-up with our clinic in 4 weeks. She was informed of the importance of frequent follow-up visits to maximize her success with intensive lifestyle modifications for her multiple health conditions.   Heather Moreno was informed we would discuss her lab results at her next visit unless there is a  critical issue that needs to be addressed sooner. Heather Moreno agreed to keep her next visit at the agreed upon time to discuss these results.  Objective:   Blood pressure 109/71, pulse 92, temperature 97.7 F (36.5 C), height 5\' 2"  (1.575 m), weight 158 lb (71.7 kg), SpO2 97 %. Body mass index is 28.9 kg/m.  General: Cooperative, alert, well developed, in no acute distress. HEENT: Conjunctivae and lids unremarkable. Cardiovascular: Regular rhythm.  Lungs: Normal work of breathing. Neurologic: No focal deficits.   Lab Results  Component Value Date   CREATININE 0.88 08/21/2020   BUN 16 08/21/2020   NA 140 08/21/2020   K 5.0 08/21/2020   CL 102 08/21/2020   CO2 24 08/21/2020   Lab Results  Component Value Date   ALT 25 08/21/2020   AST 17 08/21/2020   ALKPHOS 103 08/21/2020   BILITOT 0.2 08/21/2020   Lab Results  Component Value Date   HGBA1C 6.0 (H) 08/21/2020   HGBA1C 6.2 (H) 05/22/2020   HGBA1C 6.2 (H) 01/24/2020   HGBA1C 6.0 (H) 11/07/2018   HGBA1C 6.3 (H) 06/30/2018   Lab Results  Component Value Date   INSULIN 23.0 08/21/2020   INSULIN 27.0 (H) 05/22/2020   INSULIN 28.5 (H) 01/24/2020   INSULIN 16.1 11/07/2018   INSULIN 25.3 (H) 06/30/2018   Lab Results  Component Value Date   TSH 2.460 01/24/2020   Lab Results  Component Value Date   CHOL 253 (H) 08/21/2020   HDL 76 08/21/2020   LDLCALC 160 (H) 08/21/2020   TRIG 98 08/21/2020   Lab Results  Component Value Date   WBC 7.2 08/21/2020   HGB 12.6 08/21/2020   HCT 39.1 08/21/2020   MCV 89 08/21/2020   PLT 375 08/21/2020   No results found for: IRON, TIBC, FERRITIN  Attestation Statements:   Reviewed by clinician on day of visit: allergies, medications, problem list, medical history, surgical history, family history, social history, and previous encounter notes.   I, 08/23/2020, am acting as transcriptionist for Burt Knack, MD.  I have reviewed the above documentation for accuracy and  completeness, and I agree with the above. -  Quillian Quince, MD

## 2020-08-26 ENCOUNTER — Encounter (INDEPENDENT_AMBULATORY_CARE_PROVIDER_SITE_OTHER): Payer: Self-pay

## 2020-09-16 ENCOUNTER — Encounter: Payer: Self-pay | Admitting: Occupational Therapy

## 2020-09-16 ENCOUNTER — Other Ambulatory Visit: Payer: Self-pay

## 2020-09-16 ENCOUNTER — Ambulatory Visit: Payer: 59 | Attending: Family Medicine | Admitting: Occupational Therapy

## 2020-09-16 DIAGNOSIS — M6281 Muscle weakness (generalized): Secondary | ICD-10-CM | POA: Diagnosis present

## 2020-09-16 DIAGNOSIS — M25522 Pain in left elbow: Secondary | ICD-10-CM

## 2020-09-16 NOTE — Therapy (Addendum)
[facility] [address] [place], [place], [address] Phone: [phone]   Fax:  [phone]  Occupational Therapy Evaluation  Patient Details  Name: Heather Moreno MRN: [id number] Date of Birth: 1971-06-06 Referring Provider (OT): Dr. Quillian Quince   Encounter Date: 09/16/2020   OT End of Session - 09/16/20 1609    Visit Number 1    Number of Visits 17    Date for OT Re-Evaluation 11/15/20    Authorization Type awaiting insurance verification    OT Start Time 0720    OT Stop Time 0803    OT Time Calculation (min) 43 min    Activity Tolerance Patient tolerated treatment well    Behavior During Therapy [facility] for tasks assessed/performed           Past Medical History:  Diagnosis Date  . Allergic rhinitis   . Complication of anesthesia    difficulty with weakness after spinal  . Environmental allergies   . GERD (gastroesophageal reflux disease)   . Headache(784.0)   . HLD (hyperlipidemia)   . IBS (irritable bowel syndrome)   . Lactose intolerance   . Migraine headache with aura   . Vitamin D deficiency     Past Surgical History:  Procedure Laterality Date  . cesarean sections  2009, 2003  . NASAL SINUS SURGERY Bilateral 1989    There were no vitals filed for this visit.   Subjective Assessment - 09/16/20 0723    Subjective  Pt reports significant pain at times.  Has tried carpal tunnel splint and counterforce band, but not consistently.    Pertinent History L tennis elbow/lateral elbow pain.        PMH:  hyperlipidemia, Vitamin D Deficiency, hx of headaches/migraine, hx of transverse myelitis    Patient Stated Goals improve pain    Currently in Pain? Yes    Pain Score --   0-7/10   Pain Location Elbow    Pain Orientation Left    Pain Descriptors / Indicators Sharp    Pain Type Acute pain    Pain Onset More than a month ago    Pain Frequency Intermittent    Aggravating Factors  certain movements,  wrist extension    Pain Relieving Factors rest (has also used advil and voltaren gel)             [facility] OT Assessment - 09/16/20 0001      Assessment   Medical Diagnosis L tennis elbow / L lateral elbow pain    Referring Provider (OT) Dr. 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  Mobility   Mobility Status Independent      Sensation   Additional Comments Pt point tender at L lateral epicondyle      Edema   Edema none noted      ROM / Strength   AROM / PROM / Strength Strength      Palpation   Palpation comment Pt is point tender at L lateral epicondyle      AROM   Overall AROM Comments grossly WNL; however, pt reports pain with wrist extension, supination (particularly with elbow extension), and mild with full wrist flex      Strength   Overall Strength Comments functional difficulty lifting due to pain      Hand Function   Left Hand Grip (lbs) NT due to time constraints                      OT Education - 09/16/20 1604    Education Details Discussed OT POC (possible Iontophoresis use).  Initial Lateral Epicondylitis Phase I:  heat, massage, recommended consistent splint wear during the day and night (after pt fitted for  L pre-fab wrist cock-up splint), instructed in use of counterforce band positioning (recommended consistent wear during daytime activities) and II AROM stretches #1 & 2 (elbow flex with wrist flex in forearm neutral and elbow flex with wrist flex in pronation).  Began education regarding activity modification including neutral wrist with picking up/lifting objects, positions to avoid during ADLs, and recommended use of speakerphone/headphones during longer phone conversations, avoid resistive/repetitive activities as able    Person(s) Educated Patient    Methods Explanation;Demonstration;Verbal cues;Handout    Comprehension Verbalized understanding;Returned demonstration            Treatment:   Ultrasound x to L lateral forearm , 0.8wts/cm2, continuous with no adverse reactions for pain.   Pt fitted for pre-fab L wrist cock-up splint (d-ring).        OT Short Term Goals - 09/16/20 1925      OT SHORT TERM GOAL #1   Title Pt will be independent with stretching HEP.--check STGs 10/16/20    Time 4    Period Weeks    Status New      OT SHORT TERM GOAL #2   Title Pt will be independent with activity modifications/improved positioning for ADLs and IADLs to decr pain.    Time 4    Period Weeks    Status New      OT SHORT TERM GOAL #3   Title Pt will be indepedent with splint and counterforce band wear/care.    Time 4    Period Weeks    Status New      OT SHORT TERM GOAL #4   Title Pt will report pain consistently less than or equal to 3/10 for light ADLs/IADLs.    Time 4    Period Weeks    Status New             OT Long Term Goals - 09/16/20 1927      OT LONG TERM GOAL #1   Title Pt will be indepedent with strengthening HEP.    Time 8    Period Weeks    Status New      OT LONG TERM GOAL #2   Title Pt will be able to lift dishes with LUE without pain.    Time 8    Period Weeks    Status New      OT  LONG TERM GOAL #3   Title Pt will be able to perform  supination with elbow extended to assist with patient evalutions without pain.    Time 8    Period Weeks    Status New      OT LONG TERM GOAL #4   Title Pt will report pain consistently less than or equal to 1/10 for typical ADLs/IADLs including sleep.    Time 8    Period Weeks    Status New      OT LONG TERM GOAL #5   Title Assess L grip strength and establish goal prn.    Time 8    Period Weeks    Status New                 Plan - 09/16/20 1609    Clinical Impression Statement Pt is a 49 y.o. female referred to occupational therapy for L tennis elbow/lateral elbow pain.  Pt reports that pain has been going on for several weeks and is severe at times.  Pt reports pain/difficulty with lifting with LUE and certain movements during the day and at night.  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  Pt presents with pain, with decr strength due to pain, and decr LUE functional use.  Pt will benefit from ocupational therapy to address these deficits for improved LUE functional use and quality of life.    OT Occupational Profile and History Problem Focused Assessment - Including review of records relating to presenting problem    Occupational performance deficits (Please refer to evaluation for details): ADL's;IADL's;Work;Leisure;Rest and Sleep    Body Structure / Function / Physical Skills ADL;Strength;UE functional use;Decreased knowledge of use of DME;Decreased knowledge of precautions;Pain;Body mechanics;IADL    Rehab Potential Good    Clinical Decision Making Limited treatment options, no task modification necessary    Comorbidities Affecting Occupational Performance: None    Modification or Assistance to Complete Evaluation  No modification of tasks or assist necessary to complete eval    OT Frequency 2x / week    OT Duration 8 weeks   +eval; however, may be able to  decr frequency depending on progress   OT Treatment/Interventions Self-care/ADL training;Moist Heat;Fluidtherapy;DME and/or AE instruction;Splinting;Therapeutic activities;Contrast Bath;Ultrasound;Therapeutic exercise;Passive range of motion;Iontophoresis;Cryotherapy;Electrical Stimulation;Paraffin;Manual Therapy;Patient/family education    Plan review and progress HEP as able, ultrasound, (? order for Ionto if pt desires to pursue), continue with activity modification education prn, assess grip strength, check splint and counterforce band wear/fit prn    Consulted and Agree with Plan of Care Patient           Patient will benefit from skilled therapeutic intervention in order to improve the following deficits and impairments:   Body Structure / Function / Physical Skills: ADL, Strength, UE functional use, Decreased knowledge of use of DME, Decreased knowledge of precautions, Pain, Body mechanics, IADL       Visit Diagnosis: Pain in left elbow  Muscle weakness (generalized)    Problem List Patient Active Problem List   Diagnosis Date Noted  . Vitamin D deficiency 07/14/2018  . Prediabetes 07/14/2018  . NAFLD (nonalcoholic fatty liver disease) 70/35/0093  . Multiple sclerosis (HCC) 02/24/2013  . Erythema nodosum 02/13/2013  . Acute transverse myelitis (HCC) 02/13/2013    [facility] 09/16/2020, 7:43 PM  [facility] [address] [place], [place], [address] Phone:  [phone]   Fax:  [phone]  Name: Heather Moreno MRN: [id number] Date of Birth: 23-Nov-1971   Willa Frater, OTR/L [facility] [address] [place], [place]  [address] [phone] phone [phone] 09/16/20 7:43 PM

## 2020-09-16 NOTE — Therapy (Deleted)
[facility] [address] [place], [place], [address] Phone: [phone]   Fax:  [phone]  Occupational Therapy Evaluation  Patient Details  Name: Heather Moreno MRN: [id number] Date of Birth: 1971-06-06 Referring Provider (OT): Dr. Quillian Quince   Encounter Date: 09/16/2020   OT End of Session - 09/16/20 1609    Visit Number 1    Number of Visits 17    Date for OT Re-Evaluation 11/15/20    Authorization Type awaiting insurance verification    OT Start Time 0720    OT Stop Time 0803    OT Time Calculation (min) 43 min    Activity Tolerance Patient tolerated treatment well    Behavior During Therapy [facility] for tasks assessed/performed           Past Medical History:  Diagnosis Date  . Allergic rhinitis   . Complication of anesthesia    difficulty with weakness after spinal  . Environmental allergies   . GERD (gastroesophageal reflux disease)   . Headache(784.0)   . HLD (hyperlipidemia)   . IBS (irritable bowel syndrome)   . Lactose intolerance   . Migraine headache with aura   . Vitamin D deficiency     Past Surgical History:  Procedure Laterality Date  . cesarean sections  2009, 2003  . NASAL SINUS SURGERY Bilateral 1989    There were no vitals filed for this visit.   Subjective Assessment - 09/16/20 0723    Subjective  Pt reports significant pain at times.  Has tried carpal tunnel splint and counterforce band, but not consistently.    Pertinent History L tennis elbow/lateral elbow pain.        PMH:  hyperlipidemia, Vitamin D Deficiency, hx of headaches/migraine, hx of transverse myelitis    Patient Stated Goals improve pain    Currently in Pain? Yes    Pain Score --   0-7/10   Pain Location Elbow    Pain Orientation Left    Pain Descriptors / Indicators Sharp    Pain Type Acute pain    Pain Onset More than a month ago    Pain Frequency Intermittent    Aggravating Factors  certain movements,  wrist extension    Pain Relieving Factors rest (has also used advil and voltaren gel)             [facility] OT Assessment - 09/16/20 0001      Assessment   Medical Diagnosis L tennis elbow / L lateral elbow pain    Referring Provider (OT) Dr. 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  Mobility   Mobility Status Independent      Sensation   Additional Comments Pt point tender at L lateral epicondyle      Edema   Edema none noted      ROM / Strength   AROM / PROM / Strength Strength      Palpation   Palpation comment Pt is point tender at L lateral epicondyle      AROM   Overall AROM Comments grossly WNL; however, pt reports pain with wrist extension, supination (particularly with elbow extension), and mild with full wrist flex      Strength   Overall Strength Comments functional difficulty lifting due to pain      Hand Function   Left Hand Grip (lbs) NT due to time constraints                           OT Education - 09/16/20 1604    Education Details Discussed OT POC (possible Iontophoresis use).  Initial Lateral Epicondylitis Phase I:  heat, massage, recommended consistent splint wear during the day and night (after pt  fitted for L pre-fab wrist cock-up splint), instructed in use of counterforce band positioning (recommended consistent wear during daytime activities) and II AROM stretches #1 & 2 (elbow flex with wrist flex in forearm neutral and elbow flex with wrist flex in pronation).  Began education regarding activity modification including neutral wrist with picking up/lifting objects, positions to avoid during ADLs, and recommended use of speakerphone/headphones during longer phone conversations, avoid resistive/repetitive activities as able    Person(s) Educated Patient    Methods Explanation;Demonstration;Verbal cues;Handout    Comprehension Verbalized understanding;Returned demonstration            Treatment:   Ultrasound x to L lateral forearm , 0.8wts/cm2, continuous with no adverse reactions for pain.   Pt fitted for pre-fab L wrist cock-up splint (d-ring).       OT Short Term Goals - 09/16/20 1925      OT SHORT TERM GOAL #1   Title Pt will be independent with stretching HEP.--check STGs 10/16/20    Time 4    Period Weeks    Status New      OT SHORT TERM GOAL #2   Title Pt will be independent with activity modifications/improved positioning for ADLs and IADLs to decr pain.    Time 4    Period Weeks    Status New      OT SHORT TERM GOAL #3   Title Pt will be indepedent with splint and counterforce band wear/care.    Time 4    Period Weeks    Status New      OT SHORT TERM GOAL #4   Title Pt will report pain consistently less than or equal to 3/10 for light ADLs/IADLs.    Time 4    Period Weeks    Status New             OT Long Term Goals - 09/16/20 1927      OT LONG TERM GOAL #1   Title Pt will be indepedent with strengthening HEP.    Time 8    Period Weeks    Status New      OT LONG TERM GOAL #2   Title Pt will be able to lift dishes with LUE without pain.    Time 8    Period Weeks    Status New  OT LONG TERM GOAL #3   Title Pt will be able  to perform supination with elbow extended to assist with patient evalutions without pain.    Time 8    Period Weeks    Status New      OT LONG TERM GOAL #4   Title Pt will report pain consistently less than or equal to 1/10 for typical ADLs/IADLs including sleep.    Time 8    Period Weeks    Status New      OT LONG TERM GOAL #5   Title Assess L grip strength and establish goal prn.    Time 8    Period Weeks    Status New                 Plan - 09/16/20 1609    Clinical Impression Statement Pt is a 49 y.o. female referred to occupational therapy for L tennis elbow/lateral elbow pain.  Pt reports that pain has been going on for several weeks and is severe at times.  Pt reports pain/difficulty with lifting with LUE and certain movements during the day and at night.  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  Pt presents with pain, with decr strength due to pain, and decr LUE functional use.  Pt will benefit from ocupational therapy to address these deficits for improved LUE functional use and quality of life.    OT Occupational Profile and History Problem Focused Assessment - Including review of records relating to presenting problem    Occupational performance deficits (Please refer to evaluation for details): ADL's;IADL's;Work;Leisure;Rest and Sleep    Body Structure / Function / Physical Skills ADL;Strength;UE functional use;Decreased knowledge of use of DME;Decreased knowledge of precautions;Pain;Body mechanics;IADL    Rehab Potential Good    Clinical Decision Making Limited treatment options, no task modification necessary    Comorbidities Affecting Occupational Performance: None    Modification or Assistance to Complete Evaluation  No modification of tasks or assist necessary to complete eval    OT Frequency 2x / week    OT Duration 8 weeks   +eval; however, may  be able to decr frequency depending on progress   OT Treatment/Interventions Self-care/ADL training;Moist Heat;Fluidtherapy;DME and/or AE instruction;Splinting;Therapeutic activities;Contrast Bath;Ultrasound;Therapeutic exercise;Passive range of motion;Iontophoresis;Cryotherapy;Electrical Stimulation;Paraffin;Manual Therapy;Patient/family education    Plan review and progress HEP as able, ultrasound, (? order for Ionto if pt desires to pursue), continue with activity modification education prn, assess grip strength, check splint and counterforce band wear/fit prn    Consulted and Agree with Plan of Care Patient           Patient will benefit from skilled therapeutic intervention in order to improve the following deficits and impairments:   Body Structure / Function / Physical Skills: ADL, Strength, UE functional use, Decreased knowledge of use of DME, Decreased knowledge of precautions, Pain, Body mechanics, IADL       Visit Diagnosis: Pain in left elbow  Muscle weakness (generalized)    Problem List Patient Active Problem List   Diagnosis Date Noted  . Vitamin D deficiency 07/14/2018  . Prediabetes 07/14/2018  . NAFLD (nonalcoholic fatty liver disease) 59/16/3846  . Multiple sclerosis (HCC) 02/24/2013  . Erythema nodosum 02/13/2013  . Acute transverse myelitis (HCC) 02/13/2013    [facility] 09/16/2020, 7:42 PM  [facility] [address] [place], [place], [address]  Phone: [phone]   Fax:  [phone]  Name: Heather Moreno MRN: [id number] Date of Birth: [birth year]   Willa Frater, OTR/L [facility] [address] [place], [place]  [address] [phone] phone [phone] 09/16/20 7:42 PM

## 2020-09-16 NOTE — Addendum Note (Signed)
Addended by: Willa Frater D on: 09/16/2020 07:44 PM   Modules accepted: Orders

## 2020-09-16 NOTE — Therapy (Deleted)
[facility] [address] [place], [place], [address] Phone: [phone]   Fax:  [phone]  Occupational Therapy Evaluation  Patient Details  Name: Heather Moreno MRN: [id number] Date of Birth: 1971-06-06 Referring Provider (OT): Dr. Quillian Quince   Encounter Date: 09/16/2020   OT End of Session - 09/16/20 1609    Visit Number 1    Number of Visits 17    Date for OT Re-Evaluation 11/15/20    Authorization Type awaiting insurance verification    OT Start Time 0720    OT Stop Time 0803    OT Time Calculation (min) 43 min    Activity Tolerance Patient tolerated treatment well    Behavior During Therapy [facility] for tasks assessed/performed           Past Medical History:  Diagnosis Date  . Allergic rhinitis   . Complication of anesthesia    difficulty with weakness after spinal  . Environmental allergies   . GERD (gastroesophageal reflux disease)   . Headache(784.0)   . HLD (hyperlipidemia)   . IBS (irritable bowel syndrome)   . Lactose intolerance   . Migraine headache with aura   . Vitamin D deficiency     Past Surgical History:  Procedure Laterality Date  . cesarean sections  2009, 2003  . NASAL SINUS SURGERY Bilateral 1989    There were no vitals filed for this visit.   Subjective Assessment - 09/16/20 0723    Subjective  Pt reports significant pain at times.  Has tried carpal tunnel splint and counterforce band, but not consistently.    Pertinent History L tennis elbow/lateral elbow pain.        PMH:  hyperlipidemia, Vitamin D Deficiency, hx of headaches/migraine, hx of transverse myelitis    Patient Stated Goals improve pain    Currently in Pain? Yes    Pain Score --   0-7/10   Pain Location Elbow    Pain Orientation Left    Pain Descriptors / Indicators Sharp    Pain Type Acute pain    Pain Onset More than a month ago    Pain Frequency Intermittent    Aggravating Factors  certain movements,  wrist extension    Pain Relieving Factors rest (has also used advil and voltaren gel)             [facility] OT Assessment - 09/16/20 0001      Assessment   Medical Diagnosis L tennis elbow / L lateral elbow pain    Referring Provider (OT) Dr. 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  Mobility   Mobility Status Independent      Sensation   Additional Comments Pt point tender at L lateral epicondyle      Edema   Edema none noted      ROM / Strength   AROM / PROM / Strength Strength      Palpation   Palpation comment Pt is point tender at L lateral epicondyle      AROM   Overall AROM Comments grossly WNL; however, pt reports pain with wrist extension, supination (particularly with elbow extension), and mild with full wrist flex      Strength   Overall Strength Comments functional difficulty lifting due to pain      Hand Function   Left Hand Grip (lbs) NT due to time constraints                      OT Education - 09/16/20 1604    Education Details Discussed OT POC (possible Iontophoresis use).  Initial Lateral Epicondylitis Phase I:  heat, massage, recommended consistent splint wear during the day and night (after pt fitted for  L pre-fab wrist cock-up splint), instructed in use of counterforce band positioning (recommended consistent wear during daytime activities) and II AROM stretches #1 & 2 (elbow flex with wrist flex in forearm neutral and elbow flex with wrist flex in pronation).  Began education regarding activity modification including neutral wrist with picking up/lifting objects, positions to avoid during ADLs, and recommended use of speakerphone/headphones during longer phone conversations, avoid resistive/repetitive activities as able    Person(s) Educated Patient    Methods Explanation;Demonstration;Verbal cues;Handout    Comprehension Verbalized understanding;Returned demonstration            Treatment:   Ultrasound x to L lateral forearm , 0.8wts/cm2, continuous with no adverse reactions for pain.   Pt fitted for pre-fab L wrist cock-up splint (d-ring).        OT Short Term Goals - 09/16/20 1925      OT SHORT TERM GOAL #1   Title Pt will be independent with stretching HEP.--check STGs 10/16/20    Time 4    Period Weeks    Status New      OT SHORT TERM GOAL #2   Title Pt will be independent with activity modifications/improved positioning for ADLs and IADLs to decr pain.    Time 4    Period Weeks    Status New      OT SHORT TERM GOAL #3   Title Pt will be indepedent with splint and counterforce band wear/care.    Time 4    Period Weeks    Status New      OT SHORT TERM GOAL #4   Title Pt will report pain consistently less than or equal to 3/10 for light ADLs/IADLs.    Time 4    Period Weeks    Status New             OT Long Term Goals - 09/16/20 1927      OT LONG TERM GOAL #1   Title Pt will be indepedent with strengthening HEP.    Time 8    Period Weeks    Status New      OT LONG TERM GOAL #2   Title Pt will be able to lift dishes with LUE without pain.    Time 8    Period Weeks    Status New      OT  LONG TERM GOAL #3   Title Pt will be able to perform  supination with elbow extended to assist with patient evalutions without pain.    Time 8    Period Weeks    Status New      OT LONG TERM GOAL #4   Title Pt will report pain consistently less than or equal to 1/10 for typical ADLs/IADLs including sleep.    Time 8    Period Weeks    Status New      OT LONG TERM GOAL #5   Title Assess L grip strength and establish goal prn.    Time 8    Period Weeks    Status New                 Plan - 09/16/20 1609    Clinical Impression Statement Pt is a 49 y.o. female referred to occupational therapy for L tennis elbow/lateral elbow pain.  Pt reports that pain has been going on for several weeks and is severe at times.  Pt reports pain/difficulty with lifting with LUE and certain movements during the day and at night.  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  Pt presents with pain, with decr strength due to pain, and decr LUE functional use.  Pt will benefit from ocupational therapy to address these deficits for improved LUE functional use and quality of life.    OT Occupational Profile and History Problem Focused Assessment - Including review of records relating to presenting problem    Occupational performance deficits (Please refer to evaluation for details): ADL's;IADL's;Work;Leisure;Rest and Sleep    Body Structure / Function / Physical Skills ADL;Strength;UE functional use;Decreased knowledge of use of DME;Decreased knowledge of precautions;Pain;Body mechanics;IADL    Rehab Potential Good    Clinical Decision Making Limited treatment options, no task modification necessary    Comorbidities Affecting Occupational Performance: None    Modification or Assistance to Complete Evaluation  No modification of tasks or assist necessary to complete eval    OT Frequency 2x / week    OT Duration 8 weeks   +eval; however, may be able to  decr frequency depending on progress   OT Treatment/Interventions Self-care/ADL training;Moist Heat;Fluidtherapy;DME and/or AE instruction;Splinting;Therapeutic activities;Contrast Bath;Ultrasound;Therapeutic exercise;Passive range of motion;Iontophoresis;Cryotherapy;Electrical Stimulation;Paraffin;Manual Therapy;Patient/family education    Plan review and progress HEP as able, ultrasound, (? order for Ionto if pt desires to pursue), continue with activity modification education prn, assess grip strength, check splint and counterforce band wear/fit prn    Consulted and Agree with Plan of Care Patient           Patient will benefit from skilled therapeutic intervention in order to improve the following deficits and impairments:   Body Structure / Function / Physical Skills: ADL, Strength, UE functional use, Decreased knowledge of use of DME, Decreased knowledge of precautions, Pain, Body mechanics, IADL       Visit Diagnosis: Pain in left elbow  Muscle weakness (generalized)    Problem List Patient Active Problem List   Diagnosis Date Noted  . Vitamin D deficiency 07/14/2018  . Prediabetes 07/14/2018  . NAFLD (nonalcoholic fatty liver disease) 16/08/9603  . Multiple sclerosis (HCC) 02/24/2013  . Erythema nodosum 02/13/2013  . Acute transverse myelitis (HCC) 02/13/2013    [facility] 09/16/2020, 7:41 PM  [facility] [address] [place], [place], [address] Phone:  [phone]   Fax:  [phone]  Name: Heather Moreno MRN: [id number] Date of Birth: [birth year]   Willa Frater, OTR/L [facility] [address] [place], [place]  [address] [phone] phone [phone] 09/16/20 7:41 PM

## 2020-09-23 ENCOUNTER — Ambulatory Visit (INDEPENDENT_AMBULATORY_CARE_PROVIDER_SITE_OTHER): Payer: 59 | Admitting: Family Medicine

## 2020-09-23 ENCOUNTER — Telehealth: Payer: Self-pay | Admitting: Occupational Therapy

## 2020-09-23 ENCOUNTER — Ambulatory Visit: Payer: 59 | Admitting: Occupational Therapy

## 2020-09-23 ENCOUNTER — Other Ambulatory Visit: Payer: Self-pay

## 2020-09-23 ENCOUNTER — Encounter: Payer: Self-pay | Admitting: Occupational Therapy

## 2020-09-23 DIAGNOSIS — M25522 Pain in left elbow: Secondary | ICD-10-CM

## 2020-09-23 DIAGNOSIS — M6281 Muscle weakness (generalized): Secondary | ICD-10-CM

## 2020-09-23 NOTE — Telephone Encounter (Signed)
   Dr. Dalbert Garnet,  [name] may benefit from Iontophoresis with dexamethasone  for her L elbow pain.  If you agree, could you please enter order via Epic "ordering/authorizing the use of Iontophoresis using 4 mg/mL of dexamethasone to L elbow"  See my occupational therapy evaluation/initial plan of care in Epic  for details and sign if you agree.  If you have any questions, please feel free to contact me.  Thank you,  Willa Frater, OTR/L [facility] [address] [place], [place]  [address] [phone] phone [phone] 09/23/20 2:56 PM

## 2020-09-23 NOTE — Therapy (Addendum)
[facility] [address] [place], [place], [address] Phone: [phone]   Fax:  [phone]  Occupational Therapy Treatment  Patient Details  Name: Heather Moreno MRN: [id number] Date of Birth: 1971-11-05 Referring Provider (OT): Dr. Dennard Nip   Encounter Date: 09/23/2020   OT End of Session - 09/23/20 1239    Visit Number 2    Number of Visits 17    Date for OT Re-Evaluation 11/15/20    Authorization Type [employer] 60 visit limit combined OT/PT, covered 100% after deductible met    OT Start Time 0720    OT Stop Time 0805    OT Time Calculation (min) 45 min    Activity Tolerance Patient tolerated treatment well    Behavior During Therapy [facility] for tasks assessed/performed           Past Medical History:  Diagnosis Date  . Allergic rhinitis   . Complication of anesthesia    difficulty with weakness after spinal  . Environmental allergies   . GERD (gastroesophageal reflux disease)   . Headache(784.0)   . HLD (hyperlipidemia)   . IBS (irritable bowel syndrome)   . Lactose intolerance   . Migraine headache with aura   . Vitamin D deficiency     Past Surgical History:  Procedure Laterality Date  . cesarean sections  2009, 2003  . NASAL SINUS SURGERY Bilateral 1989    There were no vitals filed for this visit.   Subjective Assessment - 09/23/20 1236    Subjective  Pt reports pain is about the same overall, but may be a little better at work with splint.  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        PMH:  hyperlipidemia, Vitamin D Deficiency, hx of headaches/migraine, hx of transverse myelitis    Patient Stated Goals improve pain    Currently in Pain? --   0/10 at rest   Pain Onset More than a month ago             Ultrasound x 22mn to L lateral forearm 349m, 1.0 wts/cm2, continuous with no adverse reactions for pain.  Soft tissue  massage to L lateral forearm (wrist/finger extensors)  Checked fit of pt's L counterforce band and instructed pt in placement/use.  Cautioned pt against fighting against band/splint.   Discussed LUE positioning during sleep.  Recommended pt wear wrist splint and try lying on R side with LUE supported on pillow.  Discussed option of trying elbow ext brace for night wear prn due to reports of pain at night.  Reviewed Lateral Epicondylitis AROM exercises #1 and 2 (no pain reported).        OT Education - 09/23/20 1237    Education Details Added tricep stretch to HEP and Reviewed activity modifications (Lateral Epicondylitis handdout issued).  Added AROM Exercises #3+4 to HEP (with elbow extended, wrist flex with forearm in neutral and then with forearm in pronation)    Person(s) Educated Patient    Methods Explanation;Demonstration;Verbal cues;Handout    Comprehension Verbalized understanding;Returned demonstration            OT Short Term Goals - 09/16/20 1925      OT SHORT TERM GOAL #1   Title Pt will be independent with stretching HEP.--check STGs 10/16/20    Time 4    Period Weeks    Status New      OT SHORT TERM GOAL #2  Title Pt will be independent with activity modifications/improved positioning for ADLs and IADLs to decr pain.    Time 4    Period Weeks    Status New      OT SHORT TERM GOAL #3   Title Pt will be indepedent with splint and counterforce band wear/care.    Time 4    Period Weeks    Status New      OT SHORT TERM GOAL #4   Title Pt will report pain consistently less than or equal to 3/10 for light ADLs/IADLs.    Time 4    Period Weeks    Status New             OT Long Term Goals - 09/16/20 1927      OT LONG TERM GOAL #1   Title Pt will be indepedent with strengthening HEP.    Time 8    Period Weeks    Status New      OT LONG TERM GOAL #2   Title Pt will be able to lift dishes with LUE without pain.    Time 8    Period Weeks    Status  New      OT LONG TERM GOAL #3   Title Pt will be able to perform supination with elbow extended to assist with patient evalutions without pain.    Time 8    Period Weeks    Status New      OT LONG TERM GOAL #4   Title Pt will report pain consistently less than or equal to 1/10 for typical ADLs/IADLs including sleep.    Time 8    Period Weeks    Status New      OT LONG TERM GOAL #5   Title Assess L grip strength and establish goal prn.    Time 8    Period Weeks    Status New                 Plan - 09/23/20 1240    Clinical Impression Statement Pt is tolerating HEP well.  Pt continues to report pain with functional tasks, but may have noticed mild improvement with splint wear at work.    OT Occupational Profile and History Problem Focused Assessment - Including review of records relating to presenting problem    Occupational performance deficits (Please refer to evaluation for details): ADL's;IADL's;Work;Leisure;Rest and Sleep    Body Structure / Function / Physical Skills ADL;Strength;UE functional use;Decreased knowledge of use of DME;Decreased knowledge of precautions;Pain;Body mechanics;IADL    Rehab Potential Good    Clinical Decision Making Limited treatment options, no task modification necessary    Comorbidities Affecting Occupational Performance: None    Modification or Assistance to Complete Evaluation  No modification of tasks or assist necessary to complete eval    OT Frequency 2x / week    OT Duration 8 weeks   +eval; however, may be able to decr frequency depending on progress   OT Treatment/Interventions Self-care/ADL training;Moist Heat;Fluidtherapy;DME and/or AE instruction;Splinting;Therapeutic activities;Contrast Bath;Ultrasound;Therapeutic exercise;Passive range of motion;Iontophoresis;Cryotherapy;Electrical Stimulation;Paraffin;Manual Therapy;Patient/family education    Plan ultrasound vs.  Ionto if order received, continue with activity modification  education prn, progress to PROM stretches if appropriate    Consulted and Agree with Plan of Care Patient           Patient will benefit from skilled therapeutic intervention in order to improve the following deficits and impairments:   Body Structure / Function /  Physical Skills: ADL, Strength, UE functional use, Decreased knowledge of use of DME, Decreased knowledge of precautions, Pain, Body mechanics, IADL       Visit Diagnosis: Pain in left elbow  Muscle weakness (generalized)    Problem List Patient Active Problem List   Diagnosis Date Noted  . Vitamin D deficiency 07/14/2018  . Prediabetes 07/14/2018  . NAFLD (nonalcoholic fatty liver disease) 07/14/2018  . Multiple sclerosis ([place]) 02/24/2013  . Erythema nodosum 02/13/2013  . Acute transverse myelitis ([place]) 02/13/2013    [facility] 09/23/2020, 2:27 PM  [place] [address] [place], [place], [address] Phone: [phone]   Fax:  [phone]  Name: Heather Moreno MRN: [id number] Date of Birth: December 08, 1970   Vianne Bulls, OTR/L [facility] [address] [place], [place]  [address] [phone] phone [phone] 09/23/20 2:27 PM

## 2020-09-24 ENCOUNTER — Other Ambulatory Visit (INDEPENDENT_AMBULATORY_CARE_PROVIDER_SITE_OTHER): Payer: Self-pay | Admitting: Family Medicine

## 2020-09-24 DIAGNOSIS — E559 Vitamin D deficiency, unspecified: Secondary | ICD-10-CM

## 2020-09-24 NOTE — Telephone Encounter (Signed)
Dr Beasley pt 

## 2020-09-25 ENCOUNTER — Encounter (INDEPENDENT_AMBULATORY_CARE_PROVIDER_SITE_OTHER): Payer: Self-pay

## 2020-09-25 NOTE — Telephone Encounter (Signed)
MyChart message sent to pt to find out if they have enough medication to get them through until next appt.   

## 2020-09-30 ENCOUNTER — Telehealth (INDEPENDENT_AMBULATORY_CARE_PROVIDER_SITE_OTHER): Payer: Self-pay

## 2020-09-30 ENCOUNTER — Ambulatory Visit (INDEPENDENT_AMBULATORY_CARE_PROVIDER_SITE_OTHER): Payer: 59 | Admitting: Family Medicine

## 2020-09-30 ENCOUNTER — Other Ambulatory Visit: Payer: Self-pay

## 2020-09-30 ENCOUNTER — Encounter (INDEPENDENT_AMBULATORY_CARE_PROVIDER_SITE_OTHER): Payer: Self-pay | Admitting: Family Medicine

## 2020-09-30 VITALS — BP 123/70 | HR 79 | Temp 98.1°F | Ht 62.0 in | Wt 157.0 lb

## 2020-09-30 DIAGNOSIS — M25522 Pain in left elbow: Secondary | ICD-10-CM | POA: Diagnosis not present

## 2020-09-30 DIAGNOSIS — G43809 Other migraine, not intractable, without status migrainosus: Secondary | ICD-10-CM

## 2020-09-30 DIAGNOSIS — R7303 Prediabetes: Secondary | ICD-10-CM | POA: Diagnosis not present

## 2020-09-30 DIAGNOSIS — Z9189 Other specified personal risk factors, not elsewhere classified: Secondary | ICD-10-CM | POA: Diagnosis not present

## 2020-09-30 DIAGNOSIS — E559 Vitamin D deficiency, unspecified: Secondary | ICD-10-CM | POA: Diagnosis not present

## 2020-09-30 DIAGNOSIS — E669 Obesity, unspecified: Secondary | ICD-10-CM

## 2020-09-30 DIAGNOSIS — Z6830 Body mass index (BMI) 30.0-30.9, adult: Secondary | ICD-10-CM

## 2020-09-30 MED ORDER — VITAMIN D (ERGOCALCIFEROL) 1.25 MG (50000 UNIT) PO CAPS: 50000.0000 [IU] | ORAL_CAPSULE | ORAL | 0 refills | Status: DC

## 2020-09-30 MED ORDER — METFORMIN HCL 500 MG PO TABS: ORAL_TABLET | ORAL | 0 refills | Status: DC

## 2020-09-30 MED ORDER — NURTEC 75 MG PO TBDP: 75.0000 mg | ORAL_TABLET | ORAL | 1 refills | Status: DC

## 2020-09-30 NOTE — Telephone Encounter (Signed)
PA initiated via [email or web address] for Nurtec 75mg  QOD.   (Key: [id number]) Rx #[name] Nurtec 75MG  dispersible tablets   Form: : [id number] PA Form (2017 NCPDP) Created  Sent to Plan  Plan Response: Submit Clinical Questions less than a minute ago  Determination: Wait for Determination  Please wait for [employer] NCPDP 2017 to return a determination.

## 2020-10-01 ENCOUNTER — Encounter: Payer: Self-pay | Admitting: Occupational Therapy

## 2020-10-01 ENCOUNTER — Ambulatory Visit: Payer: 59 | Attending: Family Medicine | Admitting: Occupational Therapy

## 2020-10-01 DIAGNOSIS — M25522 Pain in left elbow: Secondary | ICD-10-CM | POA: Insufficient documentation

## 2020-10-01 DIAGNOSIS — M6281 Muscle weakness (generalized): Secondary | ICD-10-CM | POA: Insufficient documentation

## 2020-10-01 NOTE — Progress Notes (Signed)
Chief Complaint:   OBESITY Heather Moreno is here to discuss her progress with her obesity treatment plan along with follow-up of her obesity related diagnoses. Heather Moreno is on keeping a food journal and adhering to recommended goals of 1400 calories and 90+ grams of protein daily and states she is following her eating plan approximately 60% of the time. Heather Moreno states she is walking for 30 minutes 3 times per week.  Today's visit was #: 15 Starting weight: 161 lbs Starting date: 06/30/2018 Today's weight: 157 lbs Today's date: 09/30/2020 Total lbs lost to date: 4 Total lbs lost since last in-office visit: 1  Interim History: Heather Moreno continues to do well with weight loss. She is working on meeting her protein goals but struggles with exercise due to schedule and elbow pain.  Subjective:   1. Left elbow pain Sindi started occupational therapy but no improvement in pain yet. She would like to have iontophoresis.  2. 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 She has taken Nurtec samples at her office, and she requests a script.  3. Pre-diabetes Heather Moreno is stable on metformin and diet, and she needs a refill today.  4. Vitamin D deficiency Heather Moreno is stable on Vit D, and she needs a refill today.  5. At risk for diabetes mellitus Heather Moreno is at higher than average risk for developing diabetes due to her obesity.   Assessment/Plan:   1. Left elbow pain We will refer to occupational therapy with Willa Frater with iontophoresis with topical steroids.   - Ambulatory referral to Occupational Therapy  2. Other migraine without status migrainosus, not intractable Kenadi agreed to start Nurtec 75 mg every other day with 1 refill.  - Rimegepant Sulfate (NURTEC) 75 MG TBDP; Take 75 mg by mouth every other day.  Dispense: 16 tablet; Refill: 1  3. Pre-diabetes Uilani will continue to work on weight loss, exercise, and decreasing simple  carbohydrates to help decrease the risk of diabetes. We will refill metformin for 1 month.  - metFORMIN (GLUCOPHAGE) 500 MG tablet; Take one tablet by mouth twice a day  Dispense: 60 tablet; Refill: 0  4. Vitamin D deficiency Low Vitamin D level contributes to fatigue and are associated with obesity, breast, and colon cancer. We will refill prescription Vitamin D for 1 month. Judythe will follow-up for routine testing of Vitamin D, at least 2-3 times per year to avoid over-replacement.  - Vitamin D, Ergocalciferol, (DRISDOL) 1.25 MG (50000 UNIT) CAPS capsule; Take 1 capsule (50,000 Units total) by mouth every 7 (seven) days.  Dispense: 4 capsule; Refill: 0  5. At risk for diabetes mellitus Heather Moreno was given approximately 15 minutes of diabetes education and counseling today. We discussed intensive lifestyle modifications today with an emphasis on weight loss as well as increasing exercise and decreasing simple carbohydrates in her diet. We also reviewed medication options with an emphasis on risk versus benefit of those discussed.   Repetitive spaced learning was employed today to elicit superior memory formation and behavioral change.  6. Class 1 obesity with serious comorbidity and body mass index (BMI) of 30.0 to 30.9 in adult, unspecified obesity type Heather Moreno is currently in the action stage of change. As such, her goal is to continue with weight loss efforts. She has agreed to keeping a food journal and adhering to recommended goals of 1500 calories and 90 grams of protein daily.   Exercise goals: As is.  Behavioral modification strategies:  meal planning and cooking strategies.  Heather Moreno has agreed to follow-up with our clinic in 4 weeks. She was informed of the importance of frequent follow-up visits to maximize her success with intensive lifestyle modifications for her multiple health conditions.   Objective:   Blood pressure 123/70, pulse 79, temperature 98.1 F (36.7 C), height 5\' 2"  (1.575  m), weight 157 lb (71.2 kg), SpO2 97 %. Body mass index is 28.72 kg/m.  General: Cooperative, alert, well developed, in no acute distress. HEENT: Conjunctivae and lids unremarkable. Cardiovascular: Regular rhythm.  Lungs: Normal work of breathing. Neurologic: No focal deficits.   Lab Results  Component Value Date   CREATININE 0.88 08/21/2020   BUN 16 08/21/2020   NA 140 08/21/2020   K 5.0 08/21/2020   CL 102 08/21/2020   CO2 24 08/21/2020   Lab Results  Component Value Date   ALT 25 08/21/2020   AST 17 08/21/2020   ALKPHOS 103 08/21/2020   BILITOT 0.2 08/21/2020   Lab Results  Component Value Date   HGBA1C 6.0 (H) 08/21/2020   HGBA1C 6.2 (H) 05/22/2020   HGBA1C 6.2 (H) 01/24/2020   HGBA1C 6.0 (H) 11/07/2018   HGBA1C 6.3 (H) 06/30/2018   Lab Results  Component Value Date   INSULIN 23.0 08/21/2020   INSULIN 27.0 (H) 05/22/2020   INSULIN 28.5 (H) 01/24/2020   INSULIN 16.1 11/07/2018   INSULIN 25.3 (H) 06/30/2018   Lab Results  Component Value Date   TSH 2.460 01/24/2020   Lab Results  Component Value Date   CHOL 253 (H) 08/21/2020   HDL 76 08/21/2020   LDLCALC 160 (H) 08/21/2020   TRIG 98 08/21/2020   Lab Results  Component Value Date   WBC 7.2 08/21/2020   HGB 12.6 08/21/2020   HCT 39.1 08/21/2020   MCV 89 08/21/2020   PLT 375 08/21/2020   No results found for: IRON, TIBC, FERRITIN  Attestation Statements:   Reviewed by clinician on day of visit: allergies, medications, problem list, medical history, surgical history, family history, social history, and previous encounter notes.   I, 08/23/2020, am acting as transcriptionist for Burt Knack, MD.  I have reviewed the above documentation for accuracy and completeness, and I agree with the above. -  Charlean Sanfilippo, MD

## 2020-10-01 NOTE — Therapy (Signed)
[facility] [address] [place], [place], [address] Phone: [phone]   Fax:  [phone]  Occupational Therapy Treatment  Patient Details  Name: Heather Moreno MRN: [id number] Date of Birth: 1971/08/03 Referring Provider (OT): Dr. Dennard Nip   Encounter Date: 10/01/2020   OT End of Session - 10/01/20 1505    Visit Number 3    Number of Visits 17    Date for OT Re-Evaluation 11/15/20    Authorization Type [employer] 60 visit limit combined OT/PT, covered 100% after deductible met    OT Start Time 0804    OT Stop Time 0845    OT Time Calculation (min) 41 min    Activity Tolerance Patient tolerated treatment well    Behavior During Therapy [facility] for tasks assessed/performed           Past Medical History:  Diagnosis Date  . Allergic rhinitis   . Complication of anesthesia    difficulty with weakness after spinal  . Environmental allergies   . GERD (gastroesophageal reflux disease)   . Headache(784.0)   . HLD (hyperlipidemia)   . IBS (irritable bowel syndrome)   . Lactose intolerance   . Migraine headache with aura   . Vitamin D deficiency     Past Surgical History:  Procedure Laterality Date  . cesarean sections  2009, 2003  . NASAL SINUS SURGERY Bilateral 1989    There were no vitals filed for this visit.   Subjective Assessment - 10/01/20 1502    Subjective  Pt reports difficulty putting on glasses and stethoscope.  "It felt good after heat and massage"  Pt reports implementing recommendations, but did not wear splint yesterday.    Pertinent History L tennis elbow/lateral elbow pain.        PMH:  hyperlipidemia, Vitamin D Deficiency, hx of headaches/migraine, hx of transverse myelitis    Patient Stated Goals improve pain    Currently in Pain? Yes    Pain Score 6     Pain Location Elbow    Pain Orientation Left    Pain Descriptors / Indicators Sharp    Pain Type Acute pain    Pain Onset More than a  month ago    Pain Frequency Intermittent    Aggravating Factors  elbow flex with wrist flex/ext, extending middle finger    Pain Relieving Factors rest               Ultrasound x 12mn to L lateral forearm 35m, 1.0 wts/cm2, continuous with no adverse reactions for pain.  Soft tissue massage to L lateral forearm (wrist/finger extensors)  Pt reports that she is having overall difficulty sleeping at night for multiple reasons and that she cannot keep elbow in suggested position due to movement while asleep.  Pt is wearing wrist brace, but was unable to tolerate neoprene sleeve.   Continued education to avoid wrist movements with elbow movements during functional tasks as able and to continue to wear wrist splint.  Cautioned pt against fighting against band/splint.  Reviewed Lateral Epicondylitis AROM exercises #1-4 and instructed pt to perform only in pain free range (stetch is goal).  Pt returned demo        OT Education - 10/01/20 1504    Education Details Additions to HEP--see pt instructions.  Recommended d/c tricep stretch for now.  Continue with AROM Exercises #1-4 with stretch only (no pain) range    Person(s) Educated Patient    Methods Explanation;Demonstration;Handout;Verbal cues  Comprehension Verbalized understanding;Returned demonstration            OT Short Term Goals - 09/16/20 1925      OT SHORT TERM GOAL #1   Title Pt will be independent with stretching HEP.--check STGs 10/16/20    Time 4    Period Weeks    Status New      OT SHORT TERM GOAL #2   Title Pt will be independent with activity modifications/improved positioning for ADLs and IADLs to decr pain.    Time 4    Period Weeks    Status New      OT SHORT TERM GOAL #3   Title Pt will be indepedent with splint and counterforce band wear/care.    Time 4    Period Weeks    Status New      OT SHORT TERM GOAL #4   Title Pt will report pain consistently less than or equal to 3/10 for light  ADLs/IADLs.    Time 4    Period Weeks    Status New             OT Long Term Goals - 09/16/20 1927      OT LONG TERM GOAL #1   Title Pt will be indepedent with strengthening HEP.    Time 8    Period Weeks    Status New      OT LONG TERM GOAL #2   Title Pt will be able to lift dishes with LUE without pain.    Time 8    Period Weeks    Status New      OT LONG TERM GOAL #3   Title Pt will be able to perform supination with elbow extended to assist with patient evalutions without pain.    Time 8    Period Weeks    Status New      OT LONG TERM GOAL #4   Title Pt will report pain consistently less than or equal to 1/10 for typical ADLs/IADLs including sleep.    Time 8    Period Weeks    Status New      OT LONG TERM GOAL #5   Title Assess L grip strength and establish goal prn.    Time 8    Period Weeks    Status New                 Plan - 10/01/20 1505    Clinical Impression Statement Pt continues to report pain with elbow flex with wrist movements.  Pt reports improvement after massage/heat.    OT Occupational Profile and History Problem Focused Assessment - Including review of records relating to presenting problem    Occupational performance deficits (Please refer to evaluation for details): ADL's;IADL's;Work;Leisure;Rest and Sleep    Body Structure / Function / Physical Skills ADL;Strength;UE functional use;Decreased knowledge of use of DME;Decreased knowledge of precautions;Pain;Body mechanics;IADL    Rehab Potential Good    Clinical Decision Making Limited treatment options, no task modification necessary    Comorbidities Affecting Occupational Performance: None    Modification or Assistance to Complete Evaluation  No modification of tasks or assist necessary to complete eval    OT Frequency 2x / week    OT Duration 8 weeks   +eval; however, may be able to decr frequency depending on progress   OT Treatment/Interventions Self-care/ADL training;Moist  Heat;Fluidtherapy;DME and/or AE instruction;Splinting;Therapeutic activities;Contrast Bath;Ultrasound;Therapeutic exercise;Passive range of motion;Iontophoresis;Cryotherapy;Electrical Stimulation;Paraffin;Manual Therapy;Patient/family education    Plan  ultrasound vs. Ionto if order received (sent 2nd request), continue with activity modification education prn, progress to PROM stretches if appropriate    Consulted and Agree with Plan of Care Patient           Patient will benefit from skilled therapeutic intervention in order to improve the following deficits and impairments:   Body Structure / Function / Physical Skills: ADL, Strength, UE functional use, Decreased knowledge of use of DME, Decreased knowledge of precautions, Pain, Body mechanics, IADL       Visit Diagnosis: Pain in left elbow  Muscle weakness (generalized)    Problem List Patient Active Problem List   Diagnosis Date Noted  . Vitamin D deficiency 07/14/2018  . Prediabetes 07/14/2018  . NAFLD (nonalcoholic fatty liver disease) 07/14/2018  . Multiple sclerosis ([place]) 02/24/2013  . Erythema nodosum 02/13/2013  . Acute transverse myelitis ([place]) 02/13/2013    [facility] 10/01/2020, 3:11 PM  [place] [facility] [address] [place], [place], [address] Phone: [phone]   Fax:  [phone]  Name: Heather Moreno MRN: [id number] Date of Birth: 06-06-1971   Vianne Bulls, OTR/L [facility] [address] [place], [place]  [address] [phone] phone [phone] 10/01/20 3:11 PM

## 2020-10-01 NOTE — Patient Instructions (Signed)
° °  Upper Extremity: [employer] down.  Hold a ball/shoe box/pillow (shoulder width sized)  with arms straight and hands flat on either side. Start with ball on chest, then straighten elbows to push ball up to the ceiling trying to keep elbows next to body hold 3sec, then slowly lower by keeping elbows close to your side. Keep shoulders away from ears Repeat 10 times.  Rest, then do 10 more, 2x/day   Overhead Arm Extension - Supine     Lay down.  Hold a ball/shoe box/pillow (shoulder width sized) on tops of legs with arms straight and hands flat on either side. Slowly move arms up/back in an arc with elbows straight and then slowly lower back down. Keep shoulders away from ears. Repeat 10 times.    Rest, then do 10 more, 2x/day      Then start with ball to ceiling and bend elbows to bring ball behind head.  10x, 2x/day

## 2020-10-02 ENCOUNTER — Encounter (INDEPENDENT_AMBULATORY_CARE_PROVIDER_SITE_OTHER): Payer: Self-pay | Admitting: Family Medicine

## 2020-10-02 ENCOUNTER — Other Ambulatory Visit (INDEPENDENT_AMBULATORY_CARE_PROVIDER_SITE_OTHER): Payer: Self-pay

## 2020-10-02 ENCOUNTER — Encounter (INDEPENDENT_AMBULATORY_CARE_PROVIDER_SITE_OTHER): Payer: Self-pay

## 2020-10-02 NOTE — Telephone Encounter (Signed)
PA for [employer] has been approved.  Approval is good from 09/30/20 through 09/30/2021. Pt notified via mychart.

## 2020-10-02 NOTE — Progress Notes (Signed)
error 

## 2020-10-03 ENCOUNTER — Encounter: Payer: Self-pay | Admitting: Occupational Therapy

## 2020-10-03 ENCOUNTER — Other Ambulatory Visit: Payer: Self-pay

## 2020-10-03 ENCOUNTER — Ambulatory Visit: Payer: 59 | Admitting: Occupational Therapy

## 2020-10-03 DIAGNOSIS — M25522 Pain in left elbow: Secondary | ICD-10-CM

## 2020-10-03 DIAGNOSIS — M6281 Muscle weakness (generalized): Secondary | ICD-10-CM

## 2020-10-03 NOTE — Telephone Encounter (Signed)
Dr Beasley pt 

## 2020-10-03 NOTE — Therapy (Signed)
[facility] [address] [place], [place], [address] Phone: [phone]   Fax:  [phone]  Occupational Therapy Treatment  Patient Details  Name: Heather Moreno MRN: [id number] Date of Birth: [birth year] Referring Provider (OT): Dr. Dennard Nip   Encounter Date: 10/03/2020   OT End of Session - 10/03/20 1527    Visit Number 4    Number of Visits 17    Date for OT Re-Evaluation 11/15/20    Authorization Type [employer] 60 visit limit combined OT/PT, covered 100% after deductible met    OT Start Time 0804    OT Stop Time 0850    OT Time Calculation (min) 46 min    Activity Tolerance Patient tolerated treatment well    Behavior During Therapy [facility] for tasks assessed/performed           Past Medical History:  Diagnosis Date  . Allergic rhinitis   . Complication of anesthesia    difficulty with weakness after spinal  . Environmental allergies   . GERD (gastroesophageal reflux disease)   . Headache(784.0)   . HLD (hyperlipidemia)   . IBS (irritable bowel syndrome)   . Lactose intolerance   . Migraine headache with aura   . Vitamin D deficiency     Past Surgical History:  Procedure Laterality Date  . cesarean sections  2009, 2003  . NASAL SINUS SURGERY Bilateral 1989    There were no vitals filed for this visit.   Subjective Assessment - 10/03/20 1525    Subjective  Pain is about the same.  I'm trying to be more aware of my movement.    Pertinent History L tennis elbow/lateral elbow pain.        PMH:  hyperlipidemia, Vitamin D Deficiency, hx of headaches/migraine, hx of transverse myelitis    Patient Stated Goals improve pain    Currently in Pain? Yes    Pain Score 6     Pain Location Elbow    Pain Orientation Left    Pain Descriptors / Indicators Sharp    Pain Type Acute pain    Pain Onset More than a month ago    Pain Frequency Intermittent    Aggravating Factors  elbow flex with wrist movement,  extending middle finger, elbow ext after prolonged flex    Pain Relieving Factors rest                 Ultrasound x 6mn to L lateral/dorsal forearm 366m, 1.0 wts/cm2, continuous with no adverse reactions for pain.  Soft tissue massage to L lateral/dorsal forearm (wrist/finger extensors)  Reviewed Lateral Epicondylitis AROM exercises #1-4 and instructed pt to perform only in pain free range (stetch is goal).  Pt returned demo x10 each.  Reviewed supine closed-chain shoulder flex, chest press, and tricep ext with ball with BUEs and pt returned demo each x10.   Pt instructed to avoid guarding positions/shoulder hike during functional tasks and frequently take breaks/stretch with using computer.         OT Education - 10/03/20 1527    Education Details Added PROM #1 stretch to HEP (elbow flex with forearm neutral and wrist flex) only within pain free range    Person(s) Educated Patient    Methods Explanation;Demonstration;Handout;Verbal cues    Comprehension Verbalized understanding;Returned demonstration            OT Short Term Goals - 09/16/20 1925      OT SHORT TERM GOAL #1   Title Pt will be  independent with stretching HEP.--check STGs 10/16/20    Time 4    Period Weeks    Status New      OT SHORT TERM GOAL #2   Title Pt will be independent with activity modifications/improved positioning for ADLs and IADLs to decr pain.    Time 4    Period Weeks    Status New      OT SHORT TERM GOAL #3   Title Pt will be indepedent with splint and counterforce band wear/care.    Time 4    Period Weeks    Status New      OT SHORT TERM GOAL #4   Title Pt will report pain consistently less than or equal to 3/10 for light ADLs/IADLs.    Time 4    Period Weeks    Status New             OT Long Term Goals - 09/16/20 1927      OT LONG TERM GOAL #1   Title Pt will be indepedent with strengthening HEP.    Time 8    Period Weeks    Status New      OT LONG TERM GOAL  #2   Title Pt will be able to lift dishes with LUE without pain.    Time 8    Period Weeks    Status New      OT LONG TERM GOAL #3   Title Pt will be able to perform supination with elbow extended to assist with patient evalutions without pain.    Time 8    Period Weeks    Status New      OT LONG TERM GOAL #4   Title Pt will report pain consistently less than or equal to 1/10 for typical ADLs/IADLs including sleep.    Time 8    Period Weeks    Status New      OT LONG TERM GOAL #5   Title Assess L grip strength and establish goal prn.    Time 8    Period Weeks    Status New                 Plan - 10/03/20 1528    Clinical Impression Statement Pt continues to report pain with elbow flex with wrist movements.  Pt reports improvement after massage/heat.    OT Occupational Profile and History Problem Focused Assessment - Including review of records relating to presenting problem    Occupational performance deficits (Please refer to evaluation for details): ADL's;IADL's;Work;Leisure;Rest and Sleep    Body Structure / Function / Physical Skills ADL;Strength;UE functional use;Decreased knowledge of use of DME;Decreased knowledge of precautions;Pain;Body mechanics;IADL    Rehab Potential Good    Clinical Decision Making Limited treatment options, no task modification necessary    Comorbidities Affecting Occupational Performance: None    Modification or Assistance to Complete Evaluation  No modification of tasks or assist necessary to complete eval    OT Frequency 2x / week    OT Duration 8 weeks   +eval; however, may be able to decr frequency depending on progress   OT Treatment/Interventions Self-care/ADL training;Moist Heat;Fluidtherapy;DME and/or AE instruction;Splinting;Therapeutic activities;Contrast Bath;Ultrasound;Therapeutic exercise;Passive range of motion;Iontophoresis;Cryotherapy;Electrical Stimulation;Paraffin;Manual Therapy;Patient/family education    Plan Ionto #1  (review Ionto precautions), continue with activity modification education prn, soft tissue massage, progress to with PROM stetches if appropriate    Consulted and Agree with Plan of Care Patient  Patient will benefit from skilled therapeutic intervention in order to improve the following deficits and impairments:   Body Structure / Function / Physical Skills: ADL, Strength, UE functional use, Decreased knowledge of use of DME, Decreased knowledge of precautions, Pain, Body mechanics, IADL       Visit Diagnosis: Pain in left elbow  Muscle weakness (generalized)    Problem List Patient Active Problem List   Diagnosis Date Noted  . Vitamin D deficiency 07/14/2018  . Prediabetes 07/14/2018  . NAFLD (nonalcoholic fatty liver disease) 07/14/2018  . Multiple sclerosis ([place]) 02/24/2013  . Erythema nodosum 02/13/2013  . Acute transverse myelitis ([place]) 02/13/2013    [facility] 10/03/2020, 3:32 PM  [place] [address] [place], [place], [address] Phone: [phone]   Fax:  [phone]  Name: Heather Moreno MRN: [id number] Date of Birth: 1971-11-30   Vianne Bulls, OTR/L [facility] [address] [place], [place]  [address] [phone] phone [phone] 10/03/20 3:32 PM

## 2020-10-07 ENCOUNTER — Other Ambulatory Visit: Payer: Self-pay

## 2020-10-07 ENCOUNTER — Encounter: Payer: Self-pay | Admitting: Occupational Therapy

## 2020-10-07 ENCOUNTER — Ambulatory Visit: Payer: 59 | Admitting: Occupational Therapy

## 2020-10-07 DIAGNOSIS — M25522 Pain in left elbow: Secondary | ICD-10-CM | POA: Diagnosis not present

## 2020-10-07 DIAGNOSIS — M6281 Muscle weakness (generalized): Secondary | ICD-10-CM

## 2020-10-07 NOTE — Therapy (Signed)
[facility] [address] [place], [place], [address] Phone: [phone]   Fax:  [phone]  Occupational Therapy Treatment  Patient Details  Name: Heather Moreno MRN: [id number] Date of Birth: 1971-08-11 Referring Provider (OT): Dr. Dennard Nip   Encounter Date: 10/07/2020   OT End of Session - 10/07/20 0801    Visit Number 5    Number of Visits 17    Date for OT Re-Evaluation 11/15/20    Authorization Type [employer] 60 visit limit combined OT/PT, covered 100% after deductible met    OT Start Time 0720    OT Stop Time 0804    OT Time Calculation (min) 44 min    Activity Tolerance Patient tolerated treatment well    Behavior During Therapy [facility] for tasks assessed/performed           Past Medical History:  Diagnosis Date  . Allergic rhinitis   . Complication of anesthesia    difficulty with weakness after spinal  . Environmental allergies   . GERD (gastroesophageal reflux disease)   . Headache(784.0)   . HLD (hyperlipidemia)   . IBS (irritable bowel syndrome)   . Lactose intolerance   . Migraine headache with aura   . Vitamin D deficiency     Past Surgical History:  Procedure Laterality Date  . cesarean sections  2009, 2003  . NASAL SINUS SURGERY Bilateral 1989    There were no vitals filed for this visit.   Subjective Assessment - 10/07/20 0748    Subjective  Pt reports that she feels like the pain is better (been taking Ibuprofen last couple days)    Pertinent History L tennis elbow/lateral elbow pain.        PMH:  hyperlipidemia, Vitamin D Deficiency, hx of headaches/migraine, hx of transverse myelitis    Patient Stated Goals improve pain    Currently in Pain? Yes    Pain Score 3     Pain Location Elbow    Pain Orientation Left    Pain Descriptors / Indicators Sharp    Pain Type Acute pain    Pain Onset More than a month ago    Pain Frequency Intermittent    Aggravating Factors  elbow flex with  wrist movement, extending middle finger, elbow ext after prolong flex    Pain Relieving Factors rest              Soft tissue massage to L lateral/dorsal forearm (wrist/finger extensors)  Reviewed Lateral Epicondylitis AROM exercises #1-4 and reviewed pt to perform only in pain free range (stetch is goal).  Pt returned demo x10 each.  Then PROM exercise #1 (passive wrist flex with forearm in neutral and elbow in flex).  Min cueing during exercises for L shoulder hike.  Pt reports only stretch today.   Iontophoresis x64mn to L lateral elbow using 4 mg/mL of dexamethasone, intensity=1.8-2.1 with no adverse reactions (very mild pink coloration noted under electrode site.         OT Education - 10/07/20 1304    Education Details Iontrophoresis precautions/contraindications    Person(s) Educated Patient    Methods Explanation;Handout    Comprehension Verbalized understanding            OT Short Term Goals - 09/16/20 1925      OT SHORT TERM GOAL #1   Title Pt will be independent with stretching HEP.--check STGs 10/16/20    Time 4    Period Weeks    Status New  OT SHORT TERM GOAL #2   Title Pt will be independent with activity modifications/improved positioning for ADLs and IADLs to decr pain.    Time 4    Period Weeks    Status New      OT SHORT TERM GOAL #3   Title Pt will be indepedent with splint and counterforce band wear/care.    Time 4    Period Weeks    Status New      OT SHORT TERM GOAL #4   Title Pt will report pain consistently less than or equal to 3/10 for light ADLs/IADLs.    Time 4    Period Weeks    Status New             OT Long Term Goals - 09/16/20 1927      OT LONG TERM GOAL #1   Title Pt will be indepedent with strengthening HEP.    Time 8    Period Weeks    Status New      OT LONG TERM GOAL #2   Title Pt will be able to lift dishes with LUE without pain.    Time 8    Period Weeks    Status New      OT LONG TERM GOAL #3    Title Pt will be able to perform supination with elbow extended to assist with patient evalutions without pain.    Time 8    Period Weeks    Status New      OT LONG TERM GOAL #4   Title Pt will report pain consistently less than or equal to 1/10 for typical ADLs/IADLs including sleep.    Time 8    Period Weeks    Status New      OT LONG TERM GOAL #5   Title Assess L grip strength and establish goal prn.    Time 8    Period Weeks    Status New                 Plan - 10/07/20 0803    Clinical Impression Statement Pt reports overall improved pain last few days.  Pt tolerated Iontophoresis treatment well.    OT Occupational Profile and History Problem Focused Assessment - Including review of records relating to presenting problem    Occupational performance deficits (Please refer to evaluation for details): ADL's;IADL's;Work;Leisure;Rest and Sleep    Body Structure / Function / Physical Skills ADL;Strength;UE functional use;Decreased knowledge of use of DME;Decreased knowledge of precautions;Pain;Body mechanics;IADL    Rehab Potential Good    Clinical Decision Making Limited treatment options, no task modification necessary    Comorbidities Affecting Occupational Performance: None    Modification or Assistance to Complete Evaluation  No modification of tasks or assist necessary to complete eval    OT Frequency 2x / week    OT Duration 8 weeks   +eval; however, may be able to decr frequency depending on progress   OT Treatment/Interventions Self-care/ADL training;Moist Heat;Fluidtherapy;DME and/or AE instruction;Splinting;Therapeutic activities;Contrast Bath;Ultrasound;Therapeutic exercise;Passive range of motion;Iontophoresis;Cryotherapy;Electrical Stimulation;Paraffin;Manual Therapy;Patient/family education    Plan Continue with Ionto, continue with activity modification education prn, soft tissue massage, progress to with PROM stetches if appropriate    Consulted and Agree with  Plan of Care Patient           Patient will benefit from skilled therapeutic intervention in order to improve the following deficits and impairments:   Body Structure / Function / Physical Skills: ADL, Strength,  UE functional use, Decreased knowledge of use of DME, Decreased knowledge of precautions, Pain, Body mechanics, IADL       Visit Diagnosis: Pain in left elbow  Muscle weakness (generalized)    Problem List Patient Active Problem List   Diagnosis Date Noted  . Vitamin D deficiency 07/14/2018  . Prediabetes 07/14/2018  . NAFLD (nonalcoholic fatty liver disease) 07/14/2018  . Multiple sclerosis ([place]) 02/24/2013  . Erythema nodosum 02/13/2013  . Acute transverse myelitis ([place]) 02/13/2013    [facility] 10/07/2020, 2:41 PM  [facility] [address] [place], [place], [address] Phone: [phone]   Fax:  [phone]  Name: Cerinity Zynda MRN: [id number] Date of Birth: 12-29-1970   Vianne Bulls, OTR/L [facility] [address] [place], [place]  [address] [phone] phone [phone] 10/07/20 2:41 PM

## 2020-10-09 ENCOUNTER — Other Ambulatory Visit: Payer: Self-pay

## 2020-10-09 ENCOUNTER — Ambulatory Visit: Payer: 59 | Admitting: Occupational Therapy

## 2020-10-09 DIAGNOSIS — M6281 Muscle weakness (generalized): Secondary | ICD-10-CM

## 2020-10-09 DIAGNOSIS — M25522 Pain in left elbow: Secondary | ICD-10-CM | POA: Diagnosis not present

## 2020-10-09 NOTE — Therapy (Signed)
[facility] [address] [place], [place], [address] Phone: [phone]   Fax:  [phone]  Occupational Therapy Treatment  Patient Details  Name: Heather Moreno MRN: [id number] Date of Birth: [birth year] Referring Provider (OT): Dr. Dennard Nip   Encounter Date: 10/09/2020   OT End of Session - 10/09/20 0726    Visit Number 6    Number of Visits 17    Date for OT Re-Evaluation 11/15/20    Authorization Type [employer] 60 visit limit combined OT/PT, covered 100% after deductible met    OT Start Time 0717    OT Stop Time 0800    OT Time Calculation (min) 43 min    Activity Tolerance Patient tolerated treatment well    Behavior During Therapy [facility] for tasks assessed/performed           Past Medical History:  Diagnosis Date  . Allergic rhinitis   . Complication of anesthesia    difficulty with weakness after spinal  . Environmental allergies   . GERD (gastroesophageal reflux disease)   . Headache(784.0)   . HLD (hyperlipidemia)   . IBS (irritable bowel syndrome)   . Lactose intolerance   . Migraine headache with aura   . Vitamin D deficiency     Past Surgical History:  Procedure Laterality Date  . cesarean sections  2009, 2003  . NASAL SINUS SURGERY Bilateral 1989    There were no vitals filed for this visit.   Subjective Assessment - 10/09/20 0724    Subjective  Pt reports continued pain    Pertinent History L tennis elbow/lateral elbow pain.        PMH:  hyperlipidemia, Vitamin D Deficiency, hx of headaches/migraine, hx of transverse myelitis    Patient Stated Goals improve pain    Currently in Pain? Yes    Pain Score 4     Pain Location Elbow    Pain Orientation Left    Pain Descriptors / Indicators Aching    Pain Type Acute pain    Pain Onset More than a month ago    Pain Frequency Intermittent    Aggravating Factors  overuse    Pain Relieving Factors rest, ibuprophen    Multiple Pain Sites No                     Treatment:Soft tissue mobs to left elbow and left forearm  (wrist/finger extensors)  Reviewed Lateral Epicondylitis AROM exercises #1-4 and instructed  pt to perform only in pain free range.  Pt returned demo x10 each.   Then PROM exercise #1 (passive wrist flex with forearm in neutral and elbow in flex).  Pt was instructed in positioning to provide support to arm to avoid shoulder hike prn..  Pt reports mild pain today therefore did not progress to additional passive exercises.   Iontophoresis x78mn to L lateral elbow using 4 mg/mL of dexamethasone, intensity=1.8 with no adverse reactions (very mild pink coloration noted under electrode site.)               OT Short Term Goals - 09/16/20 1925      OT SHORT TERM GOAL #1   Title Pt will be independent with stretching HEP.--check STGs 10/16/20    Time 4    Period Weeks    Status New      OT SHORT TERM GOAL #2   Title Pt will be independent with activity modifications/improved positioning for ADLs and IADLs to decr pain.  Time 4    Period Weeks    Status New      OT SHORT TERM GOAL #3   Title Pt will be indepedent with splint and counterforce band wear/care.    Time 4    Period Weeks    Status New      OT SHORT TERM GOAL #4   Title Pt will report pain consistently less than or equal to 3/10 for light ADLs/IADLs.    Time 4    Period Weeks    Status New             OT Long Term Goals - 09/16/20 1927      OT LONG TERM GOAL #1   Title Pt will be indepedent with strengthening HEP.    Time 8    Period Weeks    Status New      OT LONG TERM GOAL #2   Title Pt will be able to lift dishes with LUE without pain.    Time 8    Period Weeks    Status New      OT LONG TERM GOAL #3   Title Pt will be able to perform supination with elbow extended to assist with patient evalutions without pain.    Time 8    Period Weeks    Status New      OT LONG TERM GOAL #4   Title Pt will  report pain consistently less than or equal to 1/10 for typical ADLs/IADLs including sleep.    Time 8    Period Weeks    Status New      OT LONG TERM GOAL #5   Title Assess L grip strength and establish goal prn.    Time 8    Period Weeks    Status New                  Patient will benefit from skilled therapeutic intervention in order to improve the following deficits and impairments:           Visit Diagnosis: Pain in left elbow  Muscle weakness (generalized)    Problem List Patient Active Problem List   Diagnosis Date Noted  . Vitamin D deficiency 07/14/2018  . Prediabetes 07/14/2018  . NAFLD (nonalcoholic fatty liver disease) 07/14/2018  . Multiple sclerosis ([place]) 02/24/2013  . Erythema nodosum 02/13/2013  . Acute transverse myelitis ([place]) 02/13/2013    Heather Moreno 10/09/2020, 7:28 AM  [facility] [address] [place], [place], [address] Phone: [phone]   Fax:  [phone]  Name: Heather Moreno MRN: [id number] Date of Birth: 1971-06-06

## 2020-10-16 ENCOUNTER — Other Ambulatory Visit (INDEPENDENT_AMBULATORY_CARE_PROVIDER_SITE_OTHER): Payer: Self-pay | Admitting: Family Medicine

## 2020-10-16 ENCOUNTER — Encounter (INDEPENDENT_AMBULATORY_CARE_PROVIDER_SITE_OTHER): Payer: Self-pay

## 2020-10-16 ENCOUNTER — Other Ambulatory Visit: Payer: Self-pay

## 2020-10-16 ENCOUNTER — Ambulatory Visit: Payer: 59 | Admitting: Occupational Therapy

## 2020-10-16 DIAGNOSIS — R7303 Prediabetes: Secondary | ICD-10-CM

## 2020-10-16 DIAGNOSIS — M6281 Muscle weakness (generalized): Secondary | ICD-10-CM

## 2020-10-16 DIAGNOSIS — M25522 Pain in left elbow: Secondary | ICD-10-CM | POA: Diagnosis not present

## 2020-10-16 NOTE — Therapy (Signed)
[facility] [address] [place], [place], [address] Phone: [phone]   Fax:  [phone]  Occupational Therapy Treatment  Patient Details  Name: Heather Moreno MRN: [id number] Date of Birth: [birth year] Referring Provider (OT): Dr. Dennard Nip   Encounter Date: 10/16/2020   OT End of Session - 10/16/20 0758    Visit Number 7    Number of Visits 17    Date for OT Re-Evaluation 11/15/20    Authorization Type [employer] 60 visit limit combined OT/PT, covered 100% after deductible met    OT Start Time 0717    OT Stop Time 0805    OT Time Calculation (min) 48 min    Activity Tolerance Patient tolerated treatment well    Behavior During Therapy [facility] for tasks assessed/performed           Past Medical History:  Diagnosis Date  . Allergic rhinitis   . Complication of anesthesia    difficulty with weakness after spinal  . Environmental allergies   . GERD (gastroesophageal reflux disease)   . Headache(784.0)   . HLD (hyperlipidemia)   . IBS (irritable bowel syndrome)   . Lactose intolerance   . Migraine headache with aura   . Vitamin D deficiency     Past Surgical History:  Procedure Laterality Date  . cesarean sections  2009, 2003  . NASAL SINUS SURGERY Bilateral 1989    There were no vitals filed for this visit.   Subjective Assessment - 10/16/20 0730    Subjective  Pt reports continued pain that is worse when she does not take her ibuprophen    Pertinent History L tennis elbow/lateral elbow pain.        PMH:  hyperlipidemia, Vitamin D Deficiency, hx of headaches/migraine, hx of transverse myelitis    Patient Stated Goals improve pain    Currently in Pain? Yes    Pain Score 4     Pain Location Elbow    Pain Orientation Left    Pain Descriptors / Indicators Aching    Pain Type Acute pain    Pain Onset More than a month ago    Pain Frequency Intermittent    Aggravating Factors  overuse    Pain Relieving  Factors rest, ibuprophen                   TreatmentTreatment:Soft tissue mobs to left elbow and left forearm  (wrist/finger extensors)  Reviewed Lateral Epicondylitis AROM exercises #1-4 and instructed  pt to perform only in pain free range.  Pt returned demo x10 each.   Then PROM exercise #1-4   Pt was instructed in positioning to provide support to arm to avoid shoulder hike prn..  Pt reports stretching with updated exercises however exercises did not increase pain, primarily stretch noted by pt.  Iontophoresis x66mn to L lateral elbow using 4 mg/mL of dexamethasone, intensity=1.8 with no adverse reactions (very mild pink coloration noted under electrode site.)              OT Education - 10/16/20 0801    Education Details reviewed phase II exercises, and progressed to phase 3 stretching exercises, pt performed 10 reps each, discussion with pt regarding importance of proper positioning and that she may want to wear brace at home if she is perfroming alot of activity that may aggravate her elbow.    Person(s) Educated Patient    Methods Explanation   pt already has handout   Comprehension Verbalized understanding;Returned  demonstration;Verbal cues required            OT Short Term Goals - 09/16/20 1925      OT SHORT TERM GOAL #1   Title Pt will be independent with stretching HEP.--check STGs 10/16/20    Time 4    Period Weeks    Status New      OT SHORT TERM GOAL #2   Title Pt will be independent with activity modifications/improved positioning for ADLs and IADLs to decr pain.    Time 4    Period Weeks    Status New      OT SHORT TERM GOAL #3   Title Pt will be indepedent with splint and counterforce band wear/care.    Time 4    Period Weeks    Status New      OT SHORT TERM GOAL #4   Title Pt will report pain consistently less than or equal to 3/10 for light ADLs/IADLs.    Time 4    Period Weeks    Status New             OT Long Term Goals -  09/16/20 1927      OT LONG TERM GOAL #1   Title Pt will be indepedent with strengthening HEP.    Time 8    Period Weeks    Status New      OT LONG TERM GOAL #2   Title Pt will be able to lift dishes with LUE without pain.    Time 8    Period Weeks    Status New      OT LONG TERM GOAL #3   Title Pt will be able to perform supination with elbow extended to assist with patient evalutions without pain.    Time 8    Period Weeks    Status New      OT LONG TERM GOAL #4   Title Pt will report pain consistently less than or equal to 1/10 for typical ADLs/IADLs including sleep.    Time 8    Period Weeks    Status New      OT LONG TERM GOAL #5   Title Assess L grip strength and establish goal prn.    Time 8    Period Weeks    Status New                 Plan - 10/16/20 0759    Clinical Impression Statement Pt is progressing towards goals however she reports being limited by elbow pain. Pt progressed to completing all phase 3 exercises passive stretching today.    OT Occupational Profile and History Problem Focused Assessment - Including review of records relating to presenting problem    Occupational performance deficits (Please refer to evaluation for details): ADL's;IADL's;Work;Leisure;Rest and Sleep    Body Structure / Function / Physical Skills ADL;Strength;UE functional use;Decreased knowledge of use of DME;Decreased knowledge of precautions;Pain;Body mechanics;IADL    Rehab Potential Good    Clinical Decision Making Limited treatment options, no task modification necessary    Comorbidities Affecting Occupational Performance: None    Modification or Assistance to Complete Evaluation  No modification of tasks or assist necessary to complete eval    OT Frequency 2x / week    OT Duration 8 weeks   +eval; however, may be able to decr frequency depending on progress   OT Treatment/Interventions Self-care/ADL training;Moist Heat;Fluidtherapy;DME and/or AE  instruction;Splinting;Therapeutic activities;Contrast Bath;Ultrasound;Therapeutic exercise;Passive range of motion;Iontophoresis;Cryotherapy;Electrical Stimulation;Paraffin;Manual  Therapy;Patient/family education    Plan Continue with Ionto, continue with activity modification education prn, soft tissue massage, review passive stretching exercises.    Consulted and Agree with Plan of Care Patient           Patient will benefit from skilled therapeutic intervention in order to improve the following deficits and impairments:   Body Structure / Function / Physical Skills: ADL, Strength, UE functional use, Decreased knowledge of use of DME, Decreased knowledge of precautions, Pain, Body mechanics, IADL       Visit Diagnosis: Pain in left elbow  Muscle weakness (generalized)    Problem List Patient Active Problem List   Diagnosis Date Noted  . Vitamin D deficiency 07/14/2018  . Prediabetes 07/14/2018  . NAFLD (nonalcoholic fatty liver disease) 07/14/2018  . Multiple sclerosis ([place]) 02/24/2013  . Erythema nodosum 02/13/2013  . Acute transverse myelitis ([place]) 02/13/2013    Heather Moreno 10/16/2020, 8:05 AM  [facility] [address] [place], [place], [address] Phone: [phone]   Fax:  [phone]  Name: Heather Moreno MRN: [id number] Date of Birth: 11/30/1971

## 2020-10-16 NOTE — Telephone Encounter (Signed)
MyChart message sent to pt to find out if they have enough medication to get them through until next appt.   

## 2020-10-18 ENCOUNTER — Other Ambulatory Visit: Payer: Self-pay

## 2020-10-18 ENCOUNTER — Ambulatory Visit: Payer: 59 | Admitting: Occupational Therapy

## 2020-10-18 ENCOUNTER — Encounter: Payer: Self-pay | Admitting: Occupational Therapy

## 2020-10-18 DIAGNOSIS — M6281 Muscle weakness (generalized): Secondary | ICD-10-CM

## 2020-10-18 DIAGNOSIS — M25522 Pain in left elbow: Secondary | ICD-10-CM

## 2020-10-18 NOTE — Therapy (Addendum)
[facility] [address] [place], [place], [address] Phone: [phone]   Fax:  [phone]  Occupational Therapy Treatment  Patient Details  Name: Heather Moreno MRN: [id number] Date of Birth: 05-23-1971 Referring Provider (OT): Dr. Dennard Nip   Encounter Date: 10/18/2020   OT End of Session - 10/18/20 0817    Visit Number 8    Number of Visits 17    Date for OT Re-Evaluation 11/15/20    Authorization Type [employer] 60 visit limit combined OT/PT, covered 100% after deductible met    OT Start Time 0718    OT Stop Time 0805    OT Time Calculation (min) 47 min    Activity Tolerance Patient tolerated treatment well    Behavior During Therapy [facility] for tasks assessed/performed           Past Medical History:  Diagnosis Date  . Allergic rhinitis   . Complication of anesthesia    difficulty with weakness after spinal  . Environmental allergies   . GERD (gastroesophageal reflux disease)   . Headache(784.0)   . HLD (hyperlipidemia)   . IBS (irritable bowel syndrome)   . Lactose intolerance   . Migraine headache with aura   . Vitamin D deficiency     Past Surgical History:  Procedure Laterality Date  . cesarean sections  2009, 2003  . NASAL SINUS SURGERY Bilateral 1989    There were no vitals filed for this visit.   Subjective Assessment - 10/18/20 0751    Subjective  Pt reports pain this morining    Pertinent History L tennis elbow/lateral elbow pain.        PMH:  hyperlipidemia, Vitamin D Deficiency, hx of headaches/migraine, hx of transverse myelitis    Patient Stated Goals improve pain    Currently in Pain? Yes    Pain Score 4     Pain Location Elbow    Pain Orientation Left    Pain Type Acute pain    Pain Onset More than a month ago    Pain Frequency Intermittent    Aggravating Factors  overuse    Pain Relieving Factors rest ibuprophen                Treatment:Soft tissue mobs to left elbow  and left forearm  (wrist/finger extensors) Reviewed Lateral Epicondylitis AROM exercises #1-4 and instructed  pt to perform only in pain free range.  Pt returned demo x10 each.   Then PROM exercise #1-4     Pt reports stretching with updated exercises however exercises did not increase pain, primarily stretch noted by pt.  Iontophoresis x55mn to L lateral elbow using 4 mg/mL of dexamethasone, intensity=1.9-2.0 with no adverse reactions (very mild pink coloration noted under electrode site.)                 OT Education - 10/18/20 1340    Education Details reviewed phase II exercises,and phase 3 stretching exercises, pt performed 10 reps each, discussion with pt regarding importance of proper positioning , handout provided with information regarding proper positioning to minimize pain    Person(s) Educated Patient    Methods Explanation;Handout    Comprehension Verbalized understanding;Returned demonstration            OT Short Term Goals - 09/16/20 1925      OT SHORT TERM GOAL #1   Title Pt will be independent with stretching HEP.--check STGs 10/16/20    Time 4    Period Weeks  Status New      OT SHORT TERM GOAL #2   Title Pt will be independent with activity modifications/improved positioning for ADLs and IADLs to decr pain.    Time 4    Period Weeks    Status New      OT SHORT TERM GOAL #3   Title Pt will be indepedent with splint and counterforce band wear/care.    Time 4    Period Weeks    Status New      OT SHORT TERM GOAL #4   Title Pt will report pain consistently less than or equal to 3/10 for light ADLs/IADLs.    Time 4    Period Weeks    Status New             OT Long Term Goals - 09/16/20 1927      OT LONG TERM GOAL #1   Title Pt will be indepedent with strengthening HEP.    Time 8    Period Weeks    Status New      OT LONG TERM GOAL #2   Title Pt will be able to lift dishes with LUE without pain.    Time 8    Period Weeks     Status New      OT LONG TERM GOAL #3   Title Pt will be able to perform supination with elbow extended to assist with patient evalutions without pain.    Time 8    Period Weeks    Status New      OT LONG TERM GOAL #4   Title Pt will report pain consistently less than or equal to 1/10 for typical ADLs/IADLs including sleep.    Time 8    Period Weeks    Status New      OT LONG TERM GOAL #5   Title Assess L grip strength and establish goal prn.    Time 8    Period Weeks    Status New                 Plan - 10/18/20 1107    Clinical Impression Statement Pt is progressing towards goals . Pt continues to report elbow pain.Pt is now completing  all phase 3 exercises passive stretching.    OT Occupational Profile and History Problem Focused Assessment - Including review of records relating to presenting problem    Occupational performance deficits (Please refer to evaluation for details): ADL's;IADL's;Work;Leisure;Rest and Sleep    Body Structure / Function / Physical Skills ADL;Strength;UE functional use;Decreased knowledge of use of DME;Decreased knowledge of precautions;Pain;Body mechanics;IADL    Rehab Potential Good    Clinical Decision Making Limited treatment options, no task modification necessary    Comorbidities Affecting Occupational Performance: None    Modification or Assistance to Complete Evaluation  No modification of tasks or assist necessary to complete eval    OT Frequency 2x / week    OT Duration 8 weeks   +eval; however, may be able to decr frequency depending on progress   OT Treatment/Interventions Self-care/ADL training;Moist Heat;Fluidtherapy;DME and/or AE instruction;Splinting;Therapeutic activities;Contrast Bath;Ultrasound;Therapeutic exercise;Passive range of motion;Iontophoresis;Cryotherapy;Electrical Stimulation;Paraffin;Manual Therapy;Patient/family education    Plan Continue with Ionto #5, continue with activity modification education prn, soft tissue  massage, review passive stretching exercises.    Consulted and Agree with Plan of Care Patient           Patient will benefit from skilled therapeutic intervention in order to improve the following deficits  and impairments:   Body Structure / Function / Physical Skills: ADL, Strength, UE functional use, Decreased knowledge of use of DME, Decreased knowledge of precautions, Pain, Body mechanics, IADL       Visit Diagnosis: Pain in left elbow  Muscle weakness (generalized)    Problem List Patient Active Problem List   Diagnosis Date Noted  . Vitamin D deficiency 07/14/2018  . Prediabetes 07/14/2018  . NAFLD (nonalcoholic fatty liver disease) 07/14/2018  . Multiple sclerosis ([place]) 02/24/2013  . Erythema nodosum 02/13/2013  . Acute transverse myelitis ([place]) 02/13/2013    Heather Moreno 10/18/2020, 1:41 PM Heather Moreno, Heather Moreno [phone] Phone: [phone] 1:43 PM 10/18/20 [facility] [address] [place], [place], [address] Phone: [phone]   Fax:  [phone]  Name: Heather Moreno MRN: [id number] Date of Birth: March 16, 1971

## 2020-10-28 ENCOUNTER — Ambulatory Visit (INDEPENDENT_AMBULATORY_CARE_PROVIDER_SITE_OTHER): Payer: 59 | Admitting: Family Medicine

## 2020-10-28 ENCOUNTER — Other Ambulatory Visit: Payer: Self-pay

## 2020-10-28 ENCOUNTER — Encounter (INDEPENDENT_AMBULATORY_CARE_PROVIDER_SITE_OTHER): Payer: Self-pay | Admitting: Family Medicine

## 2020-10-28 VITALS — BP 113/75 | HR 75 | Temp 98.1°F | Ht 62.0 in | Wt 155.0 lb

## 2020-10-28 DIAGNOSIS — M25522 Pain in left elbow: Secondary | ICD-10-CM

## 2020-10-28 DIAGNOSIS — R7303 Prediabetes: Secondary | ICD-10-CM

## 2020-10-28 DIAGNOSIS — E559 Vitamin D deficiency, unspecified: Secondary | ICD-10-CM

## 2020-10-28 DIAGNOSIS — E669 Obesity, unspecified: Secondary | ICD-10-CM

## 2020-10-28 DIAGNOSIS — G43809 Other migraine, not intractable, without status migrainosus: Secondary | ICD-10-CM | POA: Diagnosis not present

## 2020-10-28 DIAGNOSIS — Z9189 Other specified personal risk factors, not elsewhere classified: Secondary | ICD-10-CM | POA: Diagnosis not present

## 2020-10-28 DIAGNOSIS — E7849 Other hyperlipidemia: Secondary | ICD-10-CM

## 2020-10-28 DIAGNOSIS — Z6830 Body mass index (BMI) 30.0-30.9, adult: Secondary | ICD-10-CM

## 2020-10-28 MED ORDER — METFORMIN HCL 500 MG PO TABS: ORAL_TABLET | ORAL | 1 refills | Status: DC

## 2020-10-28 MED ORDER — VITAMIN D (ERGOCALCIFEROL) 1.25 MG (50000 UNIT) PO CAPS: 50000.0000 [IU] | ORAL_CAPSULE | ORAL | 1 refills | Status: DC

## 2020-10-28 MED ORDER — NURTEC 75 MG PO TBDP: 75.0000 mg | ORAL_TABLET | ORAL | 1 refills | Status: DC

## 2020-10-28 MED ORDER — ATORVASTATIN CALCIUM 10 MG PO TABS: 10.0000 mg | ORAL_TABLET | Freq: Every day | ORAL | 1 refills | Status: DC

## 2020-10-28 NOTE — Telephone Encounter (Signed)
Received another PA request for Nurtec. Reviewed approval letter from last PA and it does state that it is approved until 10/01/21. Contacted pt's pharmacy and informed them of this, and they will be contacting the pt's insurance company.

## 2020-10-29 ENCOUNTER — Encounter: Payer: 59 | Admitting: Occupational Therapy

## 2020-10-30 ENCOUNTER — Other Ambulatory Visit: Payer: Self-pay

## 2020-10-30 ENCOUNTER — Ambulatory Visit: Payer: 59 | Attending: Family Medicine | Admitting: Occupational Therapy

## 2020-10-30 ENCOUNTER — Encounter: Payer: Self-pay | Admitting: Occupational Therapy

## 2020-10-30 DIAGNOSIS — R2689 Other abnormalities of gait and mobility: Secondary | ICD-10-CM | POA: Insufficient documentation

## 2020-10-30 DIAGNOSIS — M6281 Muscle weakness (generalized): Secondary | ICD-10-CM

## 2020-10-30 DIAGNOSIS — M25522 Pain in left elbow: Secondary | ICD-10-CM

## 2020-10-30 NOTE — Therapy (Signed)
[facility] [address] [place], [place], [address] Phone: [phone]   Fax:  [phone]  Occupational Therapy Treatment  Patient Details  Name: Heather Moreno MRN: [id number] Date of Birth: May 05, 1971 Referring Provider (OT): Dr. Dennard Nip   Encounter Date: 10/30/2020   OT End of Session - 10/30/20 0754    Visit Number 9    Number of Visits 17    Date for OT Re-Evaluation 11/15/20    Authorization Type [employer] 60 visit limit combined OT/PT, covered 100% after deductible met    OT Start Time 0718    OT Stop Time 0805    OT Time Calculation (min) 47 min           Past Medical History:  Diagnosis Date  . Allergic rhinitis   . Complication of anesthesia    difficulty with weakness after spinal  . Environmental allergies   . GERD (gastroesophageal reflux disease)   . Headache(784.0)   . HLD (hyperlipidemia)   . IBS (irritable bowel syndrome)   . Lactose intolerance   . Migraine headache with aura   . Vitamin D deficiency     Past Surgical History:  Procedure Laterality Date  . cesarean sections  2009, 2003  . NASAL SINUS SURGERY Bilateral 1989    There were no vitals filed for this visit.   Subjective Assessment - 10/30/20 0753    Subjective  Pt reports pain is about the same    Pertinent History L tennis elbow/lateral elbow pain.        PMH:  hyperlipidemia, Vitamin D Deficiency, hx of headaches/migraine, hx of transverse myelitis    Currently in Pain? Yes    Pain Score 4     Pain Location Elbow    Pain Orientation Left    Pain Descriptors / Indicators Aching    Pain Type Acute pain    Pain Onset More than a month ago    Pain Frequency Intermittent    Aggravating Factors  overuse    Pain Relieving Factors rest, meds                 Treatment: [place] 26mz, 0.8 w/cm 2, 20% x 8 mins to left lateral elbow and dorsal forearm, no adverse reactions.  Soft tissue mobs to left elbow and left  forearm  (wrist/finger extensors)  Reviewed Lateral Epicondylitis AROM exercise #4, 10 reps  Then PROM exercise #1-4  (Phase 3 exercises)   Pt reports stretching with updated exercises however exercises did not increase pain, primarily stretch noted by pt.  Iontophoresis x272m to L lateral elbow using 4 mg/mL of dexamethasone, intensity=2.0 with no adverse reactions (very mild pink coloration and 1 water blister noted under electrode site.) Therapist recommended pt considers wearing counterforce band while at work to minimize pain and overuse. Pt verbalized understanding.                OT Short Term Goals - 10/30/20 1153      OT SHORT TERM GOAL #1   Title Pt will be independent with stretching HEP.--check STGs 10/16/20    Time 4    Period Weeks    Status Achieved      OT SHORT TERM GOAL #2   Title Pt will be independent with activity modifications/improved positioning for ADLs and IADLs to decr pain.    Time 4    Period Weeks    Status On-going      OT SHORT TERM GOAL #3  Title Pt will be indepedent with splint and counterforce band wear/care.    Time 4    Period Weeks    Status On-going      OT SHORT TERM GOAL #4   Title Pt will report pain consistently less than or equal to 3/10 for light ADLs/IADLs.    Time 4    Period Weeks    Status On-going             OT Long Term Goals - 09/16/20 1927      OT LONG TERM GOAL #1   Title Pt will be indepedent with strengthening HEP.    Time 8    Period Weeks    Status New      OT LONG TERM GOAL #2   Title Pt will be able to lift dishes with LUE without pain.    Time 8    Period Weeks    Status New      OT LONG TERM GOAL #3   Title Pt will be able to perform supination with elbow extended to assist with patient evalutions without pain.    Time 8    Period Weeks    Status New      OT LONG TERM GOAL #4   Title Pt will report pain consistently less than or equal to 1/10 for typical ADLs/IADLs including  sleep.    Time 8    Period Weeks    Status New      OT LONG TERM GOAL #5   Title Assess L grip strength and establish goal prn.    Time 8    Period Weeks    Status New                 Plan - 10/30/20 0755    Clinical Impression Statement Pt is progressing towards goals . Pt continues to report elbow pain. Pt is scheduled to see sports medicine on Friday.    OT Occupational Profile and History Problem Focused Assessment - Including review of records relating to presenting problem    Occupational performance deficits (Please refer to evaluation for details): ADL's;IADL's;Work;Leisure;Rest and Sleep    Body Structure / Function / Physical Skills ADL;Strength;UE functional use;Decreased knowledge of use of DME;Decreased knowledge of precautions;Pain;Body mechanics;IADL    Comorbidities Affecting Occupational Performance: None    Modification or Assistance to Complete Evaluation  No modification of tasks or assist necessary to complete eval    OT Frequency 2x / week    OT Duration 8 weeks    OT Treatment/Interventions Self-care/ADL training;Moist Heat;Fluidtherapy;DME and/or AE instruction;Splinting;Therapeutic activities;Contrast Bath;Ultrasound;Therapeutic exercise;Passive range of motion;Iontophoresis;Cryotherapy;Electrical Stimulation;Paraffin;Manual Therapy;Patient/family education    Plan Continue with last ionto  soft tissue massage, continue phase 3 exercises    Consulted and Agree with Plan of Care Patient           Patient will benefit from skilled therapeutic intervention in order to improve the following deficits and impairments:   Body Structure / Function / Physical Skills: ADL, Strength, UE functional use, Decreased knowledge of use of DME, Decreased knowledge of precautions, Pain, Body mechanics, IADL       Visit Diagnosis: Pain in left elbow  Muscle weakness (generalized)    Problem List Patient Active Problem List   Diagnosis Date Noted  . Vitamin D  deficiency 07/14/2018  . Prediabetes 07/14/2018  . NAFLD (nonalcoholic fatty liver disease) 07/14/2018  . Multiple sclerosis ([place]) 02/24/2013  . Erythema nodosum 02/13/2013  . Acute transverse myelitis (  [place]) 02/13/2013    Chantry Headen 10/30/2020, 11:54 AM  [facility] [address] [place], [place], [address] Phone: [phone]   Fax:  [phone]  Name: Heather Moreno MRN: [id number] Date of Birth: 1971/01/26

## 2020-10-31 NOTE — Progress Notes (Signed)
Subjective:    CC: Left lateral epicondlitis  I, Heather Moreno, LAT, ATC, am serving as scribe for Dr. Clementeen Graham.  HPI: Heather Moreno is a 49yo RHD female c/o L lateral elbow pain. Heather Moreno reports elbow pain has been ongoing since Sept 2021 w/ no specific MOI noted but was doing a lot more lifting in Aug/Sept when Heather Moreno was visiting Heather Moreno mom. Heather Moreno was referred OT by PCP, Dr. Quillian Quince, for the injury of which Heather Moreno's completed 8 visits. Today, Heather Moreno locates pain to Heather Moreno L lateral elbow that radiates into Heather Moreno L forearm.  Heather Moreno states that Heather Moreno has a hx of prior L lateral epicondylitis.  Numbness/tingling: no Decrease grip strength: yes Aggravates: repetitive L wrist/elbow activity/motion; type; sustained holding of Heather Moreno phone; L wrist extension Rx tried: OT including iontophoresis; heat; multiple braces for wrist and elbow; Advil  Pertinent review of Systems: No fevers or chills  Relevant historical information: History of transverse myelitis x1.  No multiple sclerosis diagnosed.  History of migraines typically well controlled.   Objective:    Vitals:   11/01/20 1059  BP: 120/76  Pulse: 94  SpO2: 97%   General: Well Developed, well nourished, and in no acute distress.   MSK: Left elbow normal-appearing Tender palpation lateral epicondyle. Normal elbow motion. Elbow strength is intact. Pain present with resisted wrist extension.  Lab and Radiology Results  Diagnostic Limited MSK Ultrasound of: Left elbow lateral epicondyle Hyperechoic change in superficial distal insertion site at lateral epicondyle consistent with an avulsion or chronic calcific tendinopathy. Increased vascular activity on Doppler indicative of neovascularity/inflammation No definitive tendon tear present. Impression: Lateral epicondylitis  X-ray images left elbow obtained today personally and independently interpreted Normal.  No fractures or severe degeneration Await formal radiology review   Impression and  Recommendations:    Assessment and Plan: 49 y.o. female with left elbow pain ongoing for greater than 3 months due to lateral epicondylitis.  Patient already has had great trial of conservative management including greater than 6 weeks of occupational therapy including iontophoresis with moderate benefit.  Heather Moreno additionally has used compression and counterforce braces with little benefit.  Heather Moreno also has tried a wrist splint at times which Heather Moreno finds to be more noxious than helpful most of the time. Discussed options.  At this point further conservative management would include nitroglycerin patch protocol and possibly a body helix compressive elbow sleeve.  Discussed risk of headaches with nitroglycerin.  Heather Moreno feels that the nitroglycerin is reasonable and worth the potential risk.  Plan to proceed with nitroglycerin patch protocol with exercises.  If not improved next step would very likely be PRP.  But Neyra myself think that steroid injections for this issue are likely to be temporary measures at best.  Could consider order MRI as well however patient has claustrophobia with MRIs and would like to avoid it if possible.  PDMP not reviewed this encounter. Orders Placed This Encounter  Procedures  . [place] LIMITED JOINT SPACE STRUCTURES UP LEFT(NO LINKED CHARGES)    Order Specific Question:   Reason for Exam (SYMPTOM  OR DIAGNOSIS REQUIRED)    Answer:   L elbow pain    Order Specific Question:   Preferred imaging location?    Answer:   Adult nurse [facility]  . DG ELBOW COMPLETE LEFT (3+VIEW)    Standing Status:   Future    Number of Occurrences:   1    Standing Expiration Date:   11/01/2021    Order Specific  Question:   Reason for Exam (SYMPTOM  OR DIAGNOSIS REQUIRED)    Answer:   eval left elbow pain    Order Specific Question:   Is patient pregnant?    Answer:   No    Order Specific Question:   Preferred imaging location?    Answer:   [facility]   Meds ordered this  encounter  Medications  . nitroGLYCERIN (NITRODUR - DOSED IN MG/24 HR) 0.2 mg/hr patch    Sig: Apply 1/4 patch daily to tendon for tendonitis.    Dispense:  30 patch    Refill:  1    Discussed warning signs or symptoms. Please see discharge instructions. Patient expresses understanding.   The above documentation has been reviewed and is accurate and complete Clementeen Graham, M.D.

## 2020-11-01 ENCOUNTER — Encounter: Payer: Self-pay | Admitting: Occupational Therapy

## 2020-11-01 ENCOUNTER — Ambulatory Visit (INDEPENDENT_AMBULATORY_CARE_PROVIDER_SITE_OTHER): Payer: 59 | Admitting: Family Medicine

## 2020-11-01 ENCOUNTER — Encounter: Payer: Self-pay | Admitting: Family Medicine

## 2020-11-01 ENCOUNTER — Ambulatory Visit: Payer: 59 | Admitting: Occupational Therapy

## 2020-11-01 ENCOUNTER — Other Ambulatory Visit: Payer: Self-pay

## 2020-11-01 ENCOUNTER — Ambulatory Visit: Payer: Self-pay

## 2020-11-01 ENCOUNTER — Ambulatory Visit (INDEPENDENT_AMBULATORY_CARE_PROVIDER_SITE_OTHER): Payer: 59

## 2020-11-01 VITALS — BP 120/76 | HR 94 | Ht 62.0 in | Wt 161.8 lb

## 2020-11-01 DIAGNOSIS — G43809 Other migraine, not intractable, without status migrainosus: Secondary | ICD-10-CM | POA: Diagnosis not present

## 2020-11-01 DIAGNOSIS — M25522 Pain in left elbow: Secondary | ICD-10-CM | POA: Diagnosis not present

## 2020-11-01 DIAGNOSIS — G43909 Migraine, unspecified, not intractable, without status migrainosus: Secondary | ICD-10-CM | POA: Insufficient documentation

## 2020-11-01 DIAGNOSIS — M6281 Muscle weakness (generalized): Secondary | ICD-10-CM

## 2020-11-01 MED ORDER — NITROGLYCERIN 0.2 MG/HR TD PT24: MEDICATED_PATCH | TRANSDERMAL | 1 refills | Status: DC

## 2020-11-01 NOTE — Patient Instructions (Signed)
Thank you for coming in today.  Try the nitro patch.   Continue the exercises.   Use the compression sleeve.   I recommend you obtained a compression sleeve to help with your joint problems. There are many options on the market however I recommend obtaining a full elbow Body Helix compression sleeve.  You can find information (including how to appropriate measure yourself for sizing) can be found at www.[employer].ch.  Many of these products are health savings account (HSA) eligible.   You can use the compression sleeve at any time throughout the day but is most important to use while being active as well as for 2 hours post-activity.   It is appropriate to ice following activity with the compression sleeve in place.   Nitroglycerin Protocol   Apply 1/4 nitroglycerin patch to affected area daily.  Change position of patch within the affected area every 24 hours.  You may experience a headache during the first 1-2 weeks of using the patch, these should subside.  If you experience headaches after beginning nitroglycerin patch treatment, you may take your preferred over the counter pain reliever.  Another side effect of the nitroglycerin patch is skin irritation or rash related to patch adhesive.  Please notify our office if you develop more severe headaches or rash, and stop the patch.  Tendon healing with nitroglycerin patch may require 12 to 24 weeks depending on the extent of injury.  Men should not use if taking Viagra, Cialis, or Levitra.   Do not use if you have migraines or rosacea.    If not better let me know next step is either MRI or PRP injection.

## 2020-11-01 NOTE — Therapy (Signed)
[facility] [address] [place], [place], [address] Phone: [phone]   Fax:  [phone]  Occupational Therapy Treatment  Patient Details  Name: Heather Moreno MRN: [id number] Date of Birth: 07-24-1971 Referring Provider (OT): Dr. Dennard Nip   Encounter Date: 11/01/2020   OT End of Session - 11/01/20 0941    Visit Number 10    Number of Visits 17    Date for OT Re-Evaluation 11/15/20    Authorization Type [employer] 60 visit limit combined OT/PT, covered 100% after deductible met    OT Start Time 0803    OT Stop Time 0850    OT Time Calculation (min) 47 min    Activity Tolerance Patient tolerated treatment well    Behavior During Therapy [facility] for tasks assessed/performed           Past Medical History:  Diagnosis Date  . Allergic rhinitis   . Complication of anesthesia    difficulty with weakness after spinal  . Environmental allergies   . GERD (gastroesophageal reflux disease)   . Headache(784.0)   . HLD (hyperlipidemia)   . IBS (irritable bowel syndrome)   . Lactose intolerance   . Migraine headache with aura   . Vitamin D deficiency     Past Surgical History:  Procedure Laterality Date  . cesarean sections  2009, 2003  . NASAL SINUS SURGERY Bilateral 1989    There were no vitals filed for this visit.   Subjective Assessment - 11/01/20 0845    Subjective  Pt reports that she awoke with more pain    Pertinent History L tennis elbow/lateral elbow pain.        PMH:  hyperlipidemia, Vitamin D Deficiency, hx of headaches/migraine, hx of transverse myelitis    Currently in Pain? Yes    Pain Score 4    up to 7/10   Pain Location Elbow    Pain Orientation Left    Pain Descriptors / Indicators Aching    Pain Type Acute pain    Pain Onset More than a month ago    Pain Frequency Intermittent    Aggravating Factors  overuse    Pain Relieving Factors rest , meds                     Treatment:Soft tissue mobs to left elbow and left forearm  (wrist/finger extensors) Reviewed Lateral Epicondylitis AROM exercises #4.  Pt returned demo x10   Then PROM exercise #1-4  10 reps each   Pt reports stretching with updated exercises however exercises did not increase pain, primarily stretch noted by pt.  Iontophoresis x42mn to L lateral elbow using 4 mg/mL of dexamethasone, intensity=2.0 with no adverse reactions (very mild pink coloration and 1 blister noted under electrode site.)  Discussed importance of proper positioning to minimize pain/ symptoms. Pt to discuss whether a night time extension splint would be beneficial, pt can order online.             OT Short Term Goals - 11/01/20 0940      OT SHORT TERM GOAL #1   Title Pt will be independent with stretching HEP.--check STGs 10/16/20    Time 4    Period Weeks    Status Achieved      OT SHORT TERM GOAL #2   Title Pt will be independent with activity modifications/improved positioning for ADLs and IADLs to decr pain.    Time 4    Period Weeks  Status On-going      OT SHORT TERM GOAL #3   Title Pt will be indepedent with splint and counterforce band wear/care.    Time 4    Period Weeks    Status Achieved   verbalizes understanding     OT SHORT TERM GOAL #4   Title Pt will report pain consistently less than or equal to 3/10 for light ADLs/IADLs.    Time 4    Period Weeks    Status On-going             OT Long Term Goals - 09/16/20 1927      OT LONG TERM GOAL #1   Title Pt will be indepedent with strengthening HEP.    Time 8    Period Weeks    Status New      OT LONG TERM GOAL #2   Title Pt will be able to lift dishes with LUE without pain.    Time 8    Period Weeks    Status New      OT LONG TERM GOAL #3   Title Pt will be able to perform supination with elbow extended to assist with patient evalutions without pain.    Time 8    Period Weeks    Status New       OT LONG TERM GOAL #4   Title Pt will report pain consistently less than or equal to 1/10 for typical ADLs/IADLs including sleep.    Time 8    Period Weeks    Status New      OT LONG TERM GOAL #5   Title Assess L grip strength and establish goal prn.    Time 8    Period Weeks    Status New                 Plan - 11/01/20 1610    Clinical Impression Statement Pt is progressing towards goals . Pt continues to report elbow pain. Pt is scheduled to see sports medicine on today. Therapist sent message to MD.    OT Occupational Profile and History Problem Focused Assessment - Including review of records relating to presenting problem    Occupational performance deficits (Please refer to evaluation for details): ADL's;IADL's;Work;Leisure;Rest and Sleep    Body Structure / Function / Physical Skills ADL;Strength;UE functional use;Decreased knowledge of use of DME;Decreased knowledge of precautions;Pain;Body mechanics;IADL    Comorbidities Affecting Occupational Performance: None    Modification or Assistance to Complete Evaluation  No modification of tasks or assist necessary to complete eval    OT Frequency 2x / week    OT Duration 8 weeks    OT Treatment/Interventions Self-care/ADL training;Moist Heat;Fluidtherapy;DME and/or AE instruction;Splinting;Therapeutic activities;Contrast Bath;Ultrasound;Therapeutic exercise;Passive range of motion;Iontophoresis;Cryotherapy;Electrical Stimulation;Paraffin;Manual Therapy;Patient/family education    Plan continue with massage, [place], consider dry needling with P.T.    Consulted and Agree with Plan of Care Patient           Patient will benefit from skilled therapeutic intervention in order to improve the following deficits and impairments:   Body Structure / Function / Physical Skills: ADL, Strength, UE functional use, Decreased knowledge of use of DME, Decreased knowledge of precautions, Pain, Body mechanics, IADL       Visit  Diagnosis: Pain in left elbow  Muscle weakness (generalized)    Problem List Patient Active Problem List   Diagnosis Date Noted  . Vitamin D deficiency 07/14/2018  . Prediabetes 07/14/2018  . NAFLD (nonalcoholic fatty  liver disease) 07/14/2018  . Multiple sclerosis ([place]) 02/24/2013  . Erythema nodosum 02/13/2013  . Acute transverse myelitis ([place]) 02/13/2013    Floyd Wade 11/01/2020, 9:42 AM  [facility] [address] [place], [place], [address] Phone: [phone]   Fax:  [phone]  Name: Heather Moreno MRN: [id number] Date of Birth: 06-30-1971

## 2020-11-04 ENCOUNTER — Encounter: Payer: Self-pay | Admitting: Occupational Therapy

## 2020-11-04 ENCOUNTER — Ambulatory Visit: Payer: 59 | Admitting: Occupational Therapy

## 2020-11-04 ENCOUNTER — Other Ambulatory Visit: Payer: Self-pay

## 2020-11-04 DIAGNOSIS — M25522 Pain in left elbow: Secondary | ICD-10-CM | POA: Diagnosis not present

## 2020-11-04 DIAGNOSIS — M6281 Muscle weakness (generalized): Secondary | ICD-10-CM

## 2020-11-04 NOTE — Therapy (Signed)
[facility] [address] [place], [place], [address] Phone: [phone]   Fax:  [phone]  Occupational Therapy Treatment  Patient Details  Name: Heather Moreno MRN: [id number] Date of Birth: 04-Nov-1971 Referring Provider (OT): Dr. Dennard Nip   Encounter Date: 11/04/2020   OT End of Session - 11/04/20 0720    Visit Number 11    Number of Visits 17    Date for OT Re-Evaluation 11/15/20    Authorization Type [employer] 60 visit limit combined OT/PT, covered 100% after deductible met    OT Start Time 0718    OT Stop Time 0758    OT Time Calculation (min) 40 min    Activity Tolerance Patient tolerated treatment well    Behavior During Therapy [facility] for tasks assessed/performed           Past Medical History:  Diagnosis Date  . Allergic rhinitis   . Complication of anesthesia    difficulty with weakness after spinal  . Environmental allergies   . GERD (gastroesophageal reflux disease)   . Headache(784.0)   . HLD (hyperlipidemia)   . IBS (irritable bowel syndrome)   . Lactose intolerance   . Migraine headache with aura   . Vitamin D deficiency     Past Surgical History:  Procedure Laterality Date  . cesarean sections  2009, 2003  . NASAL SINUS SURGERY Bilateral 1989    There were no vitals filed for this visit.   Subjective Assessment - 11/04/20 0718    Subjective  exercises don't bother me as much as they did.  Hasn't started nitroglycrin patch yet.    Pertinent History L tennis elbow/lateral elbow pain.        PMH:  hyperlipidemia, Vitamin D Deficiency, hx of headaches/migraine, hx of transverse myelitis    Currently in Pain? Yes    Pain Score 4     Pain Location Elbow    Pain Orientation Left    Pain Descriptors / Indicators Aching    Pain Type Acute pain    Pain Onset More than a month ago    Pain Frequency Intermittent    Aggravating Factors  use, wrist    Pain Relieving Factors rest            Discussed Friday's sport medicine appt and MD recommendations.  Pt reports that she ordered compression sleeve, but has not began nitroglycrin patch yet.  She plans to start this tomorrow.  Recommended pt remove morning of next appointment and do not replace until after appointment.  Ultrasound x 68mn to L lateral elbow 370m, continuous, 1.0wts/cm2 with no adverse reactions.  Soft tissue massage to L lateral forearm (wrist/finger extensors)  Reviewed AROM and PROM exercises (lateral epicondylitis phase I and II) #1-4.  Pt returned demo each without pain.  Discussed use of ice and/or ice massage after exercise (heat prior to exercise).  Pt will trial ice with compression sleeve once it arrives.   Ice massage performed x5m16mto L lateral elbow with no adverse reactions        OT Short Term Goals - 11/01/20 0940      OT SHORT TERM GOAL #1   Title Pt will be independent with stretching HEP.--check STGs 10/16/20    Time 4    Period Weeks    Status Achieved      OT SHORT TERM GOAL #2   Title Pt will be independent with activity modifications/improved positioning for ADLs and IADLs to decr pain.  Time 4    Period Weeks    Status On-going      OT SHORT TERM GOAL #3   Title Pt will be indepedent with splint and counterforce band wear/care.    Time 4    Period Weeks    Status Achieved   verbalizes understanding     OT SHORT TERM GOAL #4   Title Pt will report pain consistently less than or equal to 3/10 for light ADLs/IADLs.    Time 4    Period Weeks    Status On-going             OT Long Term Goals - 09/16/20 1927      OT LONG TERM GOAL #1   Title Pt will be indepedent with strengthening HEP.    Time 8    Period Weeks    Status New      OT LONG TERM GOAL #2   Title Pt will be able to lift dishes with LUE without pain.    Time 8    Period Weeks    Status New      OT LONG TERM GOAL #3   Title Pt will be able to perform supination with elbow extended to  assist with patient evalutions without pain.    Time 8    Period Weeks    Status New      OT LONG TERM GOAL #4   Title Pt will report pain consistently less than or equal to 1/10 for typical ADLs/IADLs including sleep.    Time 8    Period Weeks    Status New      OT LONG TERM GOAL #5   Title Assess L grip strength and establish goal prn.    Time 8    Period Weeks    Status New                 Plan - 11/04/20 1013    Clinical Impression Statement Pt is progressing slowly towards goals.  Pt reports decr overall pain and no pain with HEP, but continues to report pain in mornings and with quick movements during the day.    OT Occupational Profile and History Problem Focused Assessment - Including review of records relating to presenting problem    Occupational performance deficits (Please refer to evaluation for details): ADL's;IADL's;Work;Leisure;Rest and Sleep    Body Structure / Function / Physical Skills ADL;Strength;UE functional use;Decreased knowledge of use of DME;Decreased knowledge of precautions;Pain;Body mechanics;IADL    Comorbidities Affecting Occupational Performance: None    Modification or Assistance to Complete Evaluation  No modification of tasks or assist necessary to complete eval    OT Frequency 2x / week    OT Duration 8 weeks    OT Treatment/Interventions Self-care/ADL training;Moist Heat;Fluidtherapy;DME and/or AE instruction;Splinting;Therapeutic activities;Contrast Bath;Ultrasound;Therapeutic exercise;Passive range of motion;Iontophoresis;Cryotherapy;Electrical Stimulation;Paraffin;Manual Therapy;Patient/family education    Plan continue with massage, US, consider dry needling with P.T., check grip strength and establish goal prn    Consulted and Agree with Plan of Care Patient           Patient will benefit from skilled therapeutic intervention in order to improve the following deficits and impairments:   Body Structure / Function / Physical Skills:  ADL, Strength, UE functional use, Decreased knowledge of use of DME, Decreased knowledge of precautions, Pain, Body mechanics, IADL       Visit Diagnosis: Pain in left elbow  Muscle weakness (generalized)    Problem List Patient Active   Problem List   Diagnosis Date Noted  . Migraine 11/01/2020  . Vitamin D deficiency 07/14/2018  . Prediabetes 07/14/2018  . NAFLD (nonalcoholic fatty liver disease) 07/14/2018  . Erythema nodosum 02/13/2013  . Acute transverse myelitis ([facility]) 02/13/2013    FREEMAN,ANGELA 11/04/2020, 2:47 PM  [facility] [address] [place], [place], [address] Phone: [phone]   Fax:  [phone]  Name: Viktorya Carlson MRN: [id number] Date of Birth: 02/07/1971   Angela Freeman, OTR/L [facility] [address] , [place]  [address] [phone] phone [phone] 11/04/20 2:47 PM    

## 2020-11-04 NOTE — Progress Notes (Signed)
Left shoulder x-ray is normal to radiology

## 2020-11-04 NOTE — Progress Notes (Signed)
Chief Complaint:   OBESITY Heather Moreno is here to discuss her progress with her obesity treatment plan along with follow-up of her obesity related diagnoses. Heather Moreno is on keeping a food journal and adhering to recommended goals of 1500 calories and 90 grams of protein daily and states she is following her eating plan approximately 50% of the time. Heather Moreno states she is doing 0 minutes 0 times per week.  Today's visit was #: 16 Starting weight: 161 lbs Starting date: 06/30/2018 Today's weight: 155 lbs Today's date: 10/28/2020 Total lbs lost to date: 6 Total lbs lost since last in-office visit: 2  Interim History: Heather Moreno continues to do well with weight loss, but she has had a lot of extra challenges including her mother in-law moving in and issues with her elbow pain.   Subjective:   1. Left elbow pain Heather Moreno has been doing physical therapy with iontophoresis, but she is still having pain issues. She is open to a referral to sport medicine.  2. Vitamin D deficiency Heather Moreno is stable on Vit D, and she denies nausea or vomiting.  3. Pre-diabetes Heather Moreno is stable on metformin, and she is doing well with weight loss. She denies nausea or vomiting.  4. Other migraine without status migrainosus, not intractable Heather Moreno just started Nurtec and she feels it may be helping. She is avoiding too much exercise as this is a trigger for her migraines.  5. Other hyperlipidemia Heather Moreno's last LDL is elevated but HDL and triglycerides are within normal limits. She is not on a statin but she is doing well with diet.  6. At risk for heart disease Heather Moreno is at a higher than average risk for cardiovascular disease due to obesity.   Assessment/Plan:   1. Left elbow pain We will refer to Dr. Clementeen Graham for evaluation.  - Ambulatory referral to Sports Medicine  2. Vitamin D deficiency Low Vitamin D level contributes to fatigue and are associated with obesity, breast, and colon cancer. We will refill  prescription Vitamin D for 2 months. Heather Moreno will follow-up for routine testing of Vitamin D, at least 2-3 times per year to avoid over-replacement.  - Vitamin D, Ergocalciferol, (DRISDOL) 1.25 MG (50000 UNIT) CAPS capsule; Take 1 capsule (50,000 Units total) by mouth every 7 (seven) days.  Dispense: 4 capsule; Refill: 1  3. Pre-diabetes Heather Moreno will continue to work on weight loss, exercise, and decreasing simple carbohydrates to help decrease the risk of diabetes. We will refill metformin for 2 months.  - metFORMIN (GLUCOPHAGE) 500 MG tablet; Take one tablet by mouth twice a day  Dispense: 60 tablet; Refill: 1  4. Other migraine without status migrainosus, not intractable We will refill Nurtec for 2 months, Heather Moreno will continue to follow up as directed.  - Rimegepant Sulfate (NURTEC) 75 MG TBDP; Take 75 mg by mouth every other day.  Dispense: 16 tablet; Refill: 1  5. Other hyperlipidemia Cardiovascular risk and specific lipid/LDL goals reviewed. We discussed several lifestyle modifications today. Heather Moreno agreed to start Lipitor 10 mg qhs #30 with 1 refill. She will continue to work on diet, exercise and weight loss efforts. Orders and follow up as documented in patient record.   - atorvastatin (LIPITOR) 10 MG tablet; Take 1 tablet (10 mg total) by mouth at bedtime.  Dispense: 30 tablet; Refill: 1  6. At risk for heart disease Heather Moreno was given approximately 15 minutes of coronary artery disease prevention counseling today. She is 49 y.o. female and has risk factors for  heart disease including obesity. We discussed intensive lifestyle modifications today with an emphasis on specific weight loss instructions and strategies.   Repetitive spaced learning was employed today to elicit superior memory formation and behavioral change.  7. Class 1 obesity with serious comorbidity and body mass index (BMI) of 30.0 to 30.9 in adult, unspecified obesity type Heather Moreno is currently in the action stage of change. As  such, her goal is to continue with weight loss efforts. She has agreed to keeping a food journal and adhering to recommended goals of 1400-1500 calories and 90+ grams of protein daily.   Behavioral modification strategies: increasing lean protein intake and holiday eating strategies .  Satin has agreed to follow-up with our clinic in 8 weeks. She was informed of the importance of frequent follow-up visits to maximize her success with intensive lifestyle modifications for her multiple health conditions.   Objective:   Blood pressure 113/75, pulse 75, temperature 98.1 F (36.7 C), height 5\' 2"  (1.575 m), weight 155 lb (70.3 kg), SpO2 98 %. Body mass index is 28.35 kg/m.  General: Cooperative, alert, well developed, in no acute distress. HEENT: Conjunctivae and lids unremarkable. Cardiovascular: Regular rhythm.  Lungs: Normal work of breathing. Neurologic: No focal deficits.   Lab Results  Component Value Date   CREATININE 0.88 08/21/2020   BUN 16 08/21/2020   NA 140 08/21/2020   K 5.0 08/21/2020   CL 102 08/21/2020   CO2 24 08/21/2020   Lab Results  Component Value Date   ALT 25 08/21/2020   AST 17 08/21/2020   ALKPHOS 103 08/21/2020   BILITOT 0.2 08/21/2020   Lab Results  Component Value Date   HGBA1C 6.0 (H) 08/21/2020   HGBA1C 6.2 (H) 05/22/2020   HGBA1C 6.2 (H) 01/24/2020   HGBA1C 6.0 (H) 11/07/2018   HGBA1C 6.3 (H) 06/30/2018   Lab Results  Component Value Date   INSULIN 23.0 08/21/2020   INSULIN 27.0 (H) 05/22/2020   INSULIN 28.5 (H) 01/24/2020   INSULIN 16.1 11/07/2018   INSULIN 25.3 (H) 06/30/2018   Lab Results  Component Value Date   TSH 2.460 01/24/2020   Lab Results  Component Value Date   CHOL 253 (H) 08/21/2020   HDL 76 08/21/2020   LDLCALC 160 (H) 08/21/2020   TRIG 98 08/21/2020   Lab Results  Component Value Date   WBC 7.2 08/21/2020   HGB 12.6 08/21/2020   HCT 39.1 08/21/2020   MCV 89 08/21/2020   PLT 375 08/21/2020   No results  found for: IRON, TIBC, FERRITIN  Attestation Statements:   Reviewed by clinician on day of visit: allergies, medications, problem list, medical history, surgical history, family history, social history, and previous encounter notes.   I, 08/23/2020, am acting as transcriptionist for Burt Knack, MD.  I have reviewed the above documentation for accuracy and completeness, and I agree with the above. - Quillian Quince, MD

## 2020-11-05 ENCOUNTER — Telehealth: Payer: Self-pay | Admitting: Family Medicine

## 2020-11-05 DIAGNOSIS — M7712 Lateral epicondylitis, left elbow: Secondary | ICD-10-CM

## 2020-11-05 NOTE — Telephone Encounter (Signed)
-----   Message from Colonel Bald, OT sent at 11/01/2020  9:33 AM EST ----- Regarding: FW: patient treatment Dr. Denyse Amass,  After I sent the first message I spoke with one of out PT's who performs dry needling. She says that this may be an option for symptom management/ treatment.  Do you think this would be beneficial? If so please place a referral for P.T. for dry needling. Thanks, Keene Breath, OTR/L ----- Message ----- From: Colonel Bald, OT Sent: 11/01/2020   8:32 AM EST To: Marlis Edelson, OT, Rodolph Bong, MD, # Subject: patient treatment                              Dr. Denyse Amass, We have been seeing Dr. Frances Furbish for treatment of lateral epicondylitis. She completed her 6th iontophoresis treatment with dexamethasone today. She has been performing the phase II AROM exercises and phase III passive stretching exercises from the [place] [facility] protocol. She continues to experience significant pain at times. She awoke today with pain 7/10, when she arrived to therapy her pain was 4/10. She has a counterforce brace for her elbow and a wrist brace. She sleeps in a compressive sleeve.  Please advise how we should proceed , as she is not improving as quickly as anticipated. (Please copy Willa Frater, OTR/L  on your response, as she sees her Monday a.m. for treatment.)  Sincerely, Samara Deist Rine, OTR/L

## 2020-11-06 ENCOUNTER — Ambulatory Visit: Payer: 59 | Admitting: Occupational Therapy

## 2020-11-06 ENCOUNTER — Other Ambulatory Visit: Payer: Self-pay

## 2020-11-06 DIAGNOSIS — M6281 Muscle weakness (generalized): Secondary | ICD-10-CM

## 2020-11-06 DIAGNOSIS — M25522 Pain in left elbow: Secondary | ICD-10-CM

## 2020-11-06 NOTE — Therapy (Signed)
[facility] [address] [place], [place], [address] Phone: [phone]   Fax:  [phone]  Occupational Therapy Treatment  Patient Details  Name: Heather Moreno MRN: [id number] Date of Birth: 1970/12/01 Referring Provider (OT): Dr. Dennard Nip   Encounter Date: 11/06/2020   OT End of Session - 11/06/20 0742    Visit Number 12    Number of Visits 17    Date for OT Re-Evaluation 11/15/20    Authorization Type [employer] 60 visit limit combined OT/PT, covered 100% after deductible met    OT Start Time 0718    OT Stop Time 0756    OT Time Calculation (min) 38 min           Past Medical History:  Diagnosis Date  . Allergic rhinitis   . Complication of anesthesia    difficulty with weakness after spinal  . Environmental allergies   . GERD (gastroesophageal reflux disease)   . Headache(784.0)   . HLD (hyperlipidemia)   . IBS (irritable bowel syndrome)   . Lactose intolerance   . Migraine headache with aura   . Vitamin D deficiency     Past Surgical History:  Procedure Laterality Date  . cesarean sections  2009, 2003  . NASAL SINUS SURGERY Bilateral 1989    There were no vitals filed for this visit.   Subjective Assessment - 11/06/20 0744    Subjective  Pt reports she used nitroglycerin yesterday    Pertinent History L tennis elbow/lateral elbow pain.        PMH:  hyperlipidemia, Vitamin D Deficiency, hx of headaches/migraine, hx of transverse myelitis    Currently in Pain? Yes    Pain Score 3     Pain Location Elbow    Pain Orientation Left    Pain Descriptors / Indicators Aching    Pain Type Acute pain    Pain Onset More than a month ago    Pain Frequency Intermittent    Aggravating Factors  use    Pain Relieving Factors rest, heat, ice                   Treatment: [place] 3mz 0.8 w/cm 2, 20% x 8 mins to left elbow, no adverse reactions. Soft tissue mobs to left elbow and left forearm   (wrist/finger extensors) Reviewed Lateral Epicondylitis AROM exercise #4.  Pt performed 10 reps each Then PROM exercise #1-4  10 reps each no adverse reactions.  Ice massage to left elbow x 5 mins, no adverse reactions           OT Short Term Goals - 11/06/20 0743      OT SHORT TERM GOAL #1   Title Pt will be independent with stretching HEP.--check STGs 10/16/20    Time 4    Period Weeks    Status Achieved      OT SHORT TERM GOAL #2   Title Pt will be independent with activity modifications/improved positioning for ADLs and IADLs to decr pain.    Time 4    Period Weeks    Status On-going      OT SHORT TERM GOAL #3   Title Pt will be indepedent with splint and counterforce band wear/care.    Time 4    Period Weeks    Status Achieved   verbalizes understanding     OT SHORT TERM GOAL #4   Title Pt will report pain consistently less than or equal to 3/10 for light  ADLs/IADLs.    Time 4    Period Weeks    Status On-going   up to 4/10 at times            OT Long Term Goals - 09/16/20 1927      OT LONG TERM GOAL #1   Title Pt will be indepedent with strengthening HEP.    Time 8    Period Weeks    Status New      OT LONG TERM GOAL #2   Title Pt will be able to lift dishes with LUE without pain.    Time 8    Period Weeks    Status New      OT LONG TERM GOAL #3   Title Pt will be able to perform supination with elbow extended to assist with patient evalutions without pain.    Time 8    Period Weeks    Status New      OT LONG TERM GOAL #4   Title Pt will report pain consistently less than or equal to 1/10 for typical ADLs/IADLs including sleep.    Time 8    Period Weeks    Status New      OT LONG TERM GOAL #5   Title Assess L grip strength and establish goal prn.    Time 8    Period Weeks    Status New                  Patient will benefit from skilled therapeutic intervention in order to improve the following deficits and impairments:            Visit Diagnosis: Pain in left elbow  Muscle weakness (generalized)    Problem List Patient Active Problem List   Diagnosis Date Noted  . Migraine 11/01/2020  . Vitamin D deficiency 07/14/2018  . Prediabetes 07/14/2018  . NAFLD (nonalcoholic fatty liver disease) 07/14/2018  . Erythema nodosum 02/13/2013  . Acute transverse myelitis ([facility]) 02/13/2013    Heather Moreno 11/06/2020, 7:46 AM  [facility] [address] [place], [place], [address] Phone: [phone]   Fax:  [phone]  Name: Heather Moreno MRN: [id number] Date of Birth: 08/13/1971

## 2020-11-11 ENCOUNTER — Ambulatory Visit: Payer: 59 | Admitting: Physical Therapy

## 2020-11-11 ENCOUNTER — Other Ambulatory Visit: Payer: Self-pay

## 2020-11-11 ENCOUNTER — Encounter: Payer: Self-pay | Admitting: Occupational Therapy

## 2020-11-11 ENCOUNTER — Encounter: Payer: Self-pay | Admitting: Physical Therapy

## 2020-11-11 ENCOUNTER — Ambulatory Visit: Payer: 59 | Admitting: Occupational Therapy

## 2020-11-11 DIAGNOSIS — M6281 Muscle weakness (generalized): Secondary | ICD-10-CM

## 2020-11-11 DIAGNOSIS — M25522 Pain in left elbow: Secondary | ICD-10-CM

## 2020-11-11 DIAGNOSIS — R2689 Other abnormalities of gait and mobility: Secondary | ICD-10-CM

## 2020-11-11 NOTE — Patient Instructions (Signed)

## 2020-11-11 NOTE — Therapy (Signed)
[facility] [address] [place], [place], [address] Phone: [phone]   Fax:  [phone]  Occupational Therapy Treatment  Patient Details  Name: Heather Moreno MRN: [id number] Date of Birth: [birth year] Referring Provider (OT): Dr. Dennard Nip   Encounter Date: 11/11/2020   OT End of Session - 11/11/20 0759    Visit Number 13    Number of Visits 17    Date for OT Re-Evaluation 11/20/20    Authorization Type [employer] 60 visit limit combined OT/PT, covered 100% after deductible met    Authorization Time Period week 7/8    OT Start Time 0722    OT Stop Time 0800    OT Time Calculation (min) 38 min    Activity Tolerance Patient tolerated treatment well    Behavior During Therapy [facility] for tasks assessed/performed           Past Medical History:  Diagnosis Date  . Allergic rhinitis   . Complication of anesthesia    difficulty with weakness after spinal  . Environmental allergies   . GERD (gastroesophageal reflux disease)   . Headache(784.0)   . HLD (hyperlipidemia)   . IBS (irritable bowel syndrome)   . Lactose intolerance   . Migraine headache with aura   . Vitamin D deficiency     Past Surgical History:  Procedure Laterality Date  . cesarean sections  2009, 2003  . NASAL SINUS SURGERY Bilateral 1989    There were no vitals filed for this visit.   Subjective Assessment - 11/11/20 0747    Subjective  Pt reports she used 1/4 patch nitroglycerin for last few days, doesn't like new compression sleeve because it is sweaty and has seam at elbow that seems to irritate.  Pt reports pain "is better than it was" and reports night continues to bother her the most.    Pertinent History L tennis elbow/lateral elbow pain.        PMH:  hyperlipidemia, Vitamin D Deficiency, hx of headaches/migraine, hx of transverse myelitis    Currently in Pain? Yes    Pain Score 3     Pain Location Elbow    Pain Orientation Left     Pain Descriptors / Indicators Aching    Pain Type Acute pain    Pain Onset More than a month ago    Pain Frequency Intermittent    Aggravating Factors  use    Pain Relieving Factors rest, heat, ice             Ultrasound x 58mn to L lateral elbow 37m, continuous, 1.0wts/cm2 with no adverse reactions.  Soft tissue massage to L lateral forearm (wrist/finger extensors)  Lateral epicondylitis phase I AROM exercises #3-4 and phase II PROM exercises #1-4 x10 each         OT Short Term Goals - 11/06/20 0743      OT SHORT TERM GOAL #1   Title Pt will be independent with stretching HEP.--check STGs 10/16/20    Time 4    Period Weeks    Status Achieved      OT SHORT TERM GOAL #2   Title Pt will be independent with activity modifications/improved positioning for ADLs and IADLs to decr pain.    Time 4    Period Weeks    Status On-going      OT SHORT TERM GOAL #3   Title Pt will be indepedent with splint and counterforce band wear/care.    Time 4  Period Weeks    Status Achieved   verbalizes understanding     OT SHORT TERM GOAL #4   Title Pt will report pain consistently less than or equal to 3/10 for light ADLs/IADLs.    Time 4    Period Weeks    Status On-going   up to 4/10 at times            OT Long Term Goals - 11/11/20 1221      OT LONG TERM GOAL #1   Title Pt will be indepedent with strengthening HEP.    Time 8    Period Weeks    Status New      OT LONG TERM GOAL #2   Title Pt will be able to lift dishes with LUE without pain.    Time 8    Period Weeks    Status New      OT LONG TERM GOAL #3   Title Pt will be able to perform supination with elbow extended to assist with patient evalutions without pain.    Time 8    Period Weeks    Status On-going      OT LONG TERM GOAL #4   Title Pt will report pain consistently less than or equal to 1/10 for typical ADLs/IADLs including sleep.    Time 8    Period Weeks    Status New      OT LONG TERM GOAL  #5   Title Pt will demo at least 45lbs L grip strength for ADLs/IADLs.    Baseline R-24lbs, L-60lbs    Time 8    Period Weeks    Status New                 Plan - 11/11/20 0759    Clinical Impression Statement Pt is progressing slowly towards goals.  Pt reports decr overall however it continues to persist.  Pt with PT eval today to begin dry needling.    OT Occupational Profile and History Problem Focused Assessment - Including review of records relating to presenting problem    Occupational performance deficits (Please refer to evaluation for details): ADL's;IADL's;Work;Leisure;Rest and Sleep    Body Structure / Function / Physical Skills ADL;Strength;UE functional use;Decreased knowledge of use of DME;Decreased knowledge of precautions;Pain;Body mechanics;IADL    Comorbidities Affecting Occupational Performance: None    Modification or Assistance to Complete Evaluation  No modification of tasks or assist necessary to complete eval    OT Frequency 2x / week    OT Duration 8 weeks    OT Treatment/Interventions Self-care/ADL training;Moist Heat;Fluidtherapy;DME and/or AE instruction;Splinting;Therapeutic activities;Contrast Bath;Ultrasound;Therapeutic exercise;Passive range of motion;Iontophoresis;Cryotherapy;Electrical Stimulation;Paraffin;Manual Therapy;Patient/family education    Plan continue with massage, Ultrasound, (dry needling appt after OT today)    Consulted and Agree with Plan of Care Patient           Patient will benefit from skilled therapeutic intervention in order to improve the following deficits and impairments:   Body Structure / Function / Physical Skills: ADL,Strength,UE functional use,Decreased knowledge of use of DME,Decreased knowledge of precautions,Pain,Body mechanics,IADL       Visit Diagnosis: Other abnormalities of gait and mobility  Muscle weakness (generalized)    Problem List Patient Active Problem List   Diagnosis Date Noted  .  Migraine 11/01/2020  . Vitamin D deficiency 07/14/2018  . Prediabetes 07/14/2018  . NAFLD (nonalcoholic fatty liver disease) 07/14/2018  . Erythema nodosum 02/13/2013  . Acute transverse myelitis ([place]) 02/13/2013  [facility] 11/11/2020, 12:28 PM  [facility] [address] [place], [place], [address] Phone: [phone]   Fax:  [phone]  Name: Heather Moreno MRN: [id number] Date of Birth: [birth year]

## 2020-11-11 NOTE — Therapy (Signed)
[facility] [address] [place], [place], [address] Phone: [phone]   Fax:  [phone]  Physical Therapy Evaluation  Patient Details  Name: Heather Moreno MRN: [id number] Date of Birth: 26-May-1971 Referring Provider (PT): Rodolph Bong, MD   Encounter Date: 11/11/2020   PT End of Session - 11/11/20 1000    Visit Number 1    Number of Visits 7    Date for PT Re-Evaluation 12/26/20    Authorization Type [employer]    PT Start Time 0800    PT Stop Time 0848    PT Time Calculation (min) 48 min    Equipment Utilized During Treatment Other (comment)   dry needles   Activity Tolerance Patient tolerated treatment well    Behavior During Therapy [facility] for tasks assessed/performed           Past Medical History:  Diagnosis Date  . Allergic rhinitis   . Complication of anesthesia    difficulty with weakness after spinal  . Environmental allergies   . GERD (gastroesophageal reflux disease)   . Headache(784.0)   . HLD (hyperlipidemia)   . IBS (irritable bowel syndrome)   . Lactose intolerance   . Migraine headache with aura   . Vitamin D deficiency     Past Surgical History:  Procedure Laterality Date  . cesarean sections  2009, 2003  . NASAL SINUS SURGERY Bilateral 1989    There were no vitals filed for this visit.    Subjective Assessment - 11/11/20 0805    Subjective L lateral epicondylitis pain began in August after traveling.  Returned from [place] and felt like she did something that irritated it - possibly lifting and carrying heavy suitcases, grocery shopping/carrying groceries while in [place].  No single incident that brought on pain.  Pain began days > weeks after returning.  Had x-ray and ultrasound performed.  Has been performing OT; LUE is better than it has been - burning is not constant but can be re-irritated with certain movements; more irritated at the end of the day due to work.  Is trying to  dictate notes more.  Has attempted to wear a off loading brace but is going to bring it to her next appointment with OT.  Also has wrist braces to limit wrist extension; is hard to wear at work.    Pertinent History allergic rhinitis, GERD, migraine, HLD, IBS, lactose intolerance, vitamin D deficiency    Patient Stated Goals Sleep and wake up with less pain (not sleeping well, sweating, restless, Menopause?)    Currently in Pain? Yes              [facility] PT Assessment - 11/11/20 0818      Assessment   Medical Diagnosis Lateral epicondylitis    Referring Provider (PT) Rodolph Bong, MD    Onset Date/Surgical Date 11/05/20    Hand Dominance Right    Prior Therapy receiving OT      Precautions   Precautions None    Required Braces or Orthoses Other Brace/Splint    Other Brace/Splint has wrist splint and elbow splint, not able to wear consistently      Restrictions   Weight Bearing Restrictions No      Prior Function   Level of Independence Independent    Vocation Full time employment    Vocation Requirements Physician; typing, evaluating patients    Leisure has been walking for exercise with weights around wrists and ankles and doing some  resistance bands (but not recently or consistently).  Crocheting.      Observation/Other Assessments   Skin Integrity Good, no changes in skin color or temperature      Sensation   Light Touch Impaired by gross assessment    Additional Comments Is very protective of LUE; sensation is intact, no numbness and tingling but reports hyperalgesia.  No changes in temperature and color.  Has purchased a [employer] bike.      ROM / Strength   AROM / PROM / Strength AROM;Strength      AROM   Overall AROM  Within functional limits for tasks performed    Overall AROM Comments Slight pain with active wrist and finger extension and ulnar deviation.  [facility] reported stretching, no pain      Strength   Overall Strength Deficits    Overall  Strength Comments L wrist extension with forearm in neutral and then with forearm in pronation: 3+/5 with pain upon resistance.  Also reports pain medially with resisted flexion.    Strength Assessment Site Hand;Wrist    Right/Left Wrist Left    Left Wrist Flexion 4/5    Left Wrist Extension 3+/5    Left Wrist Radial Deviation 3/5    Left Wrist Ulnar Deviation 3/5    Right/Left hand Left    Left Hand Grip (lbs) 30   R hand 70lb     Palpation   Palpation comment Multiple trigger points noted in wrist and finger extensor muscle groups on L forearm and in L supinator muscle                      Objective measurements completed on examination: See above findings.       [facility] Adult PT Treatment/Exercise - 11/11/20 0958      Therapeutic Activites    Therapeutic Activities Other Therapeutic Activities    Other Therapeutic Activities Educated pt on purpose and methodology of trigger point dry needling.  Screened patient for contraindications - none indicated - one precaution noted - pt reports a sensitivity to nickel and will have itching if she wears metal jewelry for multiple days.  No other reactions noted.  Educated pt on symptoms during TDN and possible side effects; also educated pt on how to mitigate side effects with hydration, ice and stretching.            Trigger Point Dry Needling - 11/11/20 0956    Consent Given? Yes    Education Handout Provided Yes    Muscles Treated Wrist/Hand Extensor carpi radialis longus/brevis;Extensor digitorum;Extensor carpi ulnaris    Dry Needling Comments Performed in sitting with LUE supported in pronation    Extensor carpi radialis longus/brevis Response Twitch response elicited;Palpable increased muscle length    Extensor digitorum Response Twitch response elicited;Palpable increased muscle length    Extensor carpi ulnaris Response Twitch response elicited;Palpable increased muscle length                PT Education -  11/11/20 1018    Education Details clinical findings, PT POC and goals, trigger point dry needling    Person(s) Educated Patient    Methods Explanation;Demonstration;Handout    Comprehension Verbalized understanding               PT Long Term Goals - 11/11/20 1009      PT LONG TERM GOAL #1   Title Pt will report 50% improvement in painful symptoms after a full night's  sleep and after a full work day.    Time 6    Period Weeks    Status New    Target Date 12/26/20      PT LONG TERM GOAL #2   Title Pt will receive pain neuroscience education and will begin to identify ways to reduce catastrophizing thoughts and use of pain acknowledgement scale with activities    Time 6    Period Weeks    Status New    Target Date 12/26/20      PT LONG TERM GOAL #3   Title Pt will report return to walking program or use of stationary bike for aerobic conditioning    Time 6    Period Weeks    Status New    Target Date 12/26/20      PT LONG TERM GOAL #4   Title Due to decreased pain and improved ability to participate in OT strengthening HEP - pt will demonstrate 10-15lb increase in L hand grip    Baseline 30 lb L, 70 lb R    Time 6    Period Weeks    Status New    Target Date 12/26/20                  Plan - 11/11/20 1001    Clinical Impression Statement Pt is a 49 year old female referred to Neuro OPPT for evaluation of L lateral epicondylitis who is currently being treated by occupational therapy and has completed iontophoresis, ROM and strengthening; it was felt that pt would also benefit from trigger point dry needling, manual therapy and pain neuroscience education.  Pt's PMH is significant for the following: allergic rhinitis, GERD, migraine, HLD, IBS, lactose intolerance, vitamin D deficiency. The following deficits were noted during pt's exam: impaired hand and forearm strength, hyperalgesia and pain with active and resisted movement with multiple painful myofascial trigger  points noted in L wrist and finger extensors and supinator muscle and decreased functional use of LUE.  Pt would benefit from skilled PT to address these impairments and functional limitations to maximize functional use of LUE for home/community ADLs and occupation    Personal Factors and Comorbidities Comorbidity 3+;Fitness;Profession;Time since onset of injury/illness/exacerbation    Comorbidities allergic rhinitis, GERD, migraine, HLD, IBS, lactose intolerance, vitamin D deficiency    Examination-Activity Limitations Carry;Lift;Sleep    Examination-Participation Restrictions Cleaning;Occupation;Meal Prep    Stability/Clinical Decision Making Evolving/Moderate complexity    Clinical Decision Making Moderate    Rehab Potential Good    PT Frequency 1x / week    PT Duration 6 weeks    PT Treatment/Interventions ADLs/Self Care Home Management;Cryotherapy;Electrical Stimulation;Moist Heat;Therapeutic activities;Therapeutic exercise;Neuromuscular re-education;Patient/family education;Manual techniques;Passive range of motion;Dry needling;Taping    PT Next Visit Plan PNE, how was response to TDN?  Do further TDN, taping.  Walking or bike for aerobic    Consulted and Agree with Plan of Care Patient           Patient will benefit from skilled therapeutic intervention in order to improve the following deficits and impairments:  Decreased activity tolerance,Decreased strength,Increased fascial restricitons,Impaired perceived functional ability,Impaired sensation,Impaired UE functional use,Pain  Visit Diagnosis: Pain in left elbow  Muscle weakness (generalized)     Problem List Patient Active Problem List   Diagnosis Date Noted  . Migraine 11/01/2020  . Vitamin D deficiency 07/14/2018  . Prediabetes 07/14/2018  . NAFLD (nonalcoholic fatty liver disease) 20/94/7096  . Erythema nodosum 02/13/2013  . Acute transverse myelitis ([facility]) 02/13/2013  Dierdre Highman, PT, DPT 11/11/20    10:19  AM    [facility] [address] [place], [place], [address] Phone: [phone]   Fax:  [phone]  Name: Heather Moreno MRN: [id number] Date of Birth: [birth year]

## 2020-11-14 ENCOUNTER — Ambulatory Visit: Payer: 59 | Admitting: Occupational Therapy

## 2020-11-14 ENCOUNTER — Other Ambulatory Visit: Payer: Self-pay

## 2020-11-14 DIAGNOSIS — M25522 Pain in left elbow: Secondary | ICD-10-CM

## 2020-11-14 DIAGNOSIS — M6281 Muscle weakness (generalized): Secondary | ICD-10-CM

## 2020-11-14 NOTE — Therapy (Signed)
[facility] [address] [place], [place], [address] Phone: [phone]   Fax:  [phone]  Occupational Therapy Treatment  Patient Details  Name: Heather Moreno MRN: [id number] Date of Birth: 21-May-1971 Referring Provider (OT): Dr. Dennard Nip   Encounter Date: 11/14/2020   OT End of Session - 11/14/20 0830    Visit Number 14    Number of Visits 17    Date for OT Re-Evaluation 11/20/20    Authorization Type [employer] 60 visit limit combined OT/PT, covered 100% after deductible met    Authorization Time Period week 7/8    OT Start Time 0720    OT Stop Time 0800    OT Time Calculation (min) 40 min    Activity Tolerance Patient tolerated treatment well    Behavior During Therapy [facility] for tasks assessed/performed           Past Medical History:  Diagnosis Date  . Allergic rhinitis   . Complication of anesthesia    difficulty with weakness after spinal  . Environmental allergies   . GERD (gastroesophageal reflux disease)   . Headache(784.0)   . HLD (hyperlipidemia)   . IBS (irritable bowel syndrome)   . Lactose intolerance   . Migraine headache with aura   . Vitamin D deficiency     Past Surgical History:  Procedure Laterality Date  . cesarean sections  2009, 2003  . NASAL SINUS SURGERY Bilateral 1989    There were no vitals filed for this visit.      Ultrasound x 77mn to L lateral/dorsal elbow 129m, continuous, 1.0wts/cm2 with no adverse reactions.  Soft tissue massage to L lateral/dorsal forearm (wrist/finger extensors)  Lateral epicondylitis phase I AROM exercises #4 and phase II PROM exercises #1-4 x10 each.  Pt reports some cramping at medial forearm with exercises initially only (and at home the last few days).  Recommended pt incr water intake as pt reports that she has been drinking less overall the last few days which could be contributing.        OT Short Term Goals - 11/06/20 0743       OT SHORT TERM GOAL #1   Title Pt will be independent with stretching HEP.--check STGs 10/16/20    Time 4    Period Weeks    Status Achieved      OT SHORT TERM GOAL #2   Title Pt will be independent with activity modifications/improved positioning for ADLs and IADLs to decr pain.    Time 4    Period Weeks    Status On-going      OT SHORT TERM GOAL #3   Title Pt will be indepedent with splint and counterforce band wear/care.    Time 4    Period Weeks    Status Achieved   verbalizes understanding     OT SHORT TERM GOAL #4   Title Pt will report pain consistently less than or equal to 3/10 for light ADLs/IADLs.    Time 4    Period Weeks    Status On-going   up to 4/10 at times            OT Long Term Goals - 11/14/20 084008    OT LONG TERM GOAL #1   Title Pt will be indepedent with strengthening HEP.    Time 8    Period Weeks    Status On-going   11/14/20:  unable to progress to strengthening yet due to pain  OT LONG TERM GOAL #2   Title Pt will be able to lift dishes with LUE without pain.    Time 8    Period Weeks    Status New      OT LONG TERM GOAL #3   Title Pt will be able to perform supination with elbow extended to assist with patient evalutions without pain.    Time 8    Period Weeks    Status On-going      OT LONG TERM GOAL #4   Title Pt will report pain consistently less than or equal to 1/10 for typical ADLs/IADLs including sleep.    Time 8    Period Weeks    Status New      OT LONG TERM GOAL #5   Title Pt will demo at least 45lbs L grip strength for ADLs/IADLs.    Baseline R-24lbs, L-60lbs    Time 8    Period Weeks    Status New                 Plan - 11/14/20 0830    Clinical Impression Statement Pt is progressing slowly towards goals.  Pt reports decr pain overall however she still has pain at night and with functional movements.    OT Occupational Profile and History Problem Focused Assessment - Including review of records  relating to presenting problem    Occupational performance deficits (Please refer to evaluation for details): ADL's;IADL's;Work;Leisure;Rest and Sleep    Body Structure / Function / Physical Skills ADL;Strength;UE functional use;Decreased knowledge of use of DME;Decreased knowledge of precautions;Pain;Body mechanics;IADL    Comorbidities Affecting Occupational Performance: None    Modification or Assistance to Complete Evaluation  No modification of tasks or assist necessary to complete eval    OT Frequency 2x / week    OT Duration 8 weeks    OT Treatment/Interventions Self-care/ADL training;Moist Heat;Fluidtherapy;DME and/or AE instruction;Splinting;Therapeutic activities;Contrast Bath;Ultrasound;Therapeutic exercise;Passive range of motion;Iontophoresis;Cryotherapy;Electrical Stimulation;Paraffin;Manual Therapy;Patient/family education    Plan continue with massage, Ultrasound, stretching; check goals next week and anticipate renewal    Consulted and Agree with Plan of Care Patient           Patient will benefit from skilled therapeutic intervention in order to improve the following deficits and impairments:   Body Structure / Function / Physical Skills: ADL,Strength,UE functional use,Decreased knowledge of use of DME,Decreased knowledge of precautions,Pain,Body mechanics,IADL       Visit Diagnosis: Pain in left elbow  Muscle weakness (generalized)    Problem List Patient Active Problem List   Diagnosis Date Noted  . Migraine 11/01/2020  . Vitamin D deficiency 07/14/2018  . Prediabetes 07/14/2018  . NAFLD (nonalcoholic fatty liver disease) 07/14/2018  . Erythema nodosum 02/13/2013  . Acute transverse myelitis ([place]) 02/13/2013    [facility] 11/14/2020, 8:32 AM  [facility] [address] [place], [place], [address] Phone: [phone]   Fax:  [phone]  Name: Heather Moreno MRN: [id number] Date of Birth:  1971/10/01   Vianne Bulls, OTR/L [facility] [address] [place], [place]  [address] [phone] phone [phone] 11/14/20 8:34 AM

## 2020-11-18 ENCOUNTER — Other Ambulatory Visit: Payer: Self-pay

## 2020-11-18 ENCOUNTER — Ambulatory Visit: Payer: 59 | Admitting: Occupational Therapy

## 2020-11-18 ENCOUNTER — Ambulatory Visit: Payer: 59 | Admitting: Physical Therapy

## 2020-11-18 ENCOUNTER — Encounter: Payer: Self-pay | Admitting: Physical Therapy

## 2020-11-18 DIAGNOSIS — M25522 Pain in left elbow: Secondary | ICD-10-CM

## 2020-11-18 DIAGNOSIS — M6281 Muscle weakness (generalized): Secondary | ICD-10-CM

## 2020-11-18 NOTE — Therapy (Signed)
[facility] [address] [place], [place], [address] Phone: [phone]   Fax:  [phone]  Occupational Therapy Treatment  Patient Details  Name: Heather Moreno MRN: [id number] Date of Birth: [birth year] Referring Provider (OT): Dr. Dennard Nip   Encounter Date: 11/18/2020   OT End of Session - 11/18/20 1441    Visit Number 15    Number of Visits 17    Date for OT Re-Evaluation 11/20/20    Authorization Type [employer] 60 visit limit combined OT/PT, covered 100% after deductible met    Authorization Time Period week 8/8    OT Start Time 0720    OT Stop Time 0800    OT Time Calculation (min) 40 min    Activity Tolerance Patient tolerated treatment well    Behavior During Therapy [facility] for tasks assessed/performed           Past Medical History:  Diagnosis Date  . Allergic rhinitis   . Complication of anesthesia    difficulty with weakness after spinal  . Environmental allergies   . GERD (gastroesophageal reflux disease)   . Headache(784.0)   . HLD (hyperlipidemia)   . IBS (irritable bowel syndrome)   . Lactose intolerance   . Migraine headache with aura   . Vitamin D deficiency     Past Surgical History:  Procedure Laterality Date  . cesarean sections  2009, 2003  . NASAL SINUS SURGERY Bilateral 1989    There were no vitals filed for this visit.   Subjective Assessment - 11/18/20 1316    Subjective  Pt reports that it is really stiff and hurts worse in the am (6/10 pain) and gets better after "warms up".  It woke her up last night.    Pertinent History L tennis elbow/lateral elbow pain.        PMH:  hyperlipidemia, Vitamin D Deficiency, hx of headaches/migraine, hx of transverse myelitis    Patient Stated Goals improve pain    Currently in Pain? Yes    Pain Score 3     Pain Location Elbow    Pain Orientation Left    Pain Descriptors / Indicators Aching    Pain Type Acute pain    Pain Onset More than  a month ago    Pain Frequency Intermittent    Aggravating Factors  bending elbow, twisting forearm, in morning    Pain Relieving Factors rest, heat              Ultrasound x 58mn to L lateral elbow/dorsal forarm 131m, continuous, 1.0wts/cm2 with no adverse reactions.  Soft tissue massage to L lateral/dorsal forearm  Lateral epicondylitis phase I AROM exercises #3-4 and phase II PROM exercises #3-4 x10 each.         OT Education - 11/18/20 1437    Education Details PROM elbow flex; recommended pt try extending LUE on pillow    Person(s) Educated Patient    Methods Explanation;Handout;Demonstration;Verbal cues    Comprehension Verbalized understanding;Returned demonstration            OT Short Term Goals - 11/06/20 0743      OT SHORT TERM GOAL #1   Title Pt will be independent with stretching HEP.--check STGs 10/16/20    Time 4    Period Weeks    Status Achieved      OT SHORT TERM GOAL #2   Title Pt will be independent with activity modifications/improved positioning for ADLs and IADLs to decr pain.  Time 4    Period Weeks    Status On-going      OT SHORT TERM GOAL #3   Title Pt will be indepedent with splint and counterforce band wear/care.    Time 4    Period Weeks    Status Achieved   verbalizes understanding     OT SHORT TERM GOAL #4   Title Pt will report pain consistently less than or equal to 3/10 for light ADLs/IADLs.    Time 4    Period Weeks    Status On-going   up to 4/10 at times            OT Long Term Goals - 11/18/20 1440      OT LONG TERM GOAL #1   Title Pt will be indepedent with strengthening HEP.    Time 8    Period Weeks    Status On-going   11/14/20:  unable to progress to strengthening yet due to pain     OT LONG TERM GOAL #2   Title Pt will be able to lift dishes with LUE without pain.    Time 8    Period Weeks    Status New      OT LONG TERM GOAL #3   Title Pt will be able to perform supination with elbow extended  to assist with patient evalutions without pain.    Time 8    Period Weeks    Status On-going      OT LONG TERM GOAL #4   Title Pt will report pain consistently less than or equal to 1/10 for typical ADLs/IADLs including sleep.    Time 8    Period Weeks    Status On-going      OT LONG TERM GOAL #5   Title Pt will demo at least 45lbs L grip strength for ADLs/IADLs.    Baseline R-24lbs, L-60lbs    Time 8    Period Weeks    Status New                 Plan - 11/18/20 1438    Clinical Impression Statement Pt is progressing slowly towards goals.  Pt reports decr pain overall however she still has pain at night and with functional movements.  L elbow continues to be stiff initially in morning with decr ROM in elbow flexion.    OT Occupational Profile and History Problem Focused Assessment - Including review of records relating to presenting problem    Occupational performance deficits (Please refer to evaluation for details): ADL's;IADL's;Work;Leisure;Rest and Sleep    Body Structure / Function / Physical Skills ADL;Strength;UE functional use;Decreased knowledge of use of DME;Decreased knowledge of precautions;Pain;Body mechanics;IADL    Comorbidities Affecting Occupational Performance: None    Modification or Assistance to Complete Evaluation  No modification of tasks or assist necessary to complete eval    OT Frequency 2x / week    OT Duration 8 weeks    OT Treatment/Interventions Self-care/ADL training;Moist Heat;Fluidtherapy;DME and/or AE instruction;Splinting;Therapeutic activities;Contrast Bath;Ultrasound;Therapeutic exercise;Passive range of motion;Iontophoresis;Cryotherapy;Electrical Stimulation;Paraffin;Manual Therapy;Patient/family education    Plan continue with massage, Ultrasound, stretching; check goals and anticipate renewal    Consulted and Agree with Plan of Care Patient           Patient will benefit from skilled therapeutic intervention in order to improve the  following deficits and impairments:   Body Structure / Function / Physical Skills: ADL,Strength,UE functional use,Decreased knowledge of use of DME,Decreased knowledge of precautions,Pain,Body mechanics,IADL  Visit Diagnosis: Pain in left elbow  Muscle weakness (generalized)    Problem List Patient Active Problem List   Diagnosis Date Noted  . Migraine 11/01/2020  . Vitamin D deficiency 07/14/2018  . Prediabetes 07/14/2018  . NAFLD (nonalcoholic fatty liver disease) 07/14/2018  . Erythema nodosum 02/13/2013  . Acute transverse myelitis ([place]) 02/13/2013    [facility] 11/18/2020, 2:41 PM  [facility] [address] [place], [place], [address] Phone: [phone]   Fax:  [phone]  Name: Heather Moreno MRN: [id number] Date of Birth: 1971-09-09   Vianne Bulls, OTR/L [facility] [address] [place], [place]  [address] [phone] phone [phone] 11/18/20 2:42 PM

## 2020-11-18 NOTE — Patient Instructions (Signed)
° °  Flexion (Passive)    Use other hand to bend elbow, with palm toward same shoulder. Gentle stretch. Hold 10 seconds. Repeat 10  times. Do 2-3 sessions per day.

## 2020-11-18 NOTE — Therapy (Signed)
[facility] [address] [place], [place], [address] Phone: [phone]   Fax:  [phone]  Physical Therapy Treatment  Patient Details  Name: Heather Moreno MRN: [id number] Date of Birth: 01/12/1971 Referring Provider (PT): Rodolph Bong, MD   Encounter Date: 11/18/2020   PT End of Session - 11/18/20 1048    Visit Number 2    Number of Visits 7    Date for PT Re-Evaluation 12/26/20    Authorization Type [employer]et[employer]    PT Start Time 0805    PT Stop Time 0848    PT Time Calculation (min) 43 min    Equipment Utilized During Treatment Other (comment)   dry needles   Activity Tolerance Patient tolerated treatment well    Behavior During Therapy [facility] for tasks assessed/performed           Past Medical History:  Diagnosis Date  . Allergic rhinitis   . Complication of anesthesia    difficulty with weakness after spinal  . Environmental allergies   . GERD (gastroesophageal reflux disease)   . Headache(784.0)   . HLD (hyperlipidemia)   . IBS (irritable bowel syndrome)   . Lactose intolerance   . Migraine headache with aura   . Vitamin D deficiency     Past Surgical History:  Procedure Laterality Date  . cesarean sections  2009, 2003  . NASAL SINUS SURGERY Bilateral 1989    There were no vitals filed for this visit.   Subjective Assessment - 11/18/20 0809    Subjective Was sore after previous session and then symptoms returned to baseline. No significant difference.  More stiff this morning, thinks the cold may make it a little worse.  Is going to try to to sleep with a pillow under her arm for better positioning.  May try to use heat in the morning to help with the tightness.    Pertinent History allergic rhinitis, GERD, migraine, HLD, IBS, lactose intolerance, vitamin D deficiency    Patient Stated Goals Sleep and wake up with less pain (not sleeping well, sweating, restless, Menopause?)    Currently in Pain? Yes     Pain Score 3     Pain Orientation Left    Pain Onset 1 to 4 weeks ago                             [facility] Adult PT Treatment/Exercise - 11/18/20 1043      Manual Therapy   Manual Therapy Soft tissue mobilization;Passive ROM;Taping    Manual therapy comments performed to L forearm-wrist/finger extensor group after TDN    Soft tissue mobilization STM to L wrist and finger extensor muscles after TDN along the length of the muscle    Passive ROM PROM into pronation and wrist flexion after performing TDN and STM    Kinesiotex Create Space;Inhibit Muscle      Kinesiotix   Create Space educated pt on purpose and method of kinesiotaping, wear x 3 days and that pt could shower and use splints with tape applied.  Avoided lateral epicondyle so pt could continue to use patch    Inhibit Muscle  relaxation of L wrist and finger extensor muscle group            Trigger Point Dry Needling - 11/18/20 1046    Consent Given? Yes    Education Handout Provided Previously provided    Muscles Treated Wrist/Hand Extensor carpi radialis  longus/brevis;Extensor digitorum;Extensor carpi ulnaris    Extensor carpi radialis longus/brevis Response Twitch response elicited;Palpable increased muscle length    Extensor digitorum Response Twitch response elicited;Palpable increased muscle length    Extensor carpi ulnaris Response Twitch response elicited;Palpable increased muscle length                PT Education - 11/18/20 1047    Education Details kinesiotaping, discussed plan for visits, encouraged pt to return to walking (without weights around wrists) for aerobic conditioning and benefits of aerobic conditioning on pain    Person(s) Educated Patient    Methods Explanation    Comprehension Verbalized understanding               PT Long Term Goals - 11/11/20 1009      PT LONG TERM GOAL #1   Title Pt will report 50% improvement in painful symptoms after a full night's sleep  and after a full work day.    Time 6    Period Weeks    Status New    Target Date 12/26/20      PT LONG TERM GOAL #2   Title Pt will receive pain neuroscience education and will begin to identify ways to reduce catastrophizing thoughts and use of pain acknowledgement scale with activities    Time 6    Period Weeks    Status New    Target Date 12/26/20      PT LONG TERM GOAL #3   Title Pt will report return to walking program or use of stationary bike for aerobic conditioning    Time 6    Period Weeks    Status New    Target Date 12/26/20      PT LONG TERM GOAL #4   Title Due to decreased pain and improved ability to participate in OT strengthening HEP - pt will demonstrate 10-15lb increase in L hand grip    Baseline 30 lb L, 70 lb R    Time 6    Period Weeks    Status New    Target Date 12/26/20                 Plan - 11/18/20 1050    Clinical Impression Statement Continued to address myofascial pain and trigger points in L wrist/finger extensor muscle group with trigger point dry needling, manual therapy and added in kinesiotaping in applied for muscle inhibition and to create space.  Pt to wear kinesiotape until next OT session.  Will continue to address and progress towards LTG.    Personal Factors and Comorbidities Comorbidity 3+;Fitness;Profession;Time since onset of injury/illness/exacerbation    Comorbidities allergic rhinitis, GERD, migraine, HLD, IBS, lactose intolerance, vitamin D deficiency    Examination-Activity Limitations Carry;Lift;Sleep    Examination-Participation Restrictions Cleaning;Occupation;Meal Prep    Stability/Clinical Decision Making Evolving/Moderate complexity    Rehab Potential Good    PT Frequency 1x / week    PT Duration 6 weeks    PT Treatment/Interventions ADLs/Self Care Home Management;Cryotherapy;Electrical Stimulation;Moist Heat;Therapeutic activities;Therapeutic exercise;Neuromuscular re-education;Patient/family education;Manual  techniques;Passive range of motion;Dry needling;Taping    PT Next Visit Plan PNE/mirror, how was response TDN, taping?  Walking or bike for aerobic - has she resumed walking?    Consulted and Agree with Plan of Care Patient           Patient will benefit from skilled therapeutic intervention in order to improve the following deficits and impairments:  Decreased activity tolerance,Decreased strength,Increased fascial restricitons,Impaired perceived functional ability,Impaired sensation,Impaired  UE functional use,Pain  Visit Diagnosis: Pain in left elbow  Muscle weakness (generalized)     Problem List Patient Active Problem List   Diagnosis Date Noted  . Migraine 11/01/2020  . Vitamin D deficiency 07/14/2018  . Prediabetes 07/14/2018  . NAFLD (nonalcoholic fatty liver disease) 94/49/6759  . Erythema nodosum 02/13/2013  . Acute transverse myelitis ([facility]) 02/13/2013    Dierdre Highman, PT, DPT 11/18/20    10:53 AM     [facility] [address] [place], [place], [address] Phone: [phone]   Fax:  [phone]  Name: Heather Moreno MRN: [id number] Date of Birth: 1971/08/04

## 2020-11-20 ENCOUNTER — Ambulatory Visit: Payer: 59 | Admitting: Occupational Therapy

## 2020-11-20 ENCOUNTER — Other Ambulatory Visit: Payer: Self-pay

## 2020-11-20 DIAGNOSIS — M25522 Pain in left elbow: Secondary | ICD-10-CM | POA: Diagnosis not present

## 2020-11-20 DIAGNOSIS — M6281 Muscle weakness (generalized): Secondary | ICD-10-CM

## 2020-11-20 NOTE — Therapy (Signed)
[facility] [address] [place], [place], [address] Phone: [phone]   Fax:  [phone]  Occupational Therapy Treatment  Patient Details  Name: Heather Moreno MRN: [id number] Date of Birth: [birth year] Referring Provider (OT): Dr. Dennard Nip   Encounter Date: 11/20/2020   OT End of Session - 11/20/20 1418    Visit Number 16    Number of Visits 26   16+10   Date for OT Re-Evaluation 12/27/20    Authorization Type [employer] 60 visit limit combined OT/PT, covered 100% after deductible met    Authorization Time Period renewal completed 11/20/20    OT Start Time 0719    OT Stop Time 0800    OT Time Calculation (min) 41 min    Activity Tolerance Patient tolerated treatment well    Behavior During Therapy [facility] for tasks assessed/performed           Past Medical History:  Diagnosis Date  . Allergic rhinitis   . Complication of anesthesia    difficulty with weakness after spinal  . Environmental allergies   . GERD (gastroesophageal reflux disease)   . Headache(784.0)   . HLD (hyperlipidemia)   . IBS (irritable bowel syndrome)   . Lactose intolerance   . Migraine headache with aura   . Vitamin D deficiency     Past Surgical History:  Procedure Laterality Date  . cesarean sections  2009, 2003  . NASAL SINUS SURGERY Bilateral 1989    There were no vitals filed for this visit.   Subjective Assessment - 11/20/20 0750    Subjective  Pt reports 5/10 pain this am but improved now.  Less pain last few days.  Pt reports that she is trying to modify LUE positioning at night.    Pertinent History L tennis elbow/lateral elbow pain.        PMH:  hyperlipidemia, Vitamin D Deficiency, hx of headaches/migraine, hx of transverse myelitis    Patient Stated Goals improve pain    Currently in Pain? Yes    Pain Score 3     Pain Location Elbow    Pain Orientation Left    Pain Descriptors / Indicators Aching    Pain Onset More  than a month ago    Pain Frequency Intermittent    Aggravating Factors  bending elbow, twisting forearm, in morning    Pain Relieving Factors rest, heat              Ultrasound x 60mn to L lateral elbow/dorsal forarm 153m, continuous, 1.0wts/cm2 with no adverse reactions.  Soft tissue massage to L lateral/dorsal forearm  Lateral epicondylitis phase I AROM exercises #4 and phase II PROM exercises #3-4 x10 each.  Gentle PROM elbow flex x10 each.        OT Short Term Goals - 11/20/20 1426      OT SHORT TERM GOAL #1   Title Pt will be independent with stretching HEP.--check STGs 10/16/20    Time 4    Period Weeks    Status Achieved      OT SHORT TERM GOAL #2   Title Pt will be independent with activity modifications/improved positioning for ADLs and IADLs to decr pain.    Time 4    Period Weeks    Status Achieved      OT SHORT TERM GOAL #3   Title Pt will be indepedent with splint and counterforce band wear/care.    Time 4    Period Weeks  Status Achieved   verbalizes understanding     OT SHORT TERM GOAL #4   Title Pt will report pain consistently less than or equal to 3/10 for light ADLs/IADLs.    Time 4    Period Weeks    Status On-going   up to 4/10 at times.  11/20/20:  not consistent yet            OT Long Term Goals - 11/20/20 1427      OT LONG TERM GOAL #1   Title Pt will be indepedent with strengthening HEP.    Time 8    Period Weeks    Status On-going   11/14/20:  unable to progress to strengthening yet due to pain     OT LONG TERM GOAL #2   Title Pt will be able to lift dishes with LUE without pain.    Time 8    Period Weeks    Status On-going   11/20/20:  unable     OT LONG TERM GOAL #3   Title Pt will be able to perform supination with elbow extended to assist with patient evalutions without pain.    Time 8    Period Weeks    Status On-going   11/20/20:  not consistent     OT LONG TERM GOAL #4   Title Pt will report pain consistently  less than or equal to 1/10 for typical ADLs/IADLs including sleep.    Time 8    Period Weeks    Status On-going      OT LONG TERM GOAL #5   Title Pt will demo at least 40lbs L grip strength for ADLs/IADLs.    Baseline L-24lbs, R-60lbs    Time 8    Period Weeks    Status Revised   11/11/20:  24lbs                Plan - 11/20/20 1421    Clinical Impression Statement Pt continues to progress slowly towards goals with reports of overall decr pain and less sharp/burning pain than initially.  Although improved, milder, less frequent pain persists.  Pt is also receiving PT for dry needling and using nitroglycerin patch.  Therefore, pt would benefit from additional occupational therapy to continue to address pain, weakness, and decr LUE functional use with additional interventions.    OT Occupational Profile and History Problem Focused Assessment - Including review of records relating to presenting problem    Occupational performance deficits (Please refer to evaluation for details): ADL's;IADL's;Work;Leisure;Rest and Sleep    Body Structure / Function / Physical Skills ADL;Strength;UE functional use;Decreased knowledge of use of DME;Decreased knowledge of precautions;Pain;Body mechanics;IADL    Comorbidities Affecting Occupational Performance: None    Modification or Assistance to Complete Evaluation  No modification of tasks or assist necessary to complete eval    OT Frequency 2x / week    OT Duration --   5wks, or 10 visits   OT Treatment/Interventions Self-care/ADL training;Moist Heat;Fluidtherapy;DME and/or AE instruction;Splinting;Therapeutic activities;Contrast Bath;Ultrasound;Therapeutic exercise;Passive range of motion;Iontophoresis;Cryotherapy;Electrical Stimulation;Paraffin;Manual Therapy;Patient/family education    Plan continue towards unmet goals; massage, Ultrasound, stretching.  Renewal completed 11/20/20.    Consulted and Agree with Plan of Care Patient            Patient will benefit from skilled therapeutic intervention in order to improve the following deficits and impairments:   Body Structure / Function / Physical Skills: ADL,Strength,UE functional use,Decreased knowledge of use of DME,Decreased knowledge of precautions,Pain,Body mechanics,IADL  Visit Diagnosis: Pain in left elbow  Muscle weakness (generalized)    Problem List Patient Active Problem List   Diagnosis Date Noted  . Migraine 11/01/2020  . Vitamin D deficiency 07/14/2018  . Prediabetes 07/14/2018  . NAFLD (nonalcoholic fatty liver disease) 07/14/2018  . Erythema nodosum 02/13/2013  . Acute transverse myelitis ([place]) 02/13/2013    [facility] 11/20/2020, 2:32 PM  [facility] [address] [place], [place], [address] Phone: [phone]   Fax:  [phone]  Name: Heather Moreno MRN: [id number] Date of Birth: 25-Aug-1971

## 2020-11-23 ENCOUNTER — Other Ambulatory Visit (INDEPENDENT_AMBULATORY_CARE_PROVIDER_SITE_OTHER): Payer: Self-pay | Admitting: Family Medicine

## 2020-11-23 DIAGNOSIS — R7303 Prediabetes: Secondary | ICD-10-CM

## 2020-11-23 DIAGNOSIS — E7849 Other hyperlipidemia: Secondary | ICD-10-CM

## 2020-11-25 ENCOUNTER — Ambulatory Visit: Payer: 59 | Admitting: Occupational Therapy

## 2020-11-25 ENCOUNTER — Other Ambulatory Visit: Payer: Self-pay

## 2020-11-25 ENCOUNTER — Encounter: Payer: Self-pay | Admitting: Occupational Therapy

## 2020-11-25 DIAGNOSIS — M6281 Muscle weakness (generalized): Secondary | ICD-10-CM

## 2020-11-25 DIAGNOSIS — M25522 Pain in left elbow: Secondary | ICD-10-CM

## 2020-11-25 NOTE — Therapy (Signed)
[facility] [address] [place], [place], [address] Phone: [phone]   Fax:  [phone]  Occupational Therapy Treatment  Patient Details  Name: Heather Moreno MRN: [id number] Date of Birth: 20-May-1971 Referring Provider (OT): Dr. Dennard Nip   Encounter Date: 11/25/2020   OT End of Session - 11/25/20 0802    Visit Number 17    Number of Visits 26   16+10   Date for OT Re-Evaluation 12/27/20    Authorization Type [employer] 60 visit limit combined OT/PT, covered 100% after deductible met    Authorization Time Period renewal completed 11/20/20    OT Start Time 0719    OT Stop Time 0800    OT Time Calculation (min) 41 min    Activity Tolerance Patient tolerated treatment well    Behavior During Therapy [facility] for tasks assessed/performed           Past Medical History:  Diagnosis Date  . Allergic rhinitis   . Complication of anesthesia    difficulty with weakness after spinal  . Environmental allergies   . GERD (gastroesophageal reflux disease)   . Headache(784.0)   . HLD (hyperlipidemia)   . IBS (irritable bowel syndrome)   . Lactose intolerance   . Migraine headache with aura   . Vitamin D deficiency     Past Surgical History:  Procedure Laterality Date  . cesarean sections  2009, 2003  . NASAL SINUS SURGERY Bilateral 1989    There were no vitals filed for this visit.   Subjective Assessment - 11/25/20 0748    Subjective  Pt reports 5/10 pain this am but improved now.  Really sore yesterday.  Wasn't very active last week, but may be holding arm in guarding pattern.    Pertinent History L tennis elbow/lateral elbow pain.        PMH:  hyperlipidemia, Vitamin D Deficiency, hx of headaches/migraine, hx of transverse myelitis    Patient Stated Goals improve pain    Currently in Pain? Yes    Pain Score 3     Pain Location Elbow    Pain Orientation Left    Pain Descriptors / Indicators Aching    Pain Type  Acute pain    Pain Onset More than a month ago    Pain Frequency Intermittent    Aggravating Factors  fully bending/straightening elbow    Pain Relieving Factors rest, heat, massage                Ultrasound x 49mn to L lateral elbow/dorsal forarm 144m, continuous, 1.0wts/cm2 with no adverse reactions.  Soft tissue massage to L lateral/dorsal forearm  Lateral epicondylitis phase e II PROM exercises #3-4.  Gentle AROM elbow flex/ext x10 each and table slides with elbow flex/ext.             OT Education - 11/25/20 0809    Education Details Discussed positioning while sitting and recommended pt frequently perform AROM elbow flex and ext throughout the day.  Also recommended pt wear wrist brace due to tendency to flex wrist with elbow flex and recommended using perscribed medication more consistently.  Encouraged pt to try to walk for general conditioning    Person(s) Educated Patient    Methods Explanation;Demonstration    Comprehension Verbalized understanding;Returned demonstration            OT Short Term Goals - 11/20/20 1426      OT SHORT TERM GOAL #1   Title Pt will be  independent with stretching HEP.--check STGs 10/16/20    Time 4    Period Weeks    Status Achieved      OT SHORT TERM GOAL #2   Title Pt will be independent with activity modifications/improved positioning for ADLs and IADLs to decr pain.    Time 4    Period Weeks    Status Achieved      OT SHORT TERM GOAL #3   Title Pt will be indepedent with splint and counterforce band wear/care.    Time 4    Period Weeks    Status Achieved   verbalizes understanding     OT SHORT TERM GOAL #4   Title Pt will report pain consistently less than or equal to 3/10 for light ADLs/IADLs.    Time 4    Period Weeks    Status On-going   up to 4/10 at times.  11/20/20:  not consistent yet            OT Long Term Goals - 11/20/20 1427      OT LONG TERM GOAL #1   Title Pt will be indepedent with  strengthening HEP.    Time 8    Period Weeks    Status On-going   11/14/20:  unable to progress to strengthening yet due to pain     OT LONG TERM GOAL #2   Title Pt will be able to lift dishes with LUE without pain.    Time 8    Period Weeks    Status On-going   11/20/20:  unable     OT LONG TERM GOAL #3   Title Pt will be able to perform supination with elbow extended to assist with patient evalutions without pain.    Time 8    Period Weeks    Status On-going   11/20/20:  not consistent     OT LONG TERM GOAL #4   Title Pt will report pain consistently less than or equal to 1/10 for typical ADLs/IADLs including sleep.    Time 8    Period Weeks    Status On-going      OT LONG TERM GOAL #5   Title Pt will demo at least 40lbs L grip strength for ADLs/IADLs.    Baseline L-24lbs, R-60lbs    Time 8    Period Weeks    Status Revised   11/11/20:  24lbs                Plan - 11/25/20 0810    Clinical Impression Statement Pt continues to report pain with transitional movements elbow flex>ext and elbow ext>flexion as well as with combined wrist flex.   Recommended pt focus on positioning and try to be more consistent with splint wear and use of medication.    OT Occupational Profile and History Problem Focused Assessment - Including review of records relating to presenting problem    Occupational performance deficits (Please refer to evaluation for details): ADL's;IADL's;Work;Leisure;Rest and Sleep    Body Structure / Function / Physical Skills ADL;Strength;UE functional use;Decreased knowledge of use of DME;Decreased knowledge of precautions;Pain;Body mechanics;IADL    Comorbidities Affecting Occupational Performance: None    Modification or Assistance to Complete Evaluation  No modification of tasks or assist necessary to complete eval    OT Frequency 2x / week    OT Duration --   5wks, or 10 visits   OT Treatment/Interventions Self-care/ADL training;Moist Heat;Fluidtherapy;DME  and/or AE instruction;Splinting;Therapeutic activities;Contrast Bath;Ultrasound;Therapeutic exercise;Passive range of motion;Iontophoresis;Cryotherapy;Dealer  Stimulation;Paraffin;Manual Therapy;Patient/family education    Plan continue towards unmet goals; massage, Ultrasound, stretching.    Consulted and Agree with Plan of Care Patient           Patient will benefit from skilled therapeutic intervention in order to improve the following deficits and impairments:   Body Structure / Function / Physical Skills: ADL,Strength,UE functional use,Decreased knowledge of use of DME,Decreased knowledge of precautions,Pain,Body mechanics,IADL       Visit Diagnosis: Pain in left elbow  Muscle weakness (generalized)    Problem List Patient Active Problem List   Diagnosis Date Noted  . Migraine 11/01/2020  . Vitamin D deficiency 07/14/2018  . Prediabetes 07/14/2018  . NAFLD (nonalcoholic fatty liver disease) 07/14/2018  . Erythema nodosum 02/13/2013  . Acute transverse myelitis ([place]) 02/13/2013    [facility] 11/25/2020, 8:23 AM  [facility] [address] [place], [place], [address] Phone: [phone]   Fax:  [phone]  Name: Heather Moreno MRN: [id number] Date of Birth: Dec 01, 1970   Vianne Bulls, OTR/L [facility] [address] [place], [place]  [address] [phone] phone [phone] 11/25/20 8:25 AM

## 2020-11-28 ENCOUNTER — Ambulatory Visit: Payer: 59 | Admitting: Occupational Therapy

## 2020-11-28 ENCOUNTER — Other Ambulatory Visit: Payer: Self-pay

## 2020-11-28 DIAGNOSIS — M6281 Muscle weakness (generalized): Secondary | ICD-10-CM

## 2020-11-28 DIAGNOSIS — M25522 Pain in left elbow: Secondary | ICD-10-CM | POA: Diagnosis not present

## 2020-11-28 NOTE — Therapy (Signed)
[facility] [address] [place], [place], [address] Phone: [phone]   Fax:  [phone]  Occupational Therapy Treatment  Patient Details  Name: Heather Moreno MRN: [id number] Date of Birth: January 21, 1971 Referring Provider (OT): Dr. Dennard Nip   Encounter Date: 11/28/2020   OT End of Session - 11/28/20 0811    Visit Number 18    Number of Visits 26   16+10   Date for OT Re-Evaluation 12/27/20    Authorization Type [employer] 60 visit limit combined OT/PT, covered 100% after deductible met    Authorization Time Period renewal completed 11/20/20    OT Start Time 0721    OT Stop Time 0800    OT Time Calculation (min) 39 min    Activity Tolerance Patient tolerated treatment well    Behavior During Therapy [facility] for tasks assessed/performed           Past Medical History:  Diagnosis Date  . Allergic rhinitis   . Complication of anesthesia    difficulty with weakness after spinal  . Environmental allergies   . GERD (gastroesophageal reflux disease)   . Headache(784.0)   . HLD (hyperlipidemia)   . IBS (irritable bowel syndrome)   . Lactose intolerance   . Migraine headache with aura   . Vitamin D deficiency     Past Surgical History:  Procedure Laterality Date  . cesarean sections  2009, 2003  . NASAL SINUS SURGERY Bilateral 1989    There were no vitals filed for this visit.   Subjective Assessment - 11/28/20 0722    Subjective  Pt reports that pain is a little better.    Pertinent History L tennis elbow/lateral elbow pain.        PMH:  hyperlipidemia, Vitamin D Deficiency, hx of headaches/migraine, hx of transverse myelitis    Patient Stated Goals improve pain    Currently in Pain? Yes    Pain Score 3     Pain Location Elbow    Pain Orientation Left    Pain Descriptors / Indicators Aching    Pain Type Acute pain    Pain Onset More than a month ago    Pain Frequency Intermittent    Aggravating Factors   fully bending/straightening elbow    Pain Relieving Factors rest, heat, massage               Ultrasound x 14mn to L lateral elbow/dorsal forarm 156m, continuous, 1.0wts/cm2 with no adverse reactions.  Soft tissue massage to L lateral/dorsal forearm  Lateral epicondylitis phase II PROM exercises #3-4.  Gentle AROM elbow flex/ext.    Kinesiotape applied to incr circulation and for edema and tightness (single localized strip at lateral elbow.  Reviewed wear/care and precautions.    Discussed possible decr in frequency or hold OT due to slow progress and concerns regarding insurance coverage of therapy.  Pt will check insurance coverage prior to changing frequency.           OT Short Term Goals - 11/20/20 1426      OT SHORT TERM GOAL #1   Title Pt will be independent with stretching HEP.--check STGs 10/16/20    Time 4    Period Weeks    Status Achieved      OT SHORT TERM GOAL #2   Title Pt will be independent with activity modifications/improved positioning for ADLs and IADLs to decr pain.    Time 4    Period Weeks    Status Achieved  OT SHORT TERM GOAL #3   Title Pt will be indepedent with splint and counterforce band wear/care.    Time 4    Period Weeks    Status Achieved   verbalizes understanding     OT SHORT TERM GOAL #4   Title Pt will report pain consistently less than or equal to 3/10 for light ADLs/IADLs.    Time 4    Period Weeks    Status On-going   up to 4/10 at times.  11/20/20:  not consistent yet            OT Long Term Goals - 11/20/20 1427      OT LONG TERM GOAL #1   Title Pt will be indepedent with strengthening HEP.    Time 8    Period Weeks    Status On-going   11/14/20:  unable to progress to strengthening yet due to pain     OT LONG TERM GOAL #2   Title Pt will be able to lift dishes with LUE without pain.    Time 8    Period Weeks    Status On-going   11/20/20:  unable     OT LONG TERM GOAL #3   Title Pt will be able to  perform supination with elbow extended to assist with patient evalutions without pain.    Time 8    Period Weeks    Status On-going   11/20/20:  not consistent     OT LONG TERM GOAL #4   Title Pt will report pain consistently less than or equal to 1/10 for typical ADLs/IADLs including sleep.    Time 8    Period Weeks    Status On-going      OT LONG TERM GOAL #5   Title Pt will demo at least 40lbs L grip strength for ADLs/IADLs.    Baseline L-24lbs, R-60lbs    Time 8    Period Weeks    Status Revised   11/11/20:  24lbs                Plan - 11/28/20 0812    Clinical Impression Statement Pt continues to report pain with transitional movements elbow flex>ext and elbow ext>flexion as well as with combined wrist flex.   Recommended pt focus on positioning and try to be more consistent with splint wear and use of medication.    OT Occupational Profile and History Problem Focused Assessment - Including review of records relating to presenting problem    Occupational performance deficits (Please refer to evaluation for details): ADL's;IADL's;Work;Leisure;Rest and Sleep    Body Structure / Function / Physical Skills ADL;Strength;UE functional use;Decreased knowledge of use of DME;Decreased knowledge of precautions;Pain;Body mechanics;IADL    Comorbidities Affecting Occupational Performance: None    Modification or Assistance to Complete Evaluation  No modification of tasks or assist necessary to complete eval    OT Frequency 2x / week    OT Duration --   5wks, or 10 visits   OT Treatment/Interventions Self-care/ADL training;Moist Heat;Fluidtherapy;DME and/or AE instruction;Splinting;Therapeutic activities;Contrast Bath;Ultrasound;Therapeutic exercise;Passive range of motion;Iontophoresis;Cryotherapy;Electrical Stimulation;Paraffin;Manual Therapy;Patient/family education    Plan continue towards unmet goals; massage, Ultrasound, stretching.    Consulted and Agree with Plan of Care Patient            Patient will benefit from skilled therapeutic intervention in order to improve the following deficits and impairments:   Body Structure / Function / Physical Skills: ADL,Strength,UE functional use,Decreased knowledge of use of DME,Decreased knowledge  of precautions,Pain,Body mechanics,IADL       Visit Diagnosis: Pain in left elbow  Muscle weakness (generalized)    Problem List Patient Active Problem List   Diagnosis Date Noted  . Migraine 11/01/2020  . Vitamin D deficiency 07/14/2018  . Prediabetes 07/14/2018  . NAFLD (nonalcoholic fatty liver disease) 07/14/2018  . Erythema nodosum 02/13/2013  . Acute transverse myelitis ([place]) 02/13/2013    [facility] 11/28/2020, 8:20 AM  [facility] [address] [place], [place], [address] Phone: [phone]   Fax:  [phone]  Name: Heather Moreno MRN: [id number] Date of Birth: 27-Oct-1971   Vianne Bulls, OTR/L [facility] [address] [place], [place]  [address] [phone] phone [phone] 11/28/20 8:20 AM

## 2020-12-02 ENCOUNTER — Ambulatory Visit: Payer: 59 | Attending: Family Medicine | Admitting: Occupational Therapy

## 2020-12-02 ENCOUNTER — Other Ambulatory Visit: Payer: Self-pay

## 2020-12-02 DIAGNOSIS — R2689 Other abnormalities of gait and mobility: Secondary | ICD-10-CM | POA: Insufficient documentation

## 2020-12-02 DIAGNOSIS — M25522 Pain in left elbow: Secondary | ICD-10-CM | POA: Diagnosis present

## 2020-12-02 DIAGNOSIS — M6281 Muscle weakness (generalized): Secondary | ICD-10-CM | POA: Diagnosis present

## 2020-12-02 NOTE — Therapy (Signed)
[facility] [address] [place], [place], [address] Phone: [phone]   Fax:  [phone]  Occupational Therapy Treatment  Patient Details  Name: Heather Moreno MRN: [id number] Date of Birth: Apr 21, 1971 Referring Provider (OT): Dr. Dennard Nip   Encounter Date: 12/02/2020   OT End of Session - 12/02/20 0750    Visit Number 19    Number of Visits 26   23+55   Date for OT Re-Evaluation 12/27/20    Authorization Type [employer] 60 visit limit combined OT/PT, covered 100% after deductible met    Authorization Time Period renewal completed 11/20/20    OT Start Time 0721    OT Stop Time 0803    OT Time Calculation (min) 42 min    Activity Tolerance Patient tolerated treatment well    Behavior During Therapy [facility] for tasks assessed/performed           Past Medical History:  Diagnosis Date  . Allergic rhinitis   . Complication of anesthesia    difficulty with weakness after spinal  . Environmental allergies   . GERD (gastroesophageal reflux disease)   . Headache(784.0)   . HLD (hyperlipidemia)   . IBS (irritable bowel syndrome)   . Lactose intolerance   . Migraine headache with aura   . Vitamin D deficiency     Past Surgical History:  Procedure Laterality Date  . cesarean sections  2009, 2003  . NASAL SINUS SURGERY Bilateral 1989    There were no vitals filed for this visit.   Subjective Assessment - 12/02/20 0749    Subjective  Pt reports that she felt that the tape helped some, it is not waking her up at night    Pertinent History L tennis elbow/lateral elbow pain.        PMH:  hyperlipidemia, Vitamin D Deficiency, hx of headaches/migraine, hx of transverse myelitis    Patient Stated Goals improve pain    Currently in Pain? Yes    Pain Score 3    up to 6/10 with elbow flex   Pain Onset More than a month ago             Ultrasound x 55mn to L lateral elbow/dorsal forarm 364m, continuous, 1.0wts/cm2  with no adverse reactions.  Soft tissue massage to L lateral/dorsal forearm  Interferential Current to L elbow x15 min (low beat _0 , high beat _1 , Carrier freq 4000 Hz, Volts CV 6.4 with no adverse reactions for pain..  Active wrist flex with elbow extended and pronation, AROM elbow flex/ext, Lateral epicondylitis phase II PROM exercise #4 (passive wrist flex with pronation and elbow ext.)  Kinesiotape applied to incr circulation and for edema and tightness (single localized strip at lateral elbow.            OT Short Term Goals - 11/20/20 1426      OT SHORT TERM GOAL #1   Title Pt will be independent with stretching HEP.--check STGs 10/16/20    Time 4    Period Weeks    Status Achieved      OT SHORT TERM GOAL #2   Title Pt will be independent with activity modifications/improved positioning for ADLs and IADLs to decr pain.    Time 4    Period Weeks    Status Achieved      OT SHORT TERM GOAL #3   Title Pt will be indepedent with splint and counterforce band wear/care.    Time 4    Period Weeks  Status Achieved   verbalizes understanding     OT SHORT TERM GOAL #4   Title Pt will report pain consistently less than or equal to 3/10 for light ADLs/IADLs.    Time 4    Period Weeks    Status On-going   up to 4/10 at times.  11/20/20:  not consistent yet            OT Long Term Goals - 11/20/20 1427      OT LONG TERM GOAL #1   Title Pt will be indepedent with strengthening HEP.    Time 8    Period Weeks    Status On-going   11/14/20:  unable to progress to strengthening yet due to pain     OT LONG TERM GOAL #2   Title Pt will be able to lift dishes with LUE without pain.    Time 8    Period Weeks    Status On-going   11/20/20:  unable     OT LONG TERM GOAL #3   Title Pt will be able to perform supination with elbow extended to assist with patient evalutions without pain.    Time 8    Period Weeks    Status On-going   11/20/20:  not consistent     OT  LONG TERM GOAL #4   Title Pt will report pain consistently less than or equal to 1/10 for typical ADLs/IADLs including sleep.    Time 8    Period Weeks    Status On-going      OT LONG TERM GOAL #5   Title Pt will demo at least 40lbs L grip strength for ADLs/IADLs.    Baseline L-24lbs, R-60lbs    Time 8    Period Weeks    Status Revised   11/11/20:  24lbs                Plan - 12/02/20 0750    Clinical Impression Statement Pt continues to report pain with transitional movements elbow flex>ext and elbow ext>flexion as well as with combined wrist flex, but less severe overall.    OT Occupational Profile and History Problem Focused Assessment - Including review of records relating to presenting problem    Occupational performance deficits (Please refer to evaluation for details): ADL's;IADL's;Work;Leisure;Rest and Sleep    Body Structure / Function / Physical Skills ADL;Strength;UE functional use;Decreased knowledge of use of DME;Decreased knowledge of precautions;Pain;Body mechanics;IADL    Comorbidities Affecting Occupational Performance: None    Modification or Assistance to Complete Evaluation  No modification of tasks or assist necessary to complete eval    OT Frequency 2x / week    OT Duration --   5wks, or 10 visits   OT Treatment/Interventions Self-care/ADL training;Moist Heat;Fluidtherapy;DME and/or AE instruction;Splinting;Therapeutic activities;Contrast Bath;Ultrasound;Therapeutic exercise;Passive range of motion;Iontophoresis;Cryotherapy;Electrical Stimulation;Paraffin;Manual Therapy;Patient/family education    Plan continue towards unmet goals; massage, Ultrasound, stretching, Interferential current    Consulted and Agree with Plan of Care Patient           Patient will benefit from skilled therapeutic intervention in order to improve the following deficits and impairments:   Body Structure / Function / Physical Skills: ADL,Strength,UE functional use,Decreased  knowledge of use of DME,Decreased knowledge of precautions,Pain,Body mechanics,IADL       Visit Diagnosis: Pain in left elbow  Muscle weakness (generalized)    Problem List Patient Active Problem List   Diagnosis Date Noted  . Migraine 11/01/2020  . Vitamin D deficiency 07/14/2018  .  Prediabetes 07/14/2018  . NAFLD (nonalcoholic fatty liver disease) 07/14/2018  . Erythema nodosum 02/13/2013  . Acute transverse myelitis ([place]) 02/13/2013    [facility] 12/02/2020, 8:19 AM  [facility] [address] [place], [place], [address] Phone: [phone]   Fax:  [phone]  Name: Mayrin Schmuck MRN: [id number] Date of Birth: 07-Apr-1971   Vianne Bulls, OTR/L [facility] [address] [place], [place]  [address] [phone] phone [phone] 12/02/20 8:19 AM

## 2020-12-05 ENCOUNTER — Other Ambulatory Visit: Payer: Self-pay

## 2020-12-05 ENCOUNTER — Ambulatory Visit: Payer: 59 | Admitting: Occupational Therapy

## 2020-12-05 DIAGNOSIS — M6281 Muscle weakness (generalized): Secondary | ICD-10-CM

## 2020-12-05 DIAGNOSIS — M25522 Pain in left elbow: Secondary | ICD-10-CM

## 2020-12-05 NOTE — Therapy (Signed)
[facility] [address] [place], [place], [address] Phone: [phone]   Fax:  [phone]  Occupational Therapy Treatment  Patient Details  Name: Heather Moreno MRN: [id number] Date of Birth: 1971/02/25 Referring Provider (OT): Dr. Dennard Nip   Encounter Date: 12/05/2020   OT End of Session - 12/05/20 0755    Visit Number 20    Number of Visits 26   16+10   Date for OT Re-Evaluation 12/27/20    Authorization Type [employer] 60 visit limit combined OT/PT, covered 100% after deductible met    Authorization Time Period renewal completed 11/20/20    OT Start Time 0721    OT Stop Time 0803    OT Time Calculation (min) 42 min    Activity Tolerance Patient tolerated treatment well    Behavior During Therapy [facility] for tasks assessed/performed           Past Medical History:  Diagnosis Date  . Allergic rhinitis   . Complication of anesthesia    difficulty with weakness after spinal  . Environmental allergies   . GERD (gastroesophageal reflux disease)   . Headache(784.0)   . HLD (hyperlipidemia)   . IBS (irritable bowel syndrome)   . Lactose intolerance   . Migraine headache with aura   . Vitamin D deficiency     Past Surgical History:  Procedure Laterality Date  . cesarean sections  2009, 2003  . NASAL SINUS SURGERY Bilateral 1989    There were no vitals filed for this visit.   Subjective Assessment - 12/05/20 0753    Subjective  It has felt a little better 3-4/10 pain in the morning at end ranges elbow flex/ext.  It used to be higher.  If I stay within a certain range, I don't notice it.    Pertinent History L tennis elbow/lateral elbow pain.        PMH:  hyperlipidemia, Vitamin D Deficiency, hx of headaches/migraine, hx of transverse myelitis    Patient Stated Goals improve pain    Currently in Pain? Yes    Pain Score 3     Pain Location Elbow    Pain Orientation Left    Pain Descriptors / Indicators Aching     Pain Type Acute pain    Pain Onset More than a month ago    Pain Frequency Intermittent    Aggravating Factors  fully bending/straightening elbow    Pain Relieving Factors rest, heat, massage, kinesiotape             Ultrasound x 51mn to L lateral elbow/dorsal forarm 338m, continuous, 1.0wts/cm2 with no adverse reactions.  Soft tissue massage to L lateral/dorsal forearm  Interferential Current to L elbow x15 min (low beat _0 , high beat _1 , Carrier freq 4000 Hz, Volts CV 7.6 with no adverse reactions for pain.  Lateral epicondylitis phase II PROM exercise #4 (passive wrist flex with pronation and elbow ext.), AROM elbow flex/ext stretch within pain tolerance with forearm and wrist neutral with min cueing (decr pain and improved available ROM with repetition).         OT Short Term Goals - 11/20/20 1426      OT SHORT TERM GOAL #1   Title Pt will be independent with stretching HEP.--check STGs 10/16/20    Time 4    Period Weeks    Status Achieved      OT SHORT TERM GOAL #2   Title Pt will be independent with activity modifications/improved positioning for ADLs  and IADLs to decr pain.    Time 4    Period Weeks    Status Achieved      OT SHORT TERM GOAL #3   Title Pt will be indepedent with splint and counterforce band wear/care.    Time 4    Period Weeks    Status Achieved   verbalizes understanding     OT SHORT TERM GOAL #4   Title Pt will report pain consistently less than or equal to 3/10 for light ADLs/IADLs.    Time 4    Period Weeks    Status On-going   up to 4/10 at times.  11/20/20:  not consistent yet            OT Long Term Goals - 11/20/20 1427      OT LONG TERM GOAL #1   Title Pt will be indepedent with strengthening HEP.    Time 8    Period Weeks    Status On-going   11/14/20:  unable to progress to strengthening yet due to pain     OT LONG TERM GOAL #2   Title Pt will be able to lift dishes with LUE without pain.    Time 8     Period Weeks    Status On-going   11/20/20:  unable     OT LONG TERM GOAL #3   Title Pt will be able to perform supination with elbow extended to assist with patient evalutions without pain.    Time 8    Period Weeks    Status On-going   11/20/20:  not consistent     OT LONG TERM GOAL #4   Title Pt will report pain consistently less than or equal to 1/10 for typical ADLs/IADLs including sleep.    Time 8    Period Weeks    Status On-going      OT LONG TERM GOAL #5   Title Pt will demo at least 40lbs L grip strength for ADLs/IADLs.    Baseline L-24lbs, R-60lbs    Time 8    Period Weeks    Status Revised   11/11/20:  24lbs                Plan - 12/05/20 0755    Clinical Impression Statement Pt continues to report pain with transitional movements elbow flex>ext and elbow ext>flexion as well as with combined wrist flex, but less severe overall and able to do some things that she was unable to with LUE prior.    OT Occupational Profile and History Problem Focused Assessment - Including review of records relating to presenting problem    Occupational performance deficits (Please refer to evaluation for details): ADL's;IADL's;Work;Leisure;Rest and Sleep    Body Structure / Function / Physical Skills ADL;Strength;UE functional use;Decreased knowledge of use of DME;Decreased knowledge of precautions;Pain;Body mechanics;IADL    Comorbidities Affecting Occupational Performance: None    Modification or Assistance to Complete Evaluation  No modification of tasks or assist necessary to complete eval    OT Frequency 2x / week    OT Duration --   5wks, or 10 visits   OT Treatment/Interventions Self-care/ADL training;Moist Heat;Fluidtherapy;DME and/or AE instruction;Splinting;Therapeutic activities;Contrast Bath;Ultrasound;Therapeutic exercise;Passive range of motion;Iontophoresis;Cryotherapy;Electrical Stimulation;Paraffin;Manual Therapy;Patient/family education    Plan continue towards unmet  goals; massage, Ultrasound, stretching, Interferential current    Consulted and Agree with Plan of Care Patient           Patient will benefit from skilled therapeutic intervention in order  to improve the following deficits and impairments:   Body Structure / Function / Physical Skills: ADL,Strength,UE functional use,Decreased knowledge of use of DME,Decreased knowledge of precautions,Pain,Body mechanics,IADL       Visit Diagnosis: Pain in left elbow  Muscle weakness (generalized)    Problem List Patient Active Problem List   Diagnosis Date Noted  . Migraine 11/01/2020  . Vitamin D deficiency 07/14/2018  . Prediabetes 07/14/2018  . NAFLD (nonalcoholic fatty liver disease) 07/14/2018  . Erythema nodosum 02/13/2013  . Acute transverse myelitis ([place]) 02/13/2013    [facility] 12/05/2020, 8:10 AM  [facility] [address] [place], [place], [address] Phone: [phone]   Fax:  [phone]  Name: Heather Moreno MRN: [id number] Date of Birth: November 04, 1971   Vianne Bulls, OTR/L [facility] [address] [place], [place]  [address] [phone] phone [phone] 12/05/20 8:10 AM

## 2020-12-06 ENCOUNTER — Encounter: Payer: Self-pay | Admitting: Physical Therapy

## 2020-12-06 ENCOUNTER — Ambulatory Visit: Payer: 59 | Admitting: Physical Therapy

## 2020-12-06 DIAGNOSIS — M6281 Muscle weakness (generalized): Secondary | ICD-10-CM

## 2020-12-06 DIAGNOSIS — M25522 Pain in left elbow: Secondary | ICD-10-CM | POA: Diagnosis not present

## 2020-12-06 DIAGNOSIS — R2689 Other abnormalities of gait and mobility: Secondary | ICD-10-CM

## 2020-12-06 NOTE — Therapy (Signed)
[facility] [address] [place], [place], [address] Phone: [phone]   Fax:  [phone]  Physical Therapy Treatment  Patient Details  Name: Heather Moreno MRN: [id number] Date of Birth: May 11, 1971 Referring Provider (PT): Gregor Hams, MD   Encounter Date: 12/06/2020   PT End of Session - 12/06/20 0958    Visit Number 3    Number of Visits 7    Date for PT Re-Evaluation 12/26/20    Authorization Type [employer]et[employer]    PT Start Time [phone]    PT Stop Time 0940    PT Time Calculation (min) 48 min    Equipment Utilized During Treatment Other (comment)   dry needles   Activity Tolerance Patient tolerated treatment well    Behavior During Therapy [facility] for tasks assessed/performed           Past Medical History:  Diagnosis Date  . Allergic rhinitis   . Complication of anesthesia    difficulty with weakness after spinal  . Environmental allergies   . GERD (gastroesophageal reflux disease)   . Headache(784.0)   . HLD (hyperlipidemia)   . IBS (irritable bowel syndrome)   . Lactose intolerance   . Migraine headache with aura   . Vitamin D deficiency     Past Surgical History:  Procedure Laterality Date  . cesarean sections  2009, 2003  . NASAL SINUS SURGERY Bilateral 1989    There were no vitals filed for this visit.   Subjective Assessment - 12/06/20 0855    Subjective Pt feels like pain is better overall, not waking up her up at night.  Feels it when she wakes up.  Taping and E-stim seem to be helping.  Did do some walking on Monday but is concerned about exercise induced migraines.    Pertinent History allergic rhinitis, GERD, migraine, HLD, IBS, lactose intolerance, vitamin D deficiency    Patient Stated Goals Sleep and wake up with less pain (not sleeping well, sweating, restless, Menopause?)    Currently in Pain? Yes    Pain Onset More than a month ago                             [facility] Adult  PT Treatment/Exercise - 12/06/20 0951      Therapeutic Activites    Therapeutic Activities Other Therapeutic Activities    Other Therapeutic Activities educated on use of 4 count box breathing when performing exercises (HEP or aerobic) to improve HR recovery and prevent exercise induced migraines and pain response.  Also educated pt on ways to gradually progress exercises at home and monitoring pain threshold while reinforcing message of safety and healing.      Modalities   Modalities Moist Heat      Moist Heat Therapy   Number Minutes Moist Heat 10 Minutes    Moist Heat Location Elbow;Other (comment)   L forearm; while performing active wrist extension and finger flex/ext     Manual Therapy   Manual Therapy Soft tissue mobilization;Taping    Manual therapy comments performed to L forearm-wrist/finger extensor group after TDN along the length of the muscle with manual trigger point release; twitches noted with manual trigger point release    Soft tissue mobilization STM to L wrist and finger extensor muscles after TDN along the length of the muscle    Kinesiotex Inhibit Muscle      Kinesiotix   Inhibit Muscle  applied along  L supinator and wrist/finger extensor muscle group from belly of muscle > lateral epicondyle to facilitate relaxation of muscle group            Trigger Point Dry Needling - 12/06/20 0950    Consent Given? Yes    Education Handout Provided Previously provided    Muscles Treated Wrist/Hand Extensor carpi radialis longus/brevis;Extensor digitorum;Extensor carpi ulnaris    Dry Needling Comments Performed in sitting with LUE supported in pronation    Extensor carpi radialis longus/brevis Response Twitch response elicited;Palpable increased muscle length    Extensor digitorum Response Twitch response elicited;Palpable increased muscle length    Extensor carpi ulnaris Response Twitch response elicited;Palpable increased muscle length                PT  Education - 12/06/20 0957    Education Details 4 count box breathing for HR management, expectations of symptoms when performing exercises and ways to modify while maintaining a mindset of safety    Person(s) Educated Patient    Methods Explanation    Comprehension Verbalized understanding               PT Long Term Goals - 11/11/20 1009      PT LONG TERM GOAL #1   Title Pt will report 50% improvement in painful symptoms after a full night's sleep and after a full work day.    Time 6    Period Weeks    Status New    Target Date 12/26/20      PT LONG TERM GOAL #2   Title Pt will receive pain neuroscience education and will begin to identify ways to reduce catastrophizing thoughts and use of pain acknowledgement scale with activities    Time 6    Period Weeks    Status New    Target Date 12/26/20      PT LONG TERM GOAL #3   Title Pt will report return to walking program or use of stationary bike for aerobic conditioning    Time 6    Period Weeks    Status New    Target Date 12/26/20      PT LONG TERM GOAL #4   Title Due to decreased pain and improved ability to participate in OT strengthening HEP - pt will demonstrate 10-15lb increase in L hand grip    Baseline 30 lb L, 70 lb R    Time 6    Period Weeks    Status New    Target Date 12/26/20                 Plan - 12/06/20 0959    Clinical Impression Statement Patient continues to present with notable painful myofascial trigger points closer to lateral epicondyle; continued to address with trigger point dry needling, manual therapy, heat and kinesiotaping.  Pt demonstrated improved tolerance of dry needling today.  Pt has initiated aerobic exercise at home this week; discussed ways to continue to progress.  Will continue to address and progress towards LTG.    Personal Factors and Comorbidities Comorbidity 3+;Fitness;Profession;Time since onset of injury/illness/exacerbation    Comorbidities allergic rhinitis,  GERD, migraine, HLD, IBS, lactose intolerance, vitamin D deficiency    Examination-Activity Limitations Carry;Lift;Sleep    Examination-Participation Restrictions Cleaning;Occupation;Meal Prep    Stability/Clinical Decision Making Evolving/Moderate complexity    Rehab Potential Good    PT Frequency 1x / week    PT Duration 6 weeks    PT Treatment/Interventions ADLs/Self Care Home Management;Cryotherapy;Electrical Stimulation;Moist  Heat;Therapeutic activities;Therapeutic exercise;Neuromuscular re-education;Patient/family education;Manual techniques;Passive range of motion;Dry needling;Taping    PT Next Visit Plan PNE/mirror.  TDN.   Walking or bike for aerobic.    Consulted and Agree with Plan of Care Patient           Patient will benefit from skilled therapeutic intervention in order to improve the following deficits and impairments:  Decreased activity tolerance,Decreased strength,Increased fascial restricitons,Impaired perceived functional ability,Impaired sensation,Impaired UE functional use,Pain  Visit Diagnosis: Pain in left elbow  Muscle weakness (generalized)  Other abnormalities of gait and mobility     Problem List Patient Active Problem List   Diagnosis Date Noted  . Migraine 11/01/2020  . Vitamin D deficiency 07/14/2018  . Prediabetes 07/14/2018  . NAFLD (nonalcoholic fatty liver disease) 04/54/0981  . Erythema nodosum 02/13/2013  . Acute transverse myelitis ([facility]) 02/13/2013    Dierdre Highman, PT, DPT 12/06/20    10:02 AM    [facility] [address] [place], [place], [address] Phone: [phone]   Fax:  [phone]  Name: Lei Dower MRN: [id number] Date of Birth: 1971-03-03

## 2020-12-09 ENCOUNTER — Other Ambulatory Visit: Payer: Self-pay

## 2020-12-09 ENCOUNTER — Ambulatory Visit: Payer: 59 | Admitting: Occupational Therapy

## 2020-12-09 DIAGNOSIS — M25522 Pain in left elbow: Secondary | ICD-10-CM | POA: Diagnosis not present

## 2020-12-09 DIAGNOSIS — M6281 Muscle weakness (generalized): Secondary | ICD-10-CM

## 2020-12-09 NOTE — Therapy (Signed)
[facility] [address] [place], [place], [address] Phone: [phone]   Fax:  [phone]  Occupational Therapy Treatment  Patient Details  Name: Heather Moreno MRN: [id number] Date of Birth: 23-Dec-1970 Referring Provider (OT): Dr. Dennard Nip   Encounter Date: 12/09/2020   OT End of Session - 12/09/20 0839    Visit Number 21    Number of Visits 26   16+10   Date for OT Re-Evaluation 12/27/20    Authorization Type [employer] 60 visit limit combined OT/PT, covered 100% after deductible met    Authorization Time Period renewal completed 11/20/20    OT Start Time 0721    OT Stop Time 0805    OT Time Calculation (min) 44 min    Activity Tolerance Patient tolerated treatment well    Behavior During Therapy [facility] for tasks assessed/performed           Past Medical History:  Diagnosis Date  . Allergic rhinitis   . Complication of anesthesia    difficulty with weakness after spinal  . Environmental allergies   . GERD (gastroesophageal reflux disease)   . Headache(784.0)   . HLD (hyperlipidemia)   . IBS (irritable bowel syndrome)   . Lactose intolerance   . Migraine headache with aura   . Vitamin D deficiency     Past Surgical History:  Procedure Laterality Date  . cesarean sections  2009, 2003  . NASAL SINUS SURGERY Bilateral 1989    There were no vitals filed for this visit.   Subjective Assessment - 12/09/20 0829    Subjective  Pt reports that she exercised with walking app using wrist/ankle weights.  Pt reports that she isn't always consistent with massage due to R elbow pain when performing  (tries to get family to help).  Pt reports that she isn't always consistent with splint/counterforce band (some days she's better than others).    Pertinent History L tennis elbow/lateral elbow pain.        PMH:  hyperlipidemia, Vitamin D Deficiency, hx of headaches/migraine, hx of transverse myelitis    Patient Stated  Goals improve pain    Currently in Pain? Yes    Pain Score 4     Pain Location Elbow    Pain Orientation Left    Pain Descriptors / Indicators Aching    Pain Type Acute pain    Pain Onset More than a month ago    Pain Frequency Intermittent    Aggravating Factors  fully bending/straightening elbow    Pain Relieving Factors rest, heat, massage, kinesiotape             Ultrasound x 84mn to L lateral elbow/dorsal forarm 343m, continuous, 1.0wts/cm2 with no adverse reactions.  Soft tissue massage to L lateral and dorsal forearm/elbow  Interferential Current to L elbow x10 min (low beat '80Hz' , high beat '150Hz' , Carrier freq 4000 Hz, Volts CV 7.6 with no adverse reactions for pain.  Kinesiotape applied to incr circulation and for edema, pain, and tightness (single localized strip at lateral elbow).        OT Education - 12/09/20 [id number]  Education Details Updates to HEP--see pt instructions    Person(s) Educated Patient    Methods Explanation;Demonstration;Handout;Verbal cues    Comprehension Verbalized understanding;Returned demonstration;Verbal cues required            OT Short Term Goals - 11/20/20 1426      OT SHORT TERM GOAL #1   Title Pt will  be independent with stretching HEP.--check STGs 10/16/20    Time 4    Period Weeks    Status Achieved      OT SHORT TERM GOAL #2   Title Pt will be independent with activity modifications/improved positioning for ADLs and IADLs to decr pain.    Time 4    Period Weeks    Status Achieved      OT SHORT TERM GOAL #3   Title Pt will be indepedent with splint and counterforce band wear/care.    Time 4    Period Weeks    Status Achieved   verbalizes understanding     OT SHORT TERM GOAL #4   Title Pt will report pain consistently less than or equal to 3/10 for light ADLs/IADLs.    Time 4    Period Weeks    Status On-going   up to 4/10 at times.  11/20/20:  not consistent yet            OT Long Term Goals - 11/20/20 1427       OT LONG TERM GOAL #1   Title Pt will be indepedent with strengthening HEP.    Time 8    Period Weeks    Status On-going   11/14/20:  unable to progress to strengthening yet due to pain     OT LONG TERM GOAL #2   Title Pt will be able to lift dishes with LUE without pain.    Time 8    Period Weeks    Status On-going   11/20/20:  unable     OT LONG TERM GOAL #3   Title Pt will be able to perform supination with elbow extended to assist with patient evalutions without pain.    Time 8    Period Weeks    Status On-going   11/20/20:  not consistent     OT LONG TERM GOAL #4   Title Pt will report pain consistently less than or equal to 1/10 for typical ADLs/IADLs including sleep.    Time 8    Period Weeks    Status On-going      OT LONG TERM GOAL #5   Title Pt will demo at least 40lbs L grip strength for ADLs/IADLs.    Baseline L-24lbs, R-60lbs    Time 8    Period Weeks    Status Revised   11/11/20:  24lbs                Plan - 12/09/20 0839    Clinical Impression Statement Pt reports pain in L elbow with elbow flex/ext transitional movements and with wrist flex.  Pt reports pain worse this morning but may be due to using wrist weights with exercise.  Pt reports improved ROM for functional tasks.    OT Occupational Profile and History Problem Focused Assessment - Including review of records relating to presenting problem    Occupational performance deficits (Please refer to evaluation for details): ADL's;IADL's;Work;Leisure;Rest and Sleep    Body Structure / Function / Physical Skills ADL;Strength;UE functional use;Decreased knowledge of use of DME;Decreased knowledge of precautions;Pain;Body mechanics;IADL    Comorbidities Affecting Occupational Performance: None    Modification or Assistance to Complete Evaluation  No modification of tasks or assist necessary to complete eval    OT Frequency 2x / week    OT Duration --   5wks, or 10 visits   OT  Treatment/Interventions Self-care/ADL training;Moist Heat;Fluidtherapy;DME and/or AE instruction;Splinting;Therapeutic activities;Contrast Bath;Ultrasound;Therapeutic exercise;Passive range of  motion;Iontophoresis;Cryotherapy;Electrical Stimulation;Paraffin;Manual Therapy;Patient/family education    Plan continue towards unmet goals; continue with modalities (ultrasound, interferential current), massage, stretching, isometrics to wrist? if tolerated    Consulted and Agree with Plan of Care Patient           Patient will benefit from skilled therapeutic intervention in order to improve the following deficits and impairments:   Body Structure / Function / Physical Skills: ADL,Strength,UE functional use,Decreased knowledge of use of DME,Decreased knowledge of precautions,Pain,Body mechanics,IADL       Visit Diagnosis: Pain in left elbow  Muscle weakness (generalized)    Problem List Patient Active Problem List   Diagnosis Date Noted  . Migraine 11/01/2020  . Vitamin D deficiency 07/14/2018  . Prediabetes 07/14/2018  . NAFLD (nonalcoholic fatty liver disease) 07/14/2018  . Erythema nodosum 02/13/2013  . Acute transverse myelitis ([place]) 02/13/2013    [facility] 12/09/2020, 9:43 AM  [facility] [address] [place], [place], [address] Phone: [phone]   Fax:  [phone]  Name: Heather Moreno MRN: [id number] Date of Birth: 1971/11/12   Vianne Bulls, OTR/L [facility] [address] [place], [place]  [address] [phone] phone [phone] 12/09/20 9:44 AM

## 2020-12-09 NOTE — Patient Instructions (Addendum)
ELBOW: Flexion (Isometric)    Use table or other hand as resistance. Press arm up against table/hand (bending elbow) with palm up. Hold 10 seconds. 5 reps per set, 1-2 sets per day    Extension (Isometric)    Keep elbow at side. With other hand on outside of forearm, apply resistance while trying to straighten elbow with thumb up. Do not move shoulder. Hold 10 seconds. Relax. Repeat 5 times. Do 1-2 sessions per day.       Stretch:  Extend elbow, bring forearm in pronation as able and wrist flex (give passive stretch to wrist) and tilt head to the right.  Hold 10-15sec, 2-3x/day.    Continue active elbow flex and extension with wrist straight and palm up.

## 2020-12-11 ENCOUNTER — Ambulatory Visit: Payer: 59 | Admitting: Occupational Therapy

## 2020-12-11 ENCOUNTER — Other Ambulatory Visit: Payer: Self-pay

## 2020-12-11 DIAGNOSIS — M6281 Muscle weakness (generalized): Secondary | ICD-10-CM

## 2020-12-11 DIAGNOSIS — M25522 Pain in left elbow: Secondary | ICD-10-CM | POA: Diagnosis not present

## 2020-12-11 NOTE — Therapy (Signed)
[facility] [address] [place], [place], [address] Phone: [phone]   Fax:  [phone]  Occupational Therapy Treatment  Patient Details  Name: Heather Moreno MRN: [id number] Date of Birth: 26-Aug-1971 Referring Provider (OT): Dr. Dennard Nip   Encounter Date: 12/11/2020   OT End of Session - 12/11/20 0720    Visit Number 22    Number of Visits 26    Date for OT Re-Evaluation 12/27/20    Authorization Type [employer] 60 visit limit combined OT/PT, covered 100% after deductible met    Authorization Time Period renewal completed 11/20/20    OT Start Time 0719    OT Stop Time 0800    OT Time Calculation (min) 41 min           Past Medical History:  Diagnosis Date  . Allergic rhinitis   . Complication of anesthesia    difficulty with weakness after spinal  . Environmental allergies   . GERD (gastroesophageal reflux disease)   . Headache(784.0)   . HLD (hyperlipidemia)   . IBS (irritable bowel syndrome)   . Lactose intolerance   . Migraine headache with aura   . Vitamin D deficiency     Past Surgical History:  Procedure Laterality Date  . cesarean sections  2009, 2003  . NASAL SINUS SURGERY Bilateral 1989    There were no vitals filed for this visit.   Subjective Assessment - 12/11/20 0719    Subjective  Pt report she awoke with mild pain    Pertinent History L tennis elbow/lateral elbow pain.        PMH:  hyperlipidemia, Vitamin D Deficiency, hx of headaches/migraine, hx of transverse myelitis    Patient Stated Goals improve pain    Currently in Pain? Yes    Pain Score 3     Pain Location Elbow    Pain Orientation Left    Pain Descriptors / Indicators Aching    Pain Type Acute pain    Pain Onset More than a month ago    Pain Frequency Intermittent    Aggravating Factors  movement    Pain Relieving Factors rest heat massage                     Ultrasound x 49mn to L lateral  elbow/dorsal forarm 358m, continuous, 1.0wts/cm2 with no adverse reactions.  Soft tissue massage to dorsal forearm/elbow, as well as joint mobs to elbow / radial head  Reviewed isometric exercises issued last visit 5 reps each, min v.c for shoulder positioning  Interferential Current to L elbow x8 min (low beat '80Hz' , high beat '150Hz' , Carrier freq 4000 Hz, Volts CV 7.6 with no adverse reactions for pain.                  OT Short Term Goals - 11/20/20 1426      OT SHORT TERM GOAL #1   Title Pt will be independent with stretching HEP.--check STGs 10/16/20    Time 4    Period Weeks    Status Achieved      OT SHORT TERM GOAL #2   Title Pt will be independent with activity modifications/improved positioning for ADLs and IADLs to decr pain.    Time 4    Period Weeks    Status Achieved      OT SHORT TERM GOAL #3   Title Pt will be indepedent with splint and counterforce band wear/care.    Time 4  Period Weeks    Status Achieved   verbalizes understanding     OT SHORT TERM GOAL #4   Title Pt will report pain consistently less than or equal to 3/10 for light ADLs/IADLs.    Time 4    Period Weeks    Status On-going   up to 4/10 at times.  11/20/20:  not consistent yet            OT Long Term Goals - 11/20/20 1427      OT LONG TERM GOAL #1   Title Pt will be indepedent with strengthening HEP.    Time 8    Period Weeks    Status On-going   11/14/20:  unable to progress to strengthening yet due to pain     OT LONG TERM GOAL #2   Title Pt will be able to lift dishes with LUE without pain.    Time 8    Period Weeks    Status On-going   11/20/20:  unable     OT LONG TERM GOAL #3   Title Pt will be able to perform supination with elbow extended to assist with patient evalutions without pain.    Time 8    Period Weeks    Status On-going   11/20/20:  not consistent     OT LONG TERM GOAL #4   Title Pt will report pain consistently less than or equal to 1/10 for  typical ADLs/IADLs including sleep.    Time 8    Period Weeks    Status On-going      OT LONG TERM GOAL #5   Title Pt will demo at least 40lbs L grip strength for ADLs/IADLs.    Baseline L-24lbs, R-60lbs    Time 8    Period Weeks    Status Revised   11/11/20:  24lbs                Plan - 12/11/20 0757    Clinical Impression Statement Pt reports her pain has improved overall since last visit. Pt has been performing isometric exercises at home.    OT Occupational Profile and History Problem Focused Assessment - Including review of records relating to presenting problem    Occupational performance deficits (Please refer to evaluation for details): ADL's;IADL's;Work;Leisure;Rest and Sleep    Body Structure / Function / Physical Skills ADL;Strength;UE functional use;Decreased knowledge of use of DME;Decreased knowledge of precautions;Pain;Body mechanics;IADL    Comorbidities Affecting Occupational Performance: None    Modification or Assistance to Complete Evaluation  No modification of tasks or assist necessary to complete eval    OT Frequency 2x / week    OT Duration --   5wks, or 10 visits   OT Treatment/Interventions Self-care/ADL training;Moist Heat;Fluidtherapy;DME and/or AE instruction;Splinting;Therapeutic activities;Contrast Bath;Ultrasound;Therapeutic exercise;Passive range of motion;Iontophoresis;Cryotherapy;Electrical Stimulation;Paraffin;Manual Therapy;Patient/family education    Plan continue towards unmet goals; continue with modalities (ultrasound, interferential current), massage, stretching, isometrics to wrist? if tolerated    Consulted and Agree with Plan of Care Patient           Patient will benefit from skilled therapeutic intervention in order to improve the following deficits and impairments:   Body Structure / Function / Physical Skills: ADL,Strength,UE functional use,Decreased knowledge of use of DME,Decreased knowledge of precautions,Pain,Body  mechanics,IADL       Visit Diagnosis: Pain in elbow,  muscle weakness    Problem List Patient Active Problem List   Diagnosis Date Noted  . Migraine 11/01/2020  . Vitamin  D deficiency 07/14/2018  . Prediabetes 07/14/2018  . NAFLD (nonalcoholic fatty liver disease) 07/14/2018  . Erythema nodosum 02/13/2013  . Acute transverse myelitis ([place]) 02/13/2013    Iziah Cates 12/11/2020, 12:08 PM  Theone Murdoch, OTR/L [phone] Phone: [phone] 12:09 PM 12/11/20 [facility] [address] [place], [place], [address] Phone: [phone]   Fax:  [phone]  Name: Heather Moreno MRN: [id number] Date of Birth: 09-Oct-1971

## 2020-12-13 ENCOUNTER — Ambulatory Visit: Payer: 59 | Admitting: Physical Therapy

## 2020-12-17 ENCOUNTER — Ambulatory Visit: Payer: 59 | Admitting: Occupational Therapy

## 2020-12-17 ENCOUNTER — Other Ambulatory Visit: Payer: Self-pay

## 2020-12-17 ENCOUNTER — Encounter: Payer: Self-pay | Admitting: Physical Therapy

## 2020-12-17 ENCOUNTER — Ambulatory Visit: Payer: 59 | Admitting: Physical Therapy

## 2020-12-17 DIAGNOSIS — M6281 Muscle weakness (generalized): Secondary | ICD-10-CM

## 2020-12-17 DIAGNOSIS — M25522 Pain in left elbow: Secondary | ICD-10-CM

## 2020-12-17 NOTE — Therapy (Signed)
[facility] [address] [place], [place], [address] Phone: [phone]   Fax:  [phone]  Occupational Therapy Treatment  Patient Details  Name: Heather Moreno MRN: [id number] Date of Birth: [birth year] Referring Provider (OT): Dr. Dennard Nip   Encounter Date: 12/17/2020   OT End of Session - 12/17/20 1011    Visit Number 23    Number of Visits 26    Date for OT Re-Evaluation 12/27/20    Authorization Type [employer] 60 visit limit combined OT/PT, covered 100% after deductible met    Authorization Time Period renewal completed 11/20/20    OT Start Time 0933    OT Stop Time 1015    OT Time Calculation (min) 42 min    Activity Tolerance Patient tolerated treatment well    Behavior During Therapy Heather Moreno for tasks assessed/performed           Past Medical History:  Diagnosis Date  . Allergic rhinitis   . Complication of anesthesia    difficulty with weakness after spinal  . Environmental allergies   . GERD (gastroesophageal reflux disease)   . Headache(784.0)   . HLD (hyperlipidemia)   . IBS (irritable bowel syndrome)   . Lactose intolerance   . Migraine headache with aura   . Vitamin D deficiency     Past Surgical History:  Procedure Laterality Date  . cesarean sections  2009, 2003  . NASAL SINUS SURGERY Bilateral 1989    There were no vitals filed for this visit.   Subjective Assessment - 12/17/20 1009    Subjective  Pt reports pain 3-4/10    Pertinent History L tennis elbow/lateral elbow pain.        PMH:  hyperlipidemia, Vitamin D Deficiency, hx of headaches/migraine, hx of transverse myelitis    Patient Stated Goals improve pain    Currently in Pain? Yes    Pain Score 4     Pain Location Elbow    Pain Orientation Left    Pain Descriptors / Indicators Aching    Pain Type Chronic pain    Pain Onset More than a month ago    Pain Frequency Intermittent    Aggravating Factors  movement    Pain  Relieving Factors rest, heat, massage                    Ultrasound x 63mn to L lateral elbow/dorsal forarm 344m, continuous, 1.0wts/cm2 with no adverse reactions.  Soft tissue massage to dorsal forearm/elbow, as well as joint mobs to elbow / radial head  Reviewed isometric exercises issued last visit 5 reps each, min v.c for shoulder positioning  Interferential Current to L elbow x 10 min (low beat _0 , high beat _1 , Carrier freq 4000 Hz, Volts CV 7.6-8.0 with no adverse reactions for pain.                   OT Education - 12/17/20 1013    Education Details isometric wrist flexion, and extension with elbow bent    Person(s) Educated Patient    Comprehension Verbalized understanding;Returned demonstration;Verbal cues required            OT Short Term Goals - 11/20/20 1426      OT SHORT TERM GOAL #1   Title Pt will be independent with stretching HEP.--check STGs 10/16/20    Time 4    Period Weeks    Status Achieved      OT SHORT TERM GOAL #2  Title Pt will be independent with activity modifications/improved positioning for ADLs and IADLs to decr pain.    Time 4    Period Weeks    Status Achieved      OT SHORT TERM GOAL #3   Title Pt will be indepedent with splint and counterforce band wear/care.    Time 4    Period Weeks    Status Achieved   verbalizes understanding     OT SHORT TERM GOAL #4   Title Pt will report pain consistently less than or equal to 3/10 for light ADLs/IADLs.    Time 4    Period Weeks    Status On-going   up to 4/10 at times.  11/20/20:  not consistent yet            OT Long Term Goals - 11/20/20 1427      OT LONG TERM GOAL #1   Title Pt will be indepedent with strengthening HEP.    Time 8    Period Weeks    Status On-going   11/14/20:  unable to progress to strengthening yet due to pain     OT LONG TERM GOAL #2   Title Pt will be able to lift dishes with LUE without pain.    Time 8    Period Weeks     Status On-going   11/20/20:  unable     OT LONG TERM GOAL #3   Title Pt will be able to perform supination with elbow extended to assist with patient evalutions without pain.    Time 8    Period Weeks    Status On-going   11/20/20:  not consistent     OT LONG TERM GOAL #4   Title Pt will report pain consistently less than or equal to 1/10 for typical ADLs/IADLs including sleep.    Time 8    Period Weeks    Status On-going      OT LONG TERM GOAL #5   Title Pt will demo at least 40lbs L grip strength for ADLs/IADLs.    Baseline L-24lbs, R-60lbs    Time 8    Period Weeks    Status Revised   11/11/20:  24lbs                Plan - 12/17/20 1010    Clinical Impression Statement Pt reports her pain has improved overall since last visit. Pt has been performing isometric exercises at home.    OT Occupational Profile and History Problem Focused Assessment - Including review of records relating to presenting problem    Occupational performance deficits (Please refer to evaluation for details): ADL's;IADL's;Work;Leisure;Rest and Sleep    Body Structure / Function / Physical Skills ADL;Strength;UE functional use;Decreased knowledge of use of DME;Decreased knowledge of precautions;Pain;Body mechanics;IADL    Rehab Potential Good    OT Frequency 2x / week    OT Duration --   5 weeks or 10 visits   OT Treatment/Interventions Self-care/ADL training;Moist Heat;Fluidtherapy;DME and/or AE instruction;Splinting;Therapeutic activities;Contrast Bath;Ultrasound;Therapeutic exercise;Passive range of motion;Iontophoresis;Cryotherapy;Electrical Stimulation;Paraffin;Manual Therapy;Patient/family education    Plan continue towards unmet goals; continue with modalities (ultrasound, interferential current), massage, stretching, isometrics to wrist? if tolerated    Consulted and Agree with Plan of Care Patient           Patient will benefit from skilled therapeutic intervention in order to improve the  following deficits and impairments:   Body Structure / Function / Physical Skills: ADL,Strength,UE functional use,Decreased knowledge of use  of DME,Decreased knowledge of precautions,Pain,Body mechanics,IADL       Visit Diagnosis: Pain in left elbow  Muscle weakness (generalized)    Problem List Patient Active Problem List   Diagnosis Date Noted  . Migraine 11/01/2020  . Vitamin D deficiency 07/14/2018  . Prediabetes 07/14/2018  . NAFLD (nonalcoholic fatty liver disease) 07/14/2018  . Erythema nodosum 02/13/2013  . Acute transverse myelitis ([place]) 02/13/2013    Heather Moreno 12/17/2020, 10:14 AM  [facility] [address] [place], [place], [address] Phone: [phone]   Fax:  [phone]  Name: Heather Moreno MRN: [id number] Date of Birth: September 08, 1971

## 2020-12-17 NOTE — Patient Instructions (Signed)
Wrist Flexion: Isometric    With left forearm resting palm up on thigh, resist upward movement of hand with other hand. Hold __10__ seconds. Relax. Repeat __5__ times per set. Do _1___ sets per session. Do __2__ sessions per day.  Copyright  VHI. All rights reserved.  Wrist Extension: Isometric    With left forearm resting palm down, elbow bent,  resist upward movement of hand with other hand. Hold __10__ seconds. Relax. Repeat __5__ times per set. Do __1__ sets per session. Do ___2_ sessions per day.  Copyright  VHI. All rights reserved.

## 2020-12-17 NOTE — Therapy (Signed)
[facility] [address] [place], [place], [address] Phone: [phone]   Fax:  [phone]  Physical Therapy Treatment  Patient Details  Name: Heather Moreno MRN: [id number] Date of Birth: Jul 16, 1971 Referring Provider (PT): Rodolph Bong, MD   Encounter Date: 12/17/2020   PT End of Session - 12/17/20 0930    Visit Number 4    Number of Visits 7    Date for PT Re-Evaluation 12/26/20    Authorization Type [employer]    PT Start Time 0805    PT Stop Time 0845    PT Time Calculation (min) 40 min    Equipment Utilized During Treatment Other (comment)   dry needles   Activity Tolerance Patient tolerated treatment well    Behavior During Therapy [facility] for tasks assessed/performed           Past Medical History:  Diagnosis Date  . Allergic rhinitis   . Complication of anesthesia    difficulty with weakness after spinal  . Environmental allergies   . GERD (gastroesophageal reflux disease)   . Headache(784.0)   . HLD (hyperlipidemia)   . IBS (irritable bowel syndrome)   . Lactose intolerance   . Migraine headache with aura   . Vitamin D deficiency     Past Surgical History:  Procedure Laterality Date  . cesarean sections  2009, 2003  . NASAL SINUS SURGERY Bilateral 1989    There were no vitals filed for this visit.   Subjective Assessment - 12/17/20 0808    Subjective Pt had to cancel Friday due to migraine with aura.  Was able to take medication to shorten duration and intensity of migraine.  Thinks the needling is helping.  Overall still getting better; feels like she gets a set back at night; still having some stiffness and it goes back down to baseline quickly.  Flexion is still the most aggravating.    Pertinent History allergic rhinitis, GERD, migraine, HLD, IBS, lactose intolerance, vitamin D deficiency    Patient Stated Goals Sleep and wake up with less pain (not sleeping well, sweating, restless, Menopause?)     Currently in Pain? Yes    Pain Location Elbow    Pain Orientation Left    Pain Descriptors / Indicators Other (Comment)   stiffness   Pain Type Chronic pain    Pain Onset More than a month ago                             [facility] Adult PT Treatment/Exercise - 12/17/20 1194      Neuro Re-ed    Neuro Re-ed Details  Educated pt on purpose of mirror therapy for pain management to decrease central sensitivity.  While heat applied to L forearm, L arm placed in mirror box to block view of LUE.  Removed rings and placed RUE on table and instructed pt to focus attention to image of "LUE" in mirror.  Guided pt through AROM: 12 repetitions of wrist flexion <> extension, supination <> pronation, opening and closing, elbow flexion <> extension, Isometric squeezes of tension ball, 1lb weighted wrist flexion, wrist extension, pronation <> supination.  Cued to attend to pain free movement of "LUE".      Modalities   Modalities Moist Heat      Moist Heat Therapy   Number Minutes Moist Heat 10 Minutes    Moist Heat Location Other (comment)   L forearm/extensor mm group  Trigger Point Dry Needling - 12/17/20 0902    Consent Given? Yes    Education Handout Provided Previously provided    Muscles Treated Upper Quadrant Supinator    Muscles Treated Wrist/Hand Extensor carpi radialis longus/brevis;Extensor digitorum    Dry Needling Comments Performed in sitting with LUE supported in pronation    Supinator Response Twitch response elicited;Palpable increased muscle length    Extensor carpi radialis longus/brevis Response Twitch response elicited;Palpable increased muscle length    Extensor digitorum Response Twitch response elicited;Palpable increased muscle length                PT Education - 12/17/20 0908    Education Details NMR and mirror therapy    Person(s) Educated Patient    Methods Explanation    Comprehension Verbalized understanding                PT Long Term Goals - 11/11/20 1009      PT LONG TERM GOAL #1   Title Pt will report 50% improvement in painful symptoms after a full night's sleep and after a full work day.    Time 6    Period Weeks    Status New    Target Date 12/26/20      PT LONG TERM GOAL #2   Title Pt will receive pain neuroscience education and will begin to identify ways to reduce catastrophizing thoughts and use of pain acknowledgement scale with activities    Time 6    Period Weeks    Status New    Target Date 12/26/20      PT LONG TERM GOAL #3   Title Pt will report return to walking program or use of stationary bike for aerobic conditioning    Time 6    Period Weeks    Status New    Target Date 12/26/20      PT LONG TERM GOAL #4   Title Due to decreased pain and improved ability to participate in OT strengthening HEP - pt will demonstrate 10-15lb increase in L hand grip    Baseline 30 lb L, 70 lb R    Time 6    Period Weeks    Status New    Target Date 12/26/20                 Plan - 12/17/20 0930    Clinical Impression Statement Pt continues to present with notable painful myofascial trigger points in supinator and extensor muscle groups.  Mainly focused trigger point dry needling on supinator muscle today.  While applying moist heat educated pt on use of mirror therapy for NMR and engaged pt in UE mirror therapy.  Pt tolerated well; will add more visits at one time a week and will continue to progress towards LTG.    Personal Factors and Comorbidities Comorbidity 3+;Fitness;Profession;Time since onset of injury/illness/exacerbation    Comorbidities allergic rhinitis, GERD, migraine, HLD, IBS, lactose intolerance, vitamin D deficiency    Examination-Activity Limitations Carry;Lift;Sleep    Examination-Participation Restrictions Cleaning;Occupation;Meal Prep    Stability/Clinical Decision Making Evolving/Moderate complexity    Rehab Potential Good    PT Frequency 1x / week    PT Duration 6  weeks    PT Treatment/Interventions ADLs/Self Care Home Management;Cryotherapy;Electrical Stimulation;Moist Heat;Therapeutic activities;Therapeutic exercise;Neuromuscular re-education;Patient/family education;Manual techniques;Passive range of motion;Dry needling;Taping    PT Next Visit Plan Check LTG.  PNE/mirror.  TDN.   Walking or bike for aerobic.    Consulted and Agree with Plan of  Care Patient           Patient will benefit from skilled therapeutic intervention in order to improve the following deficits and impairments:  Decreased activity tolerance,Decreased strength,Increased fascial restricitons,Impaired perceived functional ability,Impaired sensation,Impaired UE functional use,Pain  Visit Diagnosis: Pain in left elbow  Muscle weakness (generalized)     Problem List Patient Active Problem List   Diagnosis Date Noted  . Migraine 11/01/2020  . Vitamin D deficiency 07/14/2018  . Prediabetes 07/14/2018  . NAFLD (nonalcoholic fatty liver disease) 85/27/7824  . Erythema nodosum 02/13/2013  . Acute transverse myelitis ([facility]) 02/13/2013    Dierdre Highman, PT, DPT 12/17/20    9:33 AM    [facility] [address] [place], [place], [address] Phone: [phone]   Fax:  [phone]  Name: Heather Moreno MRN: [id number] Date of Birth: 06/01/1971

## 2020-12-19 ENCOUNTER — Ambulatory Visit: Payer: 59 | Admitting: Occupational Therapy

## 2020-12-23 ENCOUNTER — Ambulatory Visit: Payer: 59 | Admitting: Occupational Therapy

## 2020-12-23 ENCOUNTER — Ambulatory Visit (INDEPENDENT_AMBULATORY_CARE_PROVIDER_SITE_OTHER): Payer: 59 | Admitting: Family Medicine

## 2020-12-26 ENCOUNTER — Encounter: Payer: Self-pay | Admitting: Physical Therapy

## 2020-12-26 ENCOUNTER — Other Ambulatory Visit: Payer: Self-pay

## 2020-12-26 ENCOUNTER — Ambulatory Visit: Payer: 59 | Admitting: Physical Therapy

## 2020-12-26 DIAGNOSIS — M25522 Pain in left elbow: Secondary | ICD-10-CM | POA: Diagnosis not present

## 2020-12-26 DIAGNOSIS — R2689 Other abnormalities of gait and mobility: Secondary | ICD-10-CM

## 2020-12-26 DIAGNOSIS — M6281 Muscle weakness (generalized): Secondary | ICD-10-CM

## 2020-12-26 NOTE — Therapy (Signed)
[facility] [address] [place], [place], [address] Phone: [phone]   Fax:  [phone]  Physical Therapy Treatment  Patient Details  Name: Heather Moreno MRN: [id number] Date of Birth: July 03, 1971 Referring Provider (PT): Gregor Hams, MD   Encounter Date: 12/26/2020   PT End of Session - 12/26/20 1131    Visit Number 5    Number of Visits 9    Date for PT Re-Evaluation 01/25/21    Authorization Type [employer]    PT Start Time 0810    PT Stop Time 0850    PT Time Calculation (min) 40 min    Equipment Utilized During Treatment Other (comment)   dry needles   Activity Tolerance Patient tolerated treatment well    Behavior During Therapy [facility] for tasks assessed/performed           Past Medical History:  Diagnosis Date  . Allergic rhinitis   . Complication of anesthesia    difficulty with weakness after spinal  . Environmental allergies   . GERD (gastroesophageal reflux disease)   . Headache(784.0)   . HLD (hyperlipidemia)   . IBS (irritable bowel syndrome)   . Lactose intolerance   . Migraine headache with aura   . Vitamin D deficiency     Past Surgical History:  Procedure Laterality Date  . cesarean sections  2009, 2003  . NASAL SINUS SURGERY Bilateral 1989    There were no vitals filed for this visit.   Subjective Assessment - 12/26/20 0812    Subjective Had to cancel again due to a migraine.  Hasn't been able to do exercises due to migraine and had to miss work.  Arm is no worse but no better; still most symptomatic in the morning.    Pertinent History allergic rhinitis, GERD, migraine, HLD, IBS, lactose intolerance, vitamin D deficiency    Patient Stated Goals Sleep and wake up with less pain (not sleeping well, sweating, restless, Menopause?)    Currently in Pain? Yes    Pain Onset More than a month ago              [facility] PT Assessment - 12/26/20 0817      Assessment   Medical Diagnosis  Lateral epicondylitis    Referring Provider (PT) Gregor Hams, MD    Onset Date/Surgical Date 11/05/20    Hand Dominance Right    Prior Therapy receiving OT      Precautions   Precautions None      Prior Function   Level of Independence Independent      ROM / Strength   AROM / PROM / Strength Strength      Strength   Right/Left hand Right;Left    Right Hand Grip (lbs) 70    Left Hand Grip (lbs) 52.6 lb                         [facility] Adult PT Treatment/Exercise - 12/26/20 1126      Modalities   Modalities Moist Heat      Moist Heat Therapy   Number Minutes Moist Heat 6 Minutes    Moist Heat Location Other (comment)   L forearm; at end of session     Manual Therapy   Manual Therapy Joint mobilization    Manual therapy comments performed to LUE after performing TDN; pt reporting decreased pain and stiffness after performing manual therapy    Joint Mobilization Performed mobilizations with  movement to L radial head during supination <> pronation and elbow flexion <> extension through partial ROM.  For supination and pronation facilitated radius spinning medially for pronation and laterally for supination.  For elbow flexion facilitated radius posterior roll and glide on humerus; for extension facilitated radius anterior roll and glide on humerus.            Trigger Point Dry Needling - 12/26/20 1125    Consent Given? Yes    Education Handout Provided Previously provided    Muscles Treated Upper Quadrant Supinator    Muscles Treated Wrist/Hand Extensor carpi radialis longus/brevis;Extensor digitorum    Dry Needling Comments Performed in sitting with LUE supported in pronation    Supinator Response Twitch response elicited;Palpable increased muscle length    Extensor carpi radialis longus/brevis Response Twitch response elicited;Palpable increased muscle length    Extensor digitorum Response Twitch response elicited;Palpable increased muscle length                 PT Education - 12/26/20 1131    Education Details progress towards goals and addition of 4 more visits    Person(s) Educated Patient    Methods Explanation    Comprehension Verbalized understanding               PT Long Term Goals - 12/26/20 1138      PT LONG TERM GOAL #1   Title Pt will report 50% improvement in painful symptoms after a full night's sleep and after a full work day.    Baseline atleast 50% improvement after sleep and working; rarely wakes up in pain now, less stiffness in the morning.    Time 6    Period Weeks    Status Achieved      PT LONG TERM GOAL #2   Title Pt will receive pain neuroscience education and will begin to identify ways to reduce catastrophizing thoughts and use of pain acknowledgement scale with activities    Time 6    Period Weeks    Status On-going      PT LONG TERM GOAL #3   Title Pt will report return to walking program or use of stationary bike for aerobic conditioning    Baseline HA limited pt from being able to exercise; tried walking program with wrist weight but exacerbated symptoms.    Time 6    Period Weeks    Status Partially Met      PT LONG TERM GOAL #4   Title Due to decreased pain and improved ability to participate in OT strengthening HEP - pt will demonstrate 10-15lb increase in L hand grip    Baseline L: 30lb > 52.6lb    Time 6    Period Weeks    Status Achieved           New goals for recertification:  PT Long Term Goals - 12/26/20 1138      PT LONG TERM GOAL #1   Title Pt will report 80% improvement in painful symptoms after a full night's sleep and after a full work day.    Baseline atleast 50% improvement after sleep and working; rarely wakes up in pain now, less stiffness in the morning.    Time 4    Period Weeks    Status Revised    Target Date 01/25/21      PT LONG TERM GOAL #2   Title Pt will receive pain neuroscience education and will begin to identify ways to reduce  catastrophizing  thoughts and use of pain acknowledgement scale with activities    Time 4    Period Weeks    Status Revised    Target Date 01/25/21      PT LONG TERM GOAL #3   Title Pt will report return to walking program or use of stationary bike for aerobic conditioning 3-4 days out of the week    Baseline HA limited pt from being able to exercise; tried walking program with wrist weight but exacerbated symptoms.    Time 4    Period Weeks    Status Revised    Target Date 01/25/21      PT LONG TERM GOAL #4   Title Due to decreased pain and improved ability to participate in OT strengthening HEP - pt will demonstrate increase in L grip strength to >/= 60lb    Baseline L: 30lb > 52.6lb    Time 4    Period Weeks    Status Revised    Target Date 01/25/21                Plan - 12/26/20 1133    Clinical Impression Statement Performed assessment of progress towards LTG.  Pt is making steady progress and is tolerating PT treatment with trigger point dry needling and manual therapy.  Pt has met 2/4 LTG, one goal ongoing and one goal partially met.  Pt reports 50% reduction in symptoms and demonstrates >10lb improvement in L grip strength.  Pt has partially returned to previous walking program but has been limited recently by migraines.  Therapy continues to provide PNE as part of therapeutic process.  Pt will benefit from continued skilled PT services to continue to address myofasical pain and trigger points, impaired joint ROM and weakness and continue to provide PNE.  Address ongoing trigger points in supinator and wrist/finger extensor muscles today and address joint ROM with mobilization with movement.    Personal Factors and Comorbidities Comorbidity 3+;Fitness;Profession;Time since onset of injury/illness/exacerbation    Comorbidities allergic rhinitis, GERD, migraine, HLD, IBS, lactose intolerance, vitamin D deficiency    Examination-Activity Limitations Carry;Lift;Sleep     Examination-Participation Restrictions Cleaning;Occupation;Meal Prep    Stability/Clinical Decision Making Evolving/Moderate complexity    Rehab Potential Good    PT Frequency 1x / week    PT Duration 4 weeks    PT Treatment/Interventions ADLs/Self Care Home Management;Cryotherapy;Electrical Stimulation;Moist Heat;Therapeutic activities;Therapeutic exercise;Neuromuscular re-education;Patient/family education;Manual techniques;Passive range of motion;Dry needling;Taping    PT Next Visit Plan PNE/mirror.  TDN.  Mobilization with movement.  Walking or bike for aerobic.    Consulted and Agree with Plan of Care Patient           Patient will benefit from skilled therapeutic intervention in order to improve the following deficits and impairments:  Decreased activity tolerance,Decreased strength,Increased fascial restricitons,Impaired perceived functional ability,Impaired sensation,Impaired UE functional use,Pain  Visit Diagnosis: Pain in left elbow  Muscle weakness (generalized)  Other abnormalities of gait and mobility     Problem List Patient Active Problem List   Diagnosis Date Noted  . Migraine 11/01/2020  . Vitamin D deficiency 07/14/2018  . Prediabetes 07/14/2018  . NAFLD (nonalcoholic fatty liver disease) 07/14/2018  . Erythema nodosum 02/13/2013  . Acute transverse myelitis ([place]) 02/13/2013   Rico Junker, PT, DPT 12/26/20    11:41 AM    [facility] [address] [place], [place], [address] Phone: [phone]   Fax:  [phone]  Name: Heather Moreno MRN: [id number] Date  of Birth: 11/02/1971

## 2020-12-27 ENCOUNTER — Other Ambulatory Visit: Payer: Self-pay | Admitting: Family Medicine

## 2020-12-27 ENCOUNTER — Other Ambulatory Visit (INDEPENDENT_AMBULATORY_CARE_PROVIDER_SITE_OTHER): Payer: Self-pay | Admitting: Family Medicine

## 2020-12-27 DIAGNOSIS — E7849 Other hyperlipidemia: Secondary | ICD-10-CM

## 2020-12-27 NOTE — Telephone Encounter (Signed)
Rx refill request approved per Dr. Corey's orders. 

## 2020-12-30 NOTE — Telephone Encounter (Signed)
Would you like to refill? 

## 2020-12-31 NOTE — Telephone Encounter (Signed)
yes

## 2021-01-03 ENCOUNTER — Ambulatory Visit: Payer: 59 | Attending: Family Medicine | Admitting: Physical Therapy

## 2021-01-03 ENCOUNTER — Other Ambulatory Visit: Payer: Self-pay

## 2021-01-03 ENCOUNTER — Ambulatory Visit: Payer: 59 | Admitting: Occupational Therapy

## 2021-01-03 ENCOUNTER — Encounter: Payer: Self-pay | Admitting: Physical Therapy

## 2021-01-03 DIAGNOSIS — R2689 Other abnormalities of gait and mobility: Secondary | ICD-10-CM

## 2021-01-03 DIAGNOSIS — M6281 Muscle weakness (generalized): Secondary | ICD-10-CM | POA: Diagnosis present

## 2021-01-03 DIAGNOSIS — M25522 Pain in left elbow: Secondary | ICD-10-CM

## 2021-01-03 NOTE — Patient Instructions (Signed)
  Strengthening: Resisted Flexion   Hold tubing with _left____ arm(s) at side. Pull forward and up. Move shoulder through pain-free range of motion. Repeat __10__ times per set.  Do _1-2_ sessions per day , every other day   Strengthening: Resisted Extension   Hold tubing in left_____ hand(s), arm forward. Pull arm back, elbow straight. Repeat _10___ times per set. Do _1-2___ sessions per day, every other day.   

## 2021-01-03 NOTE — Therapy (Signed)
[facility] [address] [place], [place], [address] Phone: [phone]   Fax:  [phone]  Occupational Therapy Treatment  Patient Details  Name: Heather Moreno MRN: [id number] Date of Birth: 08/12/1971 Referring Provider (OT): Dr. Dennard Nip   Encounter Date: 01/03/2021   OT End of Session - 01/03/21 1505    Visit Number 25    Number of Visits 35    Date for OT Re-Evaluation 12/27/20    Authorization Type [employer] 60 visit limit combined OT/PT, covered 100% after deductible met    Authorization Time Period renewal completed 01/02/21  week 1/5 starts next week    OT Start Time 0805    OT Stop Time 0845    OT Time Calculation (min) 40 min    Activity Tolerance Patient tolerated treatment well    Behavior During Therapy [facility] for tasks assessed/performed           Past Medical History:  Diagnosis Date  . Allergic rhinitis   . Complication of anesthesia    difficulty with weakness after spinal  . Environmental allergies   . GERD (gastroesophageal reflux disease)   . Headache(784.0)   . HLD (hyperlipidemia)   . IBS (irritable bowel syndrome)   . Lactose intolerance   . Migraine headache with aura   . Vitamin D deficiency     Past Surgical History:  Procedure Laterality Date  . cesarean sections  2009, 2003  . NASAL SINUS SURGERY Bilateral 1989    There were no vitals filed for this visit.   Subjective Assessment - 01/03/21 0806    Pertinent History L tennis elbow/lateral elbow pain.        PMH:  hyperlipidemia, Vitamin D Deficiency, hx of headaches/migraine, hx of transverse myelitis    Patient Stated Goals improve pain    Currently in Pain? Yes    Pain Score 3     Pain Location Elbow    Pain Orientation Left    Pain Descriptors / Indicators Aching    Pain Type Chronic pain    Pain Onset More than a month ago    Pain Frequency Intermittent    Aggravating Factors  movement    Pain Relieving Factors  rest heat and massage                Ultrasound x 2mn to L lateral elbow/dorsal forarm 389m, continuous, 0.8 wts/cm2 with no adverse reactions.  Soft tissue massage to dorsal forearm/elbow, as well as joint mobs to elbow / radial head, with(distraction lateral motion and gentle supination/ pronation)  Reviewed isometric exercises for elbow and gentle stretches 5 reps each, min v.c for shoulder positioning                OT Education - 01/03/21 1504    Education Details reveiwed isometic exercises for elbow and gentle self  stretch, followed by yellow theraband shoulder flexion and extension for proximal strengthening.    Person(s) Educated Patient    Methods Explanation;Demonstration;Handout;Verbal cues    Comprehension Verbalized understanding;Returned demonstration;Verbal cues required            OT Short Term Goals - 01/03/21 0807      OT SHORT TERM GOAL #1   Title Pt will be independent with stretching HEP.--check STGs 10/16/20    Time 4    Period Weeks    Status Achieved      OT SHORT TERM GOAL #2   Title Pt will be independent with activity  modifications/improved positioning for ADLs and IADLs to decr pain.    Time 4    Period Weeks    Status Achieved      OT SHORT TERM GOAL #3   Title Pt will be indepedent with splint and counterforce band wear/care.    Time 4    Period Weeks    Status Achieved   verbalizes understanding     OT SHORT TERM GOAL #4   Title Pt will report pain consistently less than or equal to 3/10 for light ADLs/IADLs.    Time 4    Period Weeks    Status Achieved   up to 4/10 at times.  11/20/20:  not consistent yet            OT Long Term Goals - 01/03/21 0807      OT LONG TERM GOAL #1   Title Pt will be indepedent with strengthening HEP.    Time 5    Status On-going      OT LONG TERM GOAL #2   Title Pt will be able to lift dishes with LUE without pain.    Time 5    Period Weeks    Status On-going   does not  consistently use LUE for theis task due to fear of pain     OT LONG TERM GOAL #3   Title Pt will be able to perform supination with elbow extended to assist with patient evalutions without pain.    Status Achieved      OT LONG TERM GOAL #4   Title Pt will report pain consistently less than or equal to 1/10 for typical ADLs/IADLs including sleep.    Time 5    Period Weeks    Status On-going   2-3/10     OT LONG TERM GOAL #5   Title Pt will demo at least 40lbs L grip strength for ADLs/IADLs.    Status Achieved   70.1 with pain     Long Term Additional Goals   Additional Long Term Goals Yes      OT LONG TERM GOAL #6   Title Pt will demonstrate ability to retrieve a lightweight object from overhead shelf with out stretched arm and wrist extension with pain no greater than 2/10.    Time 5    Period Weeks    Status New                 Plan - 01/03/21 1507    Clinical Impression Statement Pt demonstrates good overall progress with decreased LUE pain and increasing strength. Pt will benefit from continue skilled OT to address LUE strength, functional use and pain reduction during daily activities.    OT Occupational Profile and History Problem Focused Assessment - Including review of records relating to presenting problem    Occupational performance deficits (Please refer to evaluation for details): ADL's;IADL's;Work;Leisure;Rest and Sleep    Body Structure / Function / Physical Skills ADL;Strength;UE functional use;Decreased knowledge of use of DME;Decreased knowledge of precautions;Pain;Body mechanics;IADL    Rehab Potential Good    OT Frequency 2x / week    OT Duration --   5 weeks or 10 visits   OT Treatment/Interventions Self-care/ADL training;Moist Heat;Fluidtherapy;DME and/or AE instruction;Splinting;Therapeutic activities;Contrast Bath;Ultrasound;Therapeutic exercise;Passive range of motion;Iontophoresis;Cryotherapy;Electrical Stimulation;Paraffin;Manual  Therapy;Patient/family education    Plan continue towards unmet goals; continue with modalities (ultrasound, interferential current), massage, stretching, reveiw isometrics to wrist, pt will likely need renewal, visits added    Consulted and Agree with  Plan of Care Patient           Patient will benefit from skilled therapeutic intervention in order to improve the following deficits and impairments:   Body Structure / Function / Physical Skills: ADL,Strength,UE functional use,Decreased knowledge of use of DME,Decreased knowledge of precautions,Pain,Body mechanics,IADL       Visit Diagnosis: Pain in left elbow  Muscle weakness (generalized)  Other abnormalities of gait and mobility    Problem List Patient Active Problem List   Diagnosis Date Noted  . Migraine 11/01/2020  . Vitamin D deficiency 07/14/2018  . Prediabetes 07/14/2018  . NAFLD (nonalcoholic fatty liver disease) 07/14/2018  . Erythema nodosum 02/13/2013  . Acute transverse myelitis ([place]) 02/13/2013    Heather Moreno 01/03/2021, 3:11 PM Heather Moreno, OTR/L [phone] Phone: [phone] 3:14 PM 01/03/21 [facility] [address] [place], [place], [address] Phone: [phone]   Fax:  [phone]  Name: Heather Moreno MRN: [id number] Date of Birth: 1971-05-27

## 2021-01-03 NOTE — Therapy (Signed)
[facility] [address] [place], [place], [address] Phone: [phone]   Fax:  [phone]  Physical Therapy Treatment  Patient Details  Name: Heather Moreno MRN: [id number] Date of Birth: January 10, 1971 Referring Provider (PT): Rodolph Bong, MD   Encounter Date: 01/03/2021   PT End of Session - 01/03/21 1004    Visit Number 6    Number of Visits 9    Date for PT Re-Evaluation 01/25/21    Authorization Type Aetna    PT Start Time 0845    PT Stop Time 0929    PT Time Calculation (min) 44 min    Equipment Utilized During Treatment Other (comment)   dry needles   Activity Tolerance Patient tolerated treatment well    Behavior During Therapy [facility] for tasks assessed/performed           Past Medical History:  Diagnosis Date  . Allergic rhinitis   . Complication of anesthesia    difficulty with weakness after spinal  . Environmental allergies   . GERD (gastroesophageal reflux disease)   . Headache(784.0)   . HLD (hyperlipidemia)   . IBS (irritable bowel syndrome)   . Lactose intolerance   . Migraine headache with aura   . Vitamin D deficiency     Past Surgical History:  Procedure Laterality Date  . cesarean sections  2009, 2003  . NASAL SINUS SURGERY Bilateral 1989    There were no vitals filed for this visit.   Subjective Assessment - 01/03/21 1128    Subjective OT going well; added theraband shoulder exercises.  Notices discomfort with prolonged elbow flexion - holding phone.    Pertinent History allergic rhinitis, GERD, migraine, HLD, IBS, lactose intolerance, vitamin D deficiency    Patient Stated Goals Sleep and wake up with less pain (not sleeping well, sweating, restless, Menopause?)    Currently in Pain? Yes    Pain Onset More than a month ago                             [facility] Adult PT Treatment/Exercise - 01/03/21 0623      Neuro Re-ed    Neuro Re-ed Details  While heat applied  to L forearm pt participated in mirror therapy with LUE blocked from view; pt cued to attend to "LUE" mirror image of RUE.  RUE performed various active movements x 12 reps: supination <> Pronation, supination with wrist flexion, supination with wrist flexion and finger flexion, pronation with wrist extension, supination with wrist flexion and elbow flexion, holding phone gripping and performing elbow flexion.  Then had pt close eyes and perform visualization of various movements with LUE and visualize performing that activity or movement (holding phone to ear) without pain.  Performed x 3 minutes.      Modalities   Modalities Moist Heat      Moist Heat Therapy   Number Minutes Moist Heat 15 Minutes    Moist Heat Location Other (comment);Elbow   L forearm     Manual Therapy   Manual Therapy Joint mobilization    Manual therapy comments Performed to L forearm, Radius moving on humerus.  Incorporated stationary mobilizations and the mobilization with movement    Joint Mobilization Performed mobilizations with movement to L radial head during supination <> pronation and elbow flexion <> extension through partial ROM.  For supination and pronation facilitated radius spinning medially for pronation and laterally for supination.  For  elbow flexion facilitated radius posterior roll and glide on humerus; for extension facilitated radius anterior roll and glide on humerus.    Soft tissue mobilization STM to L wrist and finger extensor muscles after TDN along the length of the muscle            Trigger Point Dry Needling - 01/03/21 0957    Consent Given? Yes    Education Handout Provided Previously provided    Muscles Treated Wrist/Hand Extensor carpi radialis longus/brevis;Extensor digitorum    Dry Needling Comments Performed in sitting with LUE supported in pronation    Extensor carpi radialis longus/brevis Response Twitch response elicited;Palpable increased muscle length    Extensor digitorum  Response Twitch response elicited;Palpable increased muscle length                     PT Long Term Goals - 12/26/20 1138      PT LONG TERM GOAL #1   Title Pt will report 80% improvement in painful symptoms after a full night's sleep and after a full work day.    Baseline atleast 50% improvement after sleep and working; rarely wakes up in pain now, less stiffness in the morning.    Time 4    Period Weeks    Status Revised    Target Date 01/25/21      PT LONG TERM GOAL #2   Title Pt will receive pain neuroscience education and will begin to identify ways to reduce catastrophizing thoughts and use of pain acknowledgement scale with activities    Time 4    Period Weeks    Status Revised    Target Date 01/25/21      PT LONG TERM GOAL #3   Title Pt will report return to walking program or use of stationary bike for aerobic conditioning 3-4 days out of the week    Baseline HA limited pt from being able to exercise; tried walking program with wrist weight but exacerbated symptoms.    Time 4    Period Weeks    Status Revised    Target Date 01/25/21      PT LONG TERM GOAL #4   Title Due to decreased pain and improved ability to participate in OT strengthening HEP - pt will demonstrate increase in L grip strength to >/= 60lb    Baseline L: 30lb > 52.6lb    Time 4    Period Weeks    Status Revised    Target Date 01/25/21                 Plan - 01/03/21 1005    Clinical Impression Statement Pt continues to report pain over extensor mm group especially with prolonged elbow flexion.  Pain is more centralized at lateral epicondyle.  Continued to address with trigger point dry needling, manual therapy, heat and NMR using mirror therapy and visualization.  Will continue to address in order to to progress towards LTG.    Personal Factors and Comorbidities Comorbidity 3+;Fitness;Profession;Time since onset of injury/illness/exacerbation    Comorbidities allergic rhinitis,  GERD, migraine, HLD, IBS, lactose intolerance, vitamin D deficiency    Examination-Activity Limitations Carry;Lift;Sleep    Examination-Participation Restrictions Cleaning;Occupation;Meal Prep    Stability/Clinical Decision Making Evolving/Moderate complexity    Rehab Potential Good    PT Frequency 1x / week    PT Duration 4 weeks    PT Treatment/Interventions ADLs/Self Care Home Management;Cryotherapy;Electrical Stimulation;Moist Heat;Therapeutic activities;Therapeutic exercise;Neuromuscular re-education;Patient/family education;Manual techniques;Passive range of motion;Dry needling;Taping  PT Next Visit Plan PNE/mirror.  TDN.  Mobilization with movement.  Walking or bike for aerobic.    Consulted and Agree with Plan of Care Patient           Patient will benefit from skilled therapeutic intervention in order to improve the following deficits and impairments:  Decreased activity tolerance,Decreased strength,Increased fascial restricitons,Impaired perceived functional ability,Impaired sensation,Impaired UE functional use,Pain  Visit Diagnosis: Pain in left elbow  Muscle weakness (generalized)  Other abnormalities of gait and mobility     Problem List Patient Active Problem List   Diagnosis Date Noted  . Migraine 11/01/2020  . Vitamin D deficiency 07/14/2018  . Prediabetes 07/14/2018  . NAFLD (nonalcoholic fatty liver disease) 20/08/711  . Erythema nodosum 02/13/2013  . Acute transverse myelitis (HCC) 02/13/2013    Dierdre Highman, PT, DPT 01/03/21    11:29 AM    [facility] [address] [place], [place], [address] Phone: [phone]   Fax:  [phone]  Name: Heather Moreno MRN: [id number] Date of Birth: 09-13-1971

## 2021-01-07 ENCOUNTER — Other Ambulatory Visit: Payer: Self-pay

## 2021-01-07 ENCOUNTER — Ambulatory Visit: Payer: 59 | Admitting: Occupational Therapy

## 2021-01-07 ENCOUNTER — Ambulatory Visit: Payer: 59 | Admitting: Physical Therapy

## 2021-01-07 ENCOUNTER — Encounter: Payer: Self-pay | Admitting: Physical Therapy

## 2021-01-07 DIAGNOSIS — R2689 Other abnormalities of gait and mobility: Secondary | ICD-10-CM

## 2021-01-07 DIAGNOSIS — M6281 Muscle weakness (generalized): Secondary | ICD-10-CM

## 2021-01-07 DIAGNOSIS — M25522 Pain in left elbow: Secondary | ICD-10-CM

## 2021-01-07 NOTE — Therapy (Signed)
[facility] [address] [place], [place], [address] Phone: [phone]   Fax:  [phone]  Physical Therapy Treatment  Patient Details  Name: Heather Moreno MRN: [id number] Date of Birth: 05-02-1971 Referring Provider (PT): Rodolph Bong, MD   Encounter Date: 01/07/2021   PT End of Session - 01/07/21 1214    Visit Number 7    Number of Visits 9    Date for PT Re-Evaluation 01/25/21    Authorization Type Aetna    PT Start Time 0800    PT Stop Time 0843    PT Time Calculation (min) 43 min    Equipment Utilized During Treatment Other (comment)   dry needles   Activity Tolerance Patient tolerated treatment well    Behavior During Therapy [facility] for tasks assessed/performed           Past Medical History:  Diagnosis Date  . Allergic rhinitis   . Complication of anesthesia    difficulty with weakness after spinal  . Environmental allergies   . GERD (gastroesophageal reflux disease)   . Headache(784.0)   . HLD (hyperlipidemia)   . IBS (irritable bowel syndrome)   . Lactose intolerance   . Migraine headache with aura   . Vitamin D deficiency     Past Surgical History:  Procedure Laterality Date  . cesarean sections  2009, 2003  . NASAL SINUS SURGERY Bilateral 1989    There were no vitals filed for this visit.   Subjective Assessment - 01/07/21 0804    Subjective Added some elbow exercises with OT.  Having a little more wrist pain today, thinks it may be because she has been reaching across at night to turn off lamp with wrist extension, pincer grasp and forearm supination/pronation.    Pertinent History allergic rhinitis, GERD, migraine, HLD, IBS, lactose intolerance, vitamin D deficiency    Patient Stated Goals Sleep and wake up with less pain (not sleeping well, sweating, restless, Menopause?)    Currently in Pain? Yes    Pain Onset More than a month ago                             [facility]  Adult PT Treatment/Exercise - 01/07/21 1204      Manual Therapy   Manual Therapy Soft tissue mobilization;Passive ROM    Manual therapy comments performed after TDN    Soft tissue mobilization Performed STM to L wrist/finger extensor group after TDN and to L upper trapezius in supine after TDN to improve tissue mobility and decrease tension.    Passive ROM After TDN and STM performed sequential PROM and AAROM to LUE; focused on PROM to L tricep with shoulder flexion and elbow flexion and then performing gentle AAROM between elbow flexion <> extension with shoulder in flexion x 12 reps.  Then with UE in neutral performed upper trap stretch x 30 seconds with breathing and then added in shoulder IR/forearm pronation <> supination x 30 seconds; final stretch performed upper trap with shoulder IR, forearm pronation and wrist flexion <> extension.            Trigger Point Dry Needling - 01/07/21 0806    Consent Given? Yes    Education Handout Provided Previously provided    Muscles Treated Head and Neck Upper trapezius    Muscles Treated Upper Quadrant Triceps    Muscles Treated Wrist/Hand Extensor carpi radialis longus/brevis;Extensor digitorum    Dry Needling  Comments Performed upper trap in supine on L side; tricep and extensor group on LUE in sitting    Upper Trapezius Response Twitch reponse elicited;Palpable increased muscle length    Triceps Response Twitch response elicited;Palpable increased muscle length    Extensor carpi radialis longus/brevis Response Twitch response elicited;Palpable increased muscle length    Extensor digitorum Response Twitch response elicited;Palpable increased muscle length                     PT Long Term Goals - 12/26/20 1138      PT LONG TERM GOAL #1   Title Pt will report 80% improvement in painful symptoms after a full night's sleep and after a full work day.    Baseline atleast 50% improvement after sleep and working; rarely wakes up in pain  now, less stiffness in the morning.    Time 4    Period Weeks    Status Revised    Target Date 01/25/21      PT LONG TERM GOAL #2   Title Pt will receive pain neuroscience education and will begin to identify ways to reduce catastrophizing thoughts and use of pain acknowledgement scale with activities    Time 4    Period Weeks    Status Revised    Target Date 01/25/21      PT LONG TERM GOAL #3   Title Pt will report return to walking program or use of stationary bike for aerobic conditioning 3-4 days out of the week    Baseline HA limited pt from being able to exercise; tried walking program with wrist weight but exacerbated symptoms.    Time 4    Period Weeks    Status Revised    Target Date 01/25/21      PT LONG TERM GOAL #4   Title Due to decreased pain and improved ability to participate in OT strengthening HEP - pt will demonstrate increase in L grip strength to >/= 60lb    Baseline L: 30lb > 52.6lb    Time 4    Period Weeks    Status Revised    Target Date 01/25/21                 Plan - 01/07/21 1214    Clinical Impression Statement Incorporated trigger point dry needling of L upper trap and L tricep with L extensor muscle group to address whole kinetic chain and mm tension from guarding.  Pt presented with significant tension in L upper trap.  Continued to incorporate STM and combined PROM and AAROM to encourage mobility within new available ROM.    Personal Factors and Comorbidities Comorbidity 3+;Fitness;Profession;Time since onset of injury/illness/exacerbation    Comorbidities allergic rhinitis, GERD, migraine, HLD, IBS, lactose intolerance, vitamin D deficiency    Examination-Activity Limitations Carry;Lift;Sleep    Examination-Participation Restrictions Cleaning;Occupation;Meal Prep    Stability/Clinical Decision Making Evolving/Moderate complexity    Rehab Potential Good    PT Frequency 1x / week    PT Duration 4 weeks    PT Treatment/Interventions  ADLs/Self Care Home Management;Cryotherapy;Electrical Stimulation;Moist Heat;Therapeutic activities;Therapeutic exercise;Neuromuscular re-education;Patient/family education;Manual techniques;Passive range of motion;Dry needling;Taping    PT Next Visit Plan PNE/mirror.  TDN L UT, tricep, extensors.  AAROM/nerve sliders.  Mobilization with movement.  Walking or bike for aerobic.    Consulted and Agree with Plan of Care Patient           Patient will benefit from skilled therapeutic intervention in order to improve  the following deficits and impairments:  Decreased activity tolerance,Decreased strength,Increased fascial restricitons,Impaired perceived functional ability,Impaired sensation,Impaired UE functional use,Pain  Visit Diagnosis: Pain in left elbow  Muscle weakness (generalized)  Other abnormalities of gait and mobility     Problem List Patient Active Problem List   Diagnosis Date Noted  . Migraine 11/01/2020  . Vitamin D deficiency 07/14/2018  . Prediabetes 07/14/2018  . NAFLD (nonalcoholic fatty liver disease) 41/96/2229  . Erythema nodosum 02/13/2013  . Acute transverse myelitis (HCC) 02/13/2013    Dierdre Highman, PT, DPT 01/07/21    12:18 PM    [facility] [address] [place], [place], [address] Phone: [phone]   Fax:  [phone]  Name: Heather Moreno MRN: [id number] Date of Birth: 1971/05/05

## 2021-01-07 NOTE — Therapy (Signed)
[place] [address] [place], [place], [address] Phone: [phone]   Fax:  [phone]  Occupational Therapy Treatment  Patient Details  Name: Heather Moreno MRN: [id number] Date of Birth: 06/20/1971 Referring Provider (OT): Dr. Dennard Nip   Encounter Date: 01/07/2021   OT End of Session - 01/07/21 1301    Visit Number 26    Number of Visits 35    Date for OT Re-Evaluation 12/27/20    Authorization Type [employer] 60 visit limit combined OT/PT, covered 100% after deductible met    Authorization Time Period renewal completed 01/02/21  week 1/5 starts next week    OT Start Time 0718    OT Stop Time 0800    OT Time Calculation (min) 42 min    Activity Tolerance Patient tolerated treatment well    Behavior During Therapy [facility] for tasks assessed/performed           Past Medical History:  Diagnosis Date  . Allergic rhinitis   . Complication of anesthesia    difficulty with weakness after spinal  . Environmental allergies   . GERD (gastroesophageal reflux disease)   . Headache(784.0)   . HLD (hyperlipidemia)   . IBS (irritable bowel syndrome)   . Lactose intolerance   . Migraine headache with aura   . Vitamin D deficiency     Past Surgical History:  Procedure Laterality Date  . cesarean sections  2009, 2003  . NASAL SINUS SURGERY Bilateral 1989    There were no vitals filed for this visit.   Subjective Assessment - 01/07/21 1310    Subjective  Pt reports pain 2-3/10    Pertinent History L tennis elbow/lateral elbow pain.        PMH:  hyperlipidemia, Vitamin D Deficiency, hx of headaches/migraine, hx of transverse myelitis    Patient Stated Goals improve pain    Currently in Pain? Yes    Pain Score 3     Pain Location Elbow    Pain Orientation Left    Pain Descriptors / Indicators Aching    Pain Type Chronic pain    Pain Onset More than a month ago    Pain Frequency Intermittent    Aggravating Factors   overuse    Pain Relieving Factors rest, heat massage                  Treatment:Ultrasound x 40mn to L lateral elbow/dorsal forarm 343m, continuous, 0.8 wts/cm2 with no adverse reactions.  Soft tissue massage to dorsal forearm/elbow, as well as joint mobs to elbow / radial head, with(distraction lateral motion and gentle supination/ pronation)  Reviewed isometric exercises for elbow and gentle stretches 5 reps each, min v.c for shoulder positioning               OT Education - 01/07/21 1254    Education Details reveiwed isometic exercises for wrist and  elbow and gentle stretch 5 reps each , followed by yellow theraband shoulder flexion and extension, 10-15 reps therapist instructed patient in elbow strengthening exercises for lateral epicondylitis(Page 2 of strengthening exercises) 10-15 reps each    Person(s) Educated Patient    Methods Explanation;Demonstration;Handout;Verbal cues    Comprehension Verbalized understanding;Returned demonstration;Verbal cues required            OT Short Term Goals - 01/03/21 0807      OT SHORT TERM GOAL #1   Title Pt will be independent with stretching HEP.--check STGs 10/16/20    Time  4    Period Weeks    Status Achieved      OT SHORT TERM GOAL #2   Title Pt will be independent with activity modifications/improved positioning for ADLs and IADLs to decr pain.    Time 4    Period Weeks    Status Achieved      OT SHORT TERM GOAL #3   Title Pt will be indepedent with splint and counterforce band wear/care.    Time 4    Period Weeks    Status Achieved   verbalizes understanding     OT SHORT TERM GOAL #4   Title Pt will report pain consistently less than or equal to 3/10 for light ADLs/IADLs.    Time 4    Period Weeks    Status Achieved   up to 4/10 at times.  11/20/20:  not consistent yet            OT Long Term Goals - 01/03/21 0807      OT LONG TERM GOAL #1   Title Pt will be indepedent with strengthening  HEP.    Time 5    Status On-going      OT LONG TERM GOAL #2   Title Pt will be able to lift dishes with LUE without pain.    Time 5    Period Weeks    Status On-going   does not consistently use LUE for theis task due to fear of pain     OT LONG TERM GOAL #3   Title Pt will be able to perform supination with elbow extended to assist with patient evalutions without pain.    Status Achieved      OT LONG TERM GOAL #4   Title Pt will report pain consistently less than or equal to 1/10 for typical ADLs/IADLs including sleep.    Time 5    Period Weeks    Status On-going   2-3/10     OT LONG TERM GOAL #5   Title Pt will demo at least 40lbs L grip strength for ADLs/IADLs.    Status Achieved   70.1 with pain     Long Term Additional Goals   Additional Long Term Goals Yes      OT LONG TERM GOAL #6   Title Pt will demonstrate ability to retrieve a lightweight object from overhead shelf with out stretched arm and wrist extension with pain no greater than 2/10.    Time 5    Period Weeks    Status New                 Plan - 01/07/21 1302    Clinical Impression Statement Pt is progressing towards goals. She continues to demonstrate mild pain however she is tolerating gentle strengthening.    OT Occupational Profile and History Problem Focused Assessment - Including review of records relating to presenting problem    Occupational performance deficits (Please refer to evaluation for details): ADL's;IADL's;Work;Leisure;Rest and Sleep    Body Structure / Function / Physical Skills ADL;Strength;UE functional use;Decreased knowledge of use of DME;Decreased knowledge of precautions;Pain;Body mechanics;IADL    Rehab Potential Good    OT Frequency 2x / week    OT Duration --   5 weeks or 10 visits   OT Treatment/Interventions Self-care/ADL training;Moist Heat;Fluidtherapy;DME and/or AE instruction;Splinting;Therapeutic activities;Contrast Bath;Ultrasound;Therapeutic exercise;Passive range  of motion;Iontophoresis;Cryotherapy;Electrical Stimulation;Paraffin;Manual Therapy;Patient/family education    Plan continue towards unmet goals; continue with modalities [place], massage, progress strengthening as able  Consulted and Agree with Plan of Care Patient           Patient will benefit from skilled therapeutic intervention in order to improve the following deficits and impairments:   Body Structure / Function / Physical Skills: ADL,Strength,UE functional use,Decreased knowledge of use of DME,Decreased knowledge of precautions,Pain,Body mechanics,IADL       Visit Diagnosis: Pain in left elbow  Muscle weakness (generalized)  Other abnormalities of gait and mobility    Problem List Patient Active Problem List   Diagnosis Date Noted  . Migraine 11/01/2020  . Vitamin D deficiency 07/14/2018  . Prediabetes 07/14/2018  . NAFLD (nonalcoholic fatty liver disease) 07/14/2018  . Erythema nodosum 02/13/2013  . Acute transverse myelitis ([place]) 02/13/2013    RINE,KATHRYN 01/07/2021, 1:12 PM  [facility] [address] [place], [place], [address] Phone: [phone]   Fax:  [phone]  Name: Heather Moreno MRN: [id number] Date of Birth: Feb 22, 1971

## 2021-01-14 ENCOUNTER — Encounter: Payer: Self-pay | Admitting: Physical Therapy

## 2021-01-14 ENCOUNTER — Ambulatory Visit: Payer: 59 | Admitting: Occupational Therapy

## 2021-01-14 ENCOUNTER — Other Ambulatory Visit: Payer: Self-pay

## 2021-01-14 ENCOUNTER — Other Ambulatory Visit (INDEPENDENT_AMBULATORY_CARE_PROVIDER_SITE_OTHER): Payer: Self-pay | Admitting: Family Medicine

## 2021-01-14 ENCOUNTER — Ambulatory Visit: Payer: 59 | Admitting: Physical Therapy

## 2021-01-14 DIAGNOSIS — M25522 Pain in left elbow: Secondary | ICD-10-CM | POA: Diagnosis not present

## 2021-01-14 DIAGNOSIS — M6281 Muscle weakness (generalized): Secondary | ICD-10-CM

## 2021-01-14 DIAGNOSIS — R2689 Other abnormalities of gait and mobility: Secondary | ICD-10-CM

## 2021-01-14 DIAGNOSIS — E7849 Other hyperlipidemia: Secondary | ICD-10-CM

## 2021-01-14 NOTE — Telephone Encounter (Signed)
Would you like to change her 30 day supply with 1 refill to 90 day RX?

## 2021-01-14 NOTE — Therapy (Addendum)
[facility] [address] [place], [place], [address] Phone: [phone]   Fax:  [phone]  Occupational Therapy Treatment  Patient Details  Name: Heather Moreno MRN: [id number] Date of Birth: 12-09-1970 Referring Provider (OT): Dr. Dennard Nip   Encounter Date: 01/14/2021   OT End of Session - 01/14/21 0919    Visit Number 27    Number of Visits 35    Date for OT Re-Evaluation 12/27/20    Authorization Type [employer] 60 visit limit combined OT/PT, covered 100% after deductible met    Authorization Time Period --    OT Start Time 0718    OT Stop Time 0758    OT Time Calculation (min) 40 min    Activity Tolerance Patient tolerated treatment well    Behavior During Therapy [facility] for tasks assessed/performed           Past Medical History:  Diagnosis Date  . Allergic rhinitis   . Complication of anesthesia    difficulty with weakness after spinal  . Environmental allergies   . GERD (gastroesophageal reflux disease)   . Headache(784.0)   . HLD (hyperlipidemia)   . IBS (irritable bowel syndrome)   . Lactose intolerance   . Migraine headache with aura   . Vitamin D deficiency     Past Surgical History:  Procedure Laterality Date  . cesarean sections  2009, 2003  . NASAL SINUS SURGERY Bilateral 1989    There were no vitals filed for this visit.   Subjective Assessment - 01/14/21 0739    Subjective  Pt reports continued pain 3/10    Pertinent History L tennis elbow/lateral elbow pain.        PMH:  hyperlipidemia, Vitamin D Deficiency, hx of headaches/migraine, hx of transverse myelitis    Patient Stated Goals improve pain    Currently in Pain? Yes    Pain Score 3     Pain Location Elbow    Pain Orientation Left    Pain Descriptors / Indicators Aching    Pain Type Chronic pain    Pain Onset More than a month ago    Pain Frequency Intermittent    Aggravating Factors  overuse    Pain Relieving Factors rest  heat, massage                     Treatment:Ultrasound  to L lateral elbow/dorsal forearm 63mz, continuous, 0.8 wts/cm2 with no adverse reactions.(initally for 3 mins however [employer] shut off unexpectedly, pt was in no distress and no pain) a second [employer] was used for the same parameters x 580ms, no adverse reactions.  Soft tissue massage to dorsal forearm/elbow, as well as joint mobs to elbow / radial head, with(distraction lateral motion and gentle supination/ pronation)  Reviewed isometric exercises for elbow and gentle stretches 5 reps each, min v.c for shoulder positioning as well as isometrics for wrist, followed by A/ROM elbow flexion and extension unweighted and then supination/ pronation with 2 lbs weight  Pt performed page # 2 of lateral epicondylitis strengthening exercises 10 reps each and shoulder flexion and extension with red theraband, min v.c for shoulder positioning             OT Short Term Goals - 01/03/21 0807      OT SHORT TERM GOAL #1   Title Pt will be independent with stretching HEP.--check STGs 10/16/20    Time 4    Period Weeks    Status  Achieved      OT SHORT TERM GOAL #2   Title Pt will be independent with activity modifications/improved positioning for ADLs and IADLs to decr pain.    Time 4    Period Weeks    Status Achieved      OT SHORT TERM GOAL #3   Title Pt will be indepedent with splint and counterforce band wear/care.    Time 4    Period Weeks    Status Achieved   verbalizes understanding     OT SHORT TERM GOAL #4   Title Pt will report pain consistently less than or equal to 3/10 for light ADLs/IADLs.    Time 4    Period Weeks    Status Achieved   up to 4/10 at times.  11/20/20:  not consistent yet            OT Long Term Goals - 01/03/21 0807      OT LONG TERM GOAL #1   Title Pt will be indepedent with strengthening HEP.    Time 5    Status On-going      OT LONG TERM GOAL #2   Title Pt will be able  to lift dishes with LUE without pain.    Time 5    Period Weeks    Status On-going   does not consistently use LUE for theis task due to fear of pain     OT LONG TERM GOAL #3   Title Pt will be able to perform supination with elbow extended to assist with patient evalutions without pain.    Status Achieved      OT LONG TERM GOAL #4   Title Pt will report pain consistently less than or equal to 1/10 for typical ADLs/IADLs including sleep.    Time 5    Period Weeks    Status On-going   2-3/10     OT LONG TERM GOAL #5   Title Pt will demo at least 40lbs L grip strength for ADLs/IADLs.    Status Achieved   70.1 with pain     Long Term Additional Goals   Additional Long Term Goals Yes      OT LONG TERM GOAL #6   Title Pt will demonstrate ability to retrieve a lightweight object from overhead shelf with out stretched arm and wrist extension with pain no greater than 2/10.    Time 5    Period Weeks    Status New                 Plan - 01/14/21 0920    Clinical Impression Statement Pt is progressing  slowly towards goals, overall her pain is improved. Pt is progressing with shoulder strengthening however she has not yet progressed to wrist strengthening.    OT Occupational Profile and History Problem Focused Assessment - Including review of records relating to presenting problem    Occupational performance deficits (Please refer to evaluation for details): ADL's;IADL's;Work;Leisure;Rest and Sleep    Body Structure / Function / Physical Skills ADL;Strength;UE functional use;Decreased knowledge of use of DME;Decreased knowledge of precautions;Pain;Body mechanics;IADL    Rehab Potential Good    OT Frequency 2x / week    OT Duration --   x 5 weeks or 10 visits   OT Treatment/Interventions Self-care/ADL training;Moist Heat;Fluidtherapy;DME and/or AE instruction;Splinting;Therapeutic activities;Contrast Bath;Ultrasound;Therapeutic exercise;Passive range of  motion;Iontophoresis;Cryotherapy;Electrical Stimulation;Paraffin;Manual Therapy;Patient/family education    Plan continue towards unmet goals; continue with modalities [place], massage, progress strengthening as able  with wrist strengthening exercises- or can consider gentle shoulder stretches   Consulted and Agree with Plan of Care Patient           Patient will benefit from skilled therapeutic intervention in order to improve the following deficits and impairments:   Body Structure / Function / Physical Skills: ADL,Strength,UE functional use,Decreased knowledge of use of DME,Decreased knowledge of precautions,Pain,Body mechanics,IADL       Visit Diagnosis: Pain in left elbow  Muscle weakness (generalized)  Other abnormalities of gait and mobility    Problem List Patient Active Problem List   Diagnosis Date Noted  . Migraine 11/01/2020  . Vitamin D deficiency 07/14/2018  . Prediabetes 07/14/2018  . NAFLD (nonalcoholic fatty liver disease) 07/14/2018  . Erythema nodosum 02/13/2013  . Acute transverse myelitis ([place]) 02/13/2013    Carvel Huskins 01/14/2021, 9:27 AM Theone Murdoch, OTR/L [phone] Phone: [phone] 4:13 PM 01/14/21 [facility] [address] [place], [place], [address] Phone: [phone]   Fax:  [phone]  Name: Heather Moreno MRN: [id number] Date of Birth: 01/17/1971

## 2021-01-14 NOTE — Telephone Encounter (Signed)
ok 

## 2021-01-15 NOTE — Therapy (Signed)
[facility] [address] [place], [place], [address] Phone: [phone]   Fax:  [phone]  Physical Therapy Treatment  Patient Details  Name: Heather Moreno MRN: [id number] Date of Birth: 05-19-1971 Referring Provider (PT): Rodolph Bong, MD   Encounter Date: 01/14/2021   PT End of Session - 01/15/21 0904    Visit Number 8    Number of Visits 9    Date for PT Re-Evaluation 01/25/21    Authorization Type Aetna    PT Start Time 0805    PT Stop Time 0852    PT Time Calculation (min) 47 min    Equipment Utilized During Treatment Other (comment)   dry needles   Activity Tolerance Patient tolerated treatment well    Behavior During Therapy [facility] for tasks assessed/performed           Past Medical History:  Diagnosis Date  . Allergic rhinitis   . Complication of anesthesia    difficulty with weakness after spinal  . Environmental allergies   . GERD (gastroesophageal reflux disease)   . Headache(784.0)   . HLD (hyperlipidemia)   . IBS (irritable bowel syndrome)   . Lactose intolerance   . Migraine headache with aura   . Vitamin D deficiency     Past Surgical History:  Procedure Laterality Date  . cesarean sections  2009, 2003  . NASAL SINUS SURGERY Bilateral 1989    There were no vitals filed for this visit.   Subjective Assessment - 01/14/21 0820    Subjective Symptoms are the same; pt feels there may be a plateau    Pertinent History allergic rhinitis, GERD, migraine, HLD, IBS, lactose intolerance, vitamin D deficiency    Patient Stated Goals Sleep and wake up with less pain (not sleeping well, sweating, restless, Menopause?)    Currently in Pain? Yes    Pain Score 3     Aggravating Factors  still mainly with elbow flexion                             [facility] Adult PT Treatment/Exercise - 01/15/21 0846      Self-Care   Self-Care Other Self-Care Comments    Other Self-Care Comments   Continued TPE discussing various components of pain including physical, emotional and psychological and the need to address all components.  Pt does feel that tension in shoulder is playing a part in L lateral epicondylitis.  Pt has history of radiculopathy but has not had any recent imaging.  Discussed plan for therapy and therapist recommended science/evidenced based PNE app to address stress related components of pain.   Discussed option of returning to therapy at a later date to address neck/cervical issues.      Manual Therapy   Manual Therapy Joint mobilization;Soft tissue mobilization;Passive ROM;Manual Traction    Manual therapy comments performed after TDN    Joint Mobilization Performed mobilizations with movement to L radial head during supination <> pronation and elbow flexion <> extension through partial ROM.  For supination and pronation facilitated radius spinning medially for pronation and laterally for supination.  For elbow flexion facilitated radius posterior roll and glide on humerus; for extension facilitated radius anterior roll and glide on humerus.    Soft tissue mobilization Performed STM to L wrist/finger extensor group after TDN    Passive ROM Performed after TDN and manual traction to LUE in sitting.  Focused on maintaining stretch of L  upper trapezius muscle while performing AAROM supination<> pronation with elbow extended and then maintaining pronation while performing wrist AAROM into flexion and extension and then maintaining flexion.    Manual Traction To LUE in sitting with elbow in extension; therapist provided facilitation for shoulder depression, and traction at elbow            Trigger Point Dry Needling - 01/15/21 0844    Consent Given? Yes    Education Handout Provided Previously provided    Muscles Treated Wrist/Hand Extensor carpi radialis longus/brevis;Extensor digitorum    Dry Needling Comments Did not wish to address upper trapezius today due to level of  discomfort.  Pt concerned she may tense up or trigger a migraine    Supinator Response Twitch response elicited;Palpable increased muscle length    Extensor carpi radialis longus/brevis Response Twitch response elicited;Palpable increased muscle length    Extensor digitorum Response Twitch response elicited;Palpable increased muscle length                PT Education - 01/15/21 0904    Education Details see self care    Person(s) Educated Patient    Methods Explanation    Comprehension Verbalized understanding               PT Long Term Goals - 12/26/20 1138      PT LONG TERM GOAL #1   Title Pt will report 80% improvement in painful symptoms after a full night's sleep and after a full work day.    Baseline atleast 50% improvement after sleep and working; rarely wakes up in pain now, less stiffness in the morning.    Time 4    Period Weeks    Status Revised    Target Date 01/25/21      PT LONG TERM GOAL #2   Title Pt will receive pain neuroscience education and will begin to identify ways to reduce catastrophizing thoughts and use of pain acknowledgement scale with activities    Time 4    Period Weeks    Status Revised    Target Date 01/25/21      PT LONG TERM GOAL #3   Title Pt will report return to walking program or use of stationary bike for aerobic conditioning 3-4 days out of the week    Baseline HA limited pt from being able to exercise; tried walking program with wrist weight but exacerbated symptoms.    Time 4    Period Weeks    Status Revised    Target Date 01/25/21      PT LONG TERM GOAL #4   Title Due to decreased pain and improved ability to participate in OT strengthening HEP - pt will demonstrate increase in L grip strength to >/= 60lb    Baseline L: 30lb > 52.6lb    Time 4    Period Weeks    Status Revised    Target Date 01/25/21                 Plan - 01/15/21 5537    Clinical Impression Statement Did not address upper trap today  due to intensity of discomfort and tension.  Continued to address forearm/wrist extensor trigger points and tension with trigger point dry needling, joint mobilizations, PROM and AAROM.  Pt tolerated well but pt does feel her symptoms are beginning to plateau.  Plan on performing one more PT treatment session and then D/C.  Discussed possible return to therapy in the future to address  cervical and shoulder impairments that may be affecting lateral epicondylitis.  Also recommended use of evidence based PNE app to address emotional/psychological components to pain perception.    Personal Factors and Comorbidities Comorbidity 3+;Fitness;Profession;Time since onset of injury/illness/exacerbation    Comorbidities allergic rhinitis, GERD, migraine, HLD, IBS, lactose intolerance, vitamin D deficiency    Examination-Activity Limitations Carry;Lift;Sleep    Examination-Participation Restrictions Cleaning;Occupation;Meal Prep    Stability/Clinical Decision Making Evolving/Moderate complexity    Rehab Potential Good    PT Frequency 1x / week    PT Duration 4 weeks    PT Treatment/Interventions ADLs/Self Care Home Management;Cryotherapy;Electrical Stimulation;Moist Heat;Therapeutic activities;Therapeutic exercise;Neuromuscular re-education;Patient/family education;Manual techniques;Passive range of motion;Dry needling;Taping    PT Next Visit Plan Final session for TDN.  Check goals, HEP.  AAROM/nerve sliders.  Mobilization with movement.    Consulted and Agree with Plan of Care Patient           Patient will benefit from skilled therapeutic intervention in order to improve the following deficits and impairments:  Decreased activity tolerance,Decreased strength,Increased fascial restricitons,Impaired perceived functional ability,Impaired sensation,Impaired UE functional use,Pain  Visit Diagnosis: Pain in left elbow  Muscle weakness (generalized)  Other abnormalities of gait and mobility     Problem  List Patient Active Problem List   Diagnosis Date Noted  . Migraine 11/01/2020  . Vitamin D deficiency 07/14/2018  . Prediabetes 07/14/2018  . NAFLD (nonalcoholic fatty liver disease) 28/31/5176  . Erythema nodosum 02/13/2013  . Acute transverse myelitis ([facility]) 02/13/2013    Dierdre Highman, PT, DPT 01/15/21    9:14 AM    [facility] [address] [place], [place], [address] Phone: [phone]   Fax:  [phone]  Name: Heather Moreno MRN: [id number] Date of Birth: [birth year]

## 2021-01-16 ENCOUNTER — Ambulatory Visit: Payer: 59 | Admitting: Occupational Therapy

## 2021-01-16 ENCOUNTER — Other Ambulatory Visit: Payer: Self-pay

## 2021-01-16 DIAGNOSIS — M6281 Muscle weakness (generalized): Secondary | ICD-10-CM

## 2021-01-16 DIAGNOSIS — M25522 Pain in left elbow: Secondary | ICD-10-CM

## 2021-01-16 NOTE — Patient Instructions (Signed)
     Place right hand on wall. Inhaling, turn torso toward left. Hold position for ___ breaths. Repeat ___ times. Repeat with left hand on wall. Do ___ times per day.  Copyright  VHI. All rights reserved.

## 2021-01-16 NOTE — Therapy (Signed)
[facility] [address] [place], [place], [address] Phone: [phone]   Fax:  [phone]  Occupational Therapy Treatment  Patient Details  Name: Heather Moreno MRN: [id number] Date of Birth: 1971-03-10 Referring Provider (OT): Dr. Dennard Nip   Encounter Date: 01/16/2021   OT End of Session - 01/16/21 1511    Visit Number 28    Number of Visits 35    Date for OT Re-Evaluation 02/07/21    Authorization Type [employer] 60 visit limit combined OT/PT, covered 100% after deductible met    Authorization Time Period renewal completed 01/02/21  week 3/5    OT Start Time 0717    OT Stop Time 0801    OT Time Calculation (min) 44 min    Activity Tolerance Patient tolerated treatment well    Behavior During Therapy [facility] for tasks assessed/performed           Past Medical History:  Diagnosis Date  . Allergic rhinitis   . Complication of anesthesia    difficulty with weakness after spinal  . Environmental allergies   . GERD (gastroesophageal reflux disease)   . Headache(784.0)   . HLD (hyperlipidemia)   . IBS (irritable bowel syndrome)   . Lactose intolerance   . Migraine headache with aura   . Vitamin D deficiency     Past Surgical History:  Procedure Laterality Date  . cesarean sections  2009, 2003  . NASAL SINUS SURGERY Bilateral 1989    There were no vitals filed for this visit.   Subjective Assessment - 01/16/21 1508    Subjective  Pt reports continued pain 3-4/10    Pertinent History L tennis elbow/lateral elbow pain.        PMH:  hyperlipidemia, Vitamin D Deficiency, hx of headaches/migraine, hx of transverse myelitis    Patient Stated Goals improve pain    Currently in Pain? Yes    Pain Score 3     Pain Location Elbow    Pain Orientation Left    Pain Descriptors / Indicators Aching    Pain Type Chronic pain    Pain Onset More than a month ago    Pain Frequency Intermittent    Aggravating Factors  mostly  with elbow flexion    Pain Relieving Factors rest, heat, massage            Ultrasound  to L lateral elbow/dorsal forearm 41mz, continuous, 0.8 wts/cm2 with no adverse reactions.  Soft tissue massage to dorsal forearm/elbow, as well as joint mobs to elbow / radial head, with distraction lateral motion and gentle supination/ pronation)  Pt performed page # 2 of lateral epicondylitis strengthening exercises 10 reps each and shoulder flexion and extension with red theraband, min v.c for shoulder positioning.       OT Education - 01/16/21 1508    Education Details Added doorway shoulder ER stretch with elbow bent and wrist strengthening exercises for lateral epicondylitis (supination/pronation, wrist flex/ext with 1lb wt. with elbow at 90* flex, 45* flex, and ext)    Person(s) Educated Patient    Methods Explanation;Demonstration;Handout;Verbal cues    Comprehension Verbalized understanding;Returned demonstration            OT Short Term Goals - 01/03/21 0807      OT SHORT TERM GOAL #1   Title Pt will be independent with stretching HEP.--check STGs 10/16/20    Time 4    Period Weeks    Status Achieved      OT  SHORT TERM GOAL #2   Title Pt will be independent with activity modifications/improved positioning for ADLs and IADLs to decr pain.    Time 4    Period Weeks    Status Achieved      OT SHORT TERM GOAL #3   Title Pt will be indepedent with splint and counterforce band wear/care.    Time 4    Period Weeks    Status Achieved   verbalizes understanding     OT SHORT TERM GOAL #4   Title Pt will report pain consistently less than or equal to 3/10 for light ADLs/IADLs.    Time 4    Period Weeks    Status Achieved   up to 4/10 at times.  11/20/20:  not consistent yet            OT Long Term Goals - 01/14/21 1551      OT LONG TERM GOAL #1   Title Pt will be indepedent with strengthening HEP.    Time 5    Status On-going      OT LONG TERM GOAL #2   Title Pt  will be able to lift dishes with LUE without pain.    Time 5    Period Weeks    Status On-going   does not consistently use LUE for theis task due to fear of pain     OT LONG TERM GOAL #3   Title Pt will be able to perform supination with elbow extended to assist with patient evalutions without pain.    Status Achieved      OT LONG TERM GOAL #4   Title Pt will report pain consistently less than or equal to 1/10 for typical ADLs/IADLs including sleep.    Time 5    Period Weeks    Status On-going   2-3/10     OT LONG TERM GOAL #5   Title Pt will demo at least 40lbs L grip strength for ADLs/IADLs.    Status Achieved   70.1 with pain     OT LONG TERM GOAL #6   Title Pt will demonstrate ability to retrieve a lightweight object from overhead shelf with out stretched arm and wrist extension with pain no greater than 2/10.    Time 5    Period Weeks    Status New                 Plan - 01/16/21 1515    Clinical Impression Statement Pt tolerated progression to wrist strengthening well today with no incr pain.  Continues to report pain with elbow flex.    OT Occupational Profile and History Problem Focused Assessment - Including review of records relating to presenting problem    Occupational performance deficits (Please refer to evaluation for details): ADL's;IADL's;Work;Leisure;Rest and Sleep    Body Structure / Function / Physical Skills ADL;Strength;UE functional use;Decreased knowledge of use of DME;Decreased knowledge of precautions;Pain;Body mechanics;IADL    Rehab Potential Good    OT Frequency 2x / week    OT Duration --   x 5 weeks or 10 visits   OT Treatment/Interventions Self-care/ADL training;Moist Heat;Fluidtherapy;DME and/or AE instruction;Splinting;Therapeutic activities;Contrast Bath;Ultrasound;Therapeutic exercise;Passive range of motion;Iontophoresis;Cryotherapy;Electrical Stimulation;Paraffin;Manual Therapy;Patient/family education    Plan review wrist  strengthening exercises, check goals    Consulted and Agree with Plan of Care Patient           Patient will benefit from skilled therapeutic intervention in order to improve the following deficits and impairments:  Body Structure / Function / Physical Skills: ADL,Strength,UE functional use,Decreased knowledge of use of DME,Decreased knowledge of precautions,Pain,Body mechanics,IADL       Visit Diagnosis: Pain in left elbow  Muscle weakness (generalized)    Problem List Patient Active Problem List   Diagnosis Date Noted  . Migraine 11/01/2020  . Vitamin D deficiency 07/14/2018  . Prediabetes 07/14/2018  . NAFLD (nonalcoholic fatty liver disease) 07/14/2018  . Erythema nodosum 02/13/2013  . Acute transverse myelitis ([place]) 02/13/2013    [facility] 01/16/2021, 3:19 PM  [facility] [address] [place], [place], [address] Phone: [phone]   Fax:  [phone]  Name: Rogene Meth MRN: [id number] Date of Birth: 31-Mar-1971   Vianne Bulls, OTR/L [facility] [address] [place], [place]  [address] [phone] phone [phone] 01/16/21 3:19 PM

## 2021-01-21 ENCOUNTER — Ambulatory Visit: Payer: 59 | Admitting: Occupational Therapy

## 2021-01-21 ENCOUNTER — Ambulatory Visit: Payer: 59 | Admitting: Physical Therapy

## 2021-01-21 ENCOUNTER — Encounter: Payer: Self-pay | Admitting: Physical Therapy

## 2021-01-21 ENCOUNTER — Other Ambulatory Visit: Payer: Self-pay

## 2021-01-21 DIAGNOSIS — M6281 Muscle weakness (generalized): Secondary | ICD-10-CM

## 2021-01-21 DIAGNOSIS — M25522 Pain in left elbow: Secondary | ICD-10-CM

## 2021-01-21 DIAGNOSIS — R2689 Other abnormalities of gait and mobility: Secondary | ICD-10-CM

## 2021-01-21 NOTE — Therapy (Signed)
[facility] [address] [place], [place], [address] Phone: [phone]   Fax:  [phone]  Occupational Therapy Treatment  Patient Details  Name: Heather Moreno MRN: [id number] Date of Birth: [birth year] Referring Provider (OT): Dr. Dennard Nip   Encounter Date: 01/21/2021   OT End of Session - 01/21/21 0855    Visit Number 29    Number of Visits 35    Date for OT Re-Evaluation 02/07/21    Authorization Type [employer] 60 visit limit combined OT/PT, covered 100% after deductible met    Authorization Time Period renewal completed 01/02/21  week 3/5    OT Start Time 0802    OT Stop Time 0847    OT Time Calculation (min) 45 min    Activity Tolerance Patient tolerated treatment well    Behavior During Therapy [facility] for tasks assessed/performed           Past Medical History:  Diagnosis Date  . Allergic rhinitis   . Complication of anesthesia    difficulty with weakness after spinal  . Environmental allergies   . GERD (gastroesophageal reflux disease)   . Headache(784.0)   . HLD (hyperlipidemia)   . IBS (irritable bowel syndrome)   . Lactose intolerance   . Migraine headache with aura   . Vitamin D deficiency     Past Surgical History:  Procedure Laterality Date  . cesarean sections  2009, 2003  . NASAL SINUS SURGERY Bilateral 1989    There were no vitals filed for this visit.   Subjective Assessment - 01/21/21 0854    Subjective  Pt reports 2-4/10 pain last few days.  Pt reports that she feels that she has plateaued    Pertinent History L tennis elbow/lateral elbow pain.        PMH:  hyperlipidemia, Vitamin D Deficiency, hx of headaches/migraine, hx of transverse myelitis    Patient Stated Goals improve pain    Currently in Pain? Yes    Pain Score 2     Pain Location Elbow    Pain Orientation Left    Pain Descriptors / Indicators Aching    Pain Type Chronic pain    Pain Onset More than a month ago    Pain  Frequency Intermittent    Aggravating Factors  mostly with elbow flexion    Pain Relieving Factors rest, heat, massage               Ultrasound  to L lateral elbow/dorsal forearm 34mz, continuous, 0.8 wts/cm2 with no adverse reactions.  Soft tissue massage to dorsal forearm/elbow, as well as joint mobs to elbow/radial head, with distraction lateral motion and gentle supination/ pronation)  Stretching for Wrist extensor from HEP  (passive wrist/finger flex, pronation with elbow ext and lateral head tilt).   Pt performed page # 2 of lateral epicondylitis strengthening exercises 10 reps each and shoulder flexion and extension with red theraband, min v.c for shoulder positioning initially.  Wrist strengthening exercises for lateral epicondylitis (supination/pronation and  wrist flex/ext with 1lb wt. with elbow at 90* flex, 45* flex, and ext) x10 each.    Checked goals and discussed progress and d/c.  Pt/therapist discussed exploring cervical involvement with MD with possible therapy in future prn as well as follow up with Dr. CGeorgina Snell        OT Short Term Goals - 01/03/21 0807      OT SHORT TERM GOAL #1   Title Pt will be independent with stretching HEP.--check  STGs 10/16/20    Time 4    Period Weeks    Status Achieved      OT SHORT TERM GOAL #2   Title Pt will be independent with activity modifications/improved positioning for ADLs and IADLs to decr pain.    Time 4    Period Weeks    Status Achieved      OT SHORT TERM GOAL #3   Title Pt will be indepedent with splint and counterforce band wear/care.    Time 4    Period Weeks    Status Achieved   verbalizes understanding     OT SHORT TERM GOAL #4   Title Pt will report pain consistently less than or equal to 3/10 for light ADLs/IADLs.    Time 4    Period Weeks    Status Achieved   up to 4/10 at times.  11/20/20:  not consistent yet            OT Long Term Goals - 01/21/21 0904      OT LONG TERM GOAL #1   Title  Pt will be indepedent with strengthening HEP.    Time 5    Status Achieved      OT LONG TERM GOAL #2   Title Pt will be able to lift dishes with LUE without pain.    Time 5    Period Weeks    Status Partially Met   does not consistently use LUE for theis task due to fear of pain.  01/21/21:  needs to use RUE for support, uses stepstool for better positioning, and does not lift heavy dishes     OT LONG TERM GOAL #3   Title Pt will be able to perform supination with elbow extended to assist with patient evalutions without pain.    Status Achieved      OT LONG TERM GOAL #4   Title Pt will report pain consistently less than or equal to 1/10 for typical ADLs/IADLs including sleep.    Time 5    Period Weeks    Status Not Met   2-3/10.  01/21/21:  2-4/10 (typically 2-3/10)     OT LONG TERM GOAL #5   Title Pt will demo at least 40lbs L grip strength for ADLs/IADLs.    Status Achieved   70.1 with pain     OT LONG TERM GOAL #6   Title Pt will demonstrate ability to retrieve a lightweight object from overhead shelf with out stretched arm and wrist extension with pain no greater than 2/10.    Time 5    Period Weeks    Status Achieved                 Plan - 01/21/21 6270    Clinical Impression Statement Pt reports that pain is better overall, but is now staying consistent.  Pt has tolerated light strengthening to LUE.  Pt reports that she feels comfortable discharging today and plans to follow up with Dr. Georgina Snell prn.    OT Occupational Profile and History Problem Focused Assessment - Including review of records relating to presenting problem    Occupational performance deficits (Please refer to evaluation for details): ADL's;IADL's;Work;Leisure;Rest and Sleep    Body Structure / Function / Physical Skills ADL;Strength;UE functional use;Decreased knowledge of use of DME;Decreased knowledge of precautions;Pain;Body mechanics;IADL    Rehab Potential Good    OT Frequency 2x / week    OT  Duration --   x 5 weeks  or 10 visits   OT Treatment/Interventions Self-care/ADL training;Moist Heat;Fluidtherapy;DME and/or AE instruction;Splinting;Therapeutic activities;Contrast Bath;Ultrasound;Therapeutic exercise;Passive range of motion;Iontophoresis;Cryotherapy;Electrical Stimulation;Paraffin;Manual Therapy;Patient/family education    Plan d/c OT    Consulted and Agree with Plan of Care Patient           Patient will benefit from skilled therapeutic intervention in order to improve the following deficits and impairments:   Body Structure / Function / Physical Skills: ADL,Strength,UE functional use,Decreased knowledge of use of DME,Decreased knowledge of precautions,Pain,Body mechanics,IADL       Visit Diagnosis: Pain in left elbow  Muscle weakness (generalized)      Problem List Patient Active Problem List   Diagnosis Date Noted  . Migraine 11/01/2020  . Vitamin D deficiency 07/14/2018  . Prediabetes 07/14/2018  . NAFLD (nonalcoholic fatty liver disease) 07/14/2018  . Erythema nodosum 02/13/2013  . Acute transverse myelitis ([place]) 02/13/2013    OCCUPATIONAL THERAPY DISCHARGE SUMMARY  Visits from Start of Care: 29  Current functional level related to goals / functional outcomes: See above    Remaining deficits: Mild pain (2-4/10) with activity, particularly elbow flex and transition from elbow flex to ext strength and ROM improved--pt will continue with HEP   Education / Equipment: Pt educated in HEP, activity modifications, splint/counterforce brace use.  Pt verbalized understanding of education provided.  Plan: Patient agrees to discharge.  Patient goals were met. Patient is being discharged due to being pleased with the current functional level.  Although pt continues to report pain, she reports that it is improved overall and is stable.  Pt to continue with recommendations and follow up with MD prn.????       [facility] 01/21/2021, [facility] [address] [place], [place], [address] Phone: [phone]   Fax:  [phone]  Name: Imo Cumbie MRN: [id number] Date of Birth: [birth year]   Vianne Bulls, OTR/L [facility] [address] [place], [place]  [phone] phone [phone] 01/21/21 9:13 AM

## 2021-01-21 NOTE — Therapy (Signed)
[facility] [address] [place], [place], [address] Phone: [phone]   Fax:  [phone]  Physical Therapy Treatment and D/C Summary  Patient Details  Name: Lovinia Moreno MRN: [id number] Date of Birth: 07-28-1971 Referring Provider (PT): Gregor Hams, MD   Encounter Date: 01/21/2021   PT End of Session - 01/21/21 1220    Visit Number 9    Number of Visits 9    Date for PT Re-Evaluation 01/25/21    Authorization Type [employer]    PT Start Time 0850    PT Stop Time 0929    PT Time Calculation (min) 39 min    Equipment Utilized During Treatment Other (comment)   dry needles   Activity Tolerance Patient tolerated treatment well    Behavior During Therapy [facility] for tasks assessed/performed           Past Medical History:  Diagnosis Date  . Allergic rhinitis   . Complication of anesthesia    difficulty with weakness after spinal  . Environmental allergies   . GERD (gastroesophageal reflux disease)   . Headache(784.0)   . HLD (hyperlipidemia)   . IBS (irritable bowel syndrome)   . Lactose intolerance   . Migraine headache with aura   . Vitamin D deficiency     Past Surgical History:  Procedure Laterality Date  . cesarean sections  2009, 2003  . NASAL SINUS SURGERY Bilateral 1989    There were no vitals filed for this visit.   Subjective Assessment - 01/21/21 0855    Subjective Finished up with OT; has HEP from OT.  Still having mild symptoms at end range elbow flexion.  Symptoms flared a little on Saturday but was busy on Friday preparing for family to visit.    Pertinent History allergic rhinitis, GERD, migraine, HLD, IBS, lactose intolerance, vitamin D deficiency    Patient Stated Goals Sleep and wake up with less pain (not sleeping well, sweating, restless, Menopause?)    Currently in Pain? Yes                             [facility] Adult PT Treatment/Exercise - 01/21/21 1219       Exercises   Exercises Other Exercises    Other Exercises  reviewed with patient how to perform radial nn sliders for LUE in standing; provided pt with picture for HEP.      Manual Therapy   Manual Therapy Soft tissue mobilization    Manual therapy comments performed after TDN    Soft tissue mobilization Performed STM to L wrist/finger extensor group after TDN            Trigger Point Dry Needling - 01/21/21 0859    Consent Given? Yes    Education Handout Provided Previously provided    Muscles Treated Wrist/Hand Extensor carpi radialis longus/brevis;Extensor digitorum    Extensor carpi radialis longus/brevis Response Twitch response elicited;Palpable increased muscle length    Extensor digitorum Response Twitch response elicited;Palpable increased muscle length                PT Education - 01/21/21 1220    Education Details D/C today, indications to return to therapy, radial nn slider for LUE to add to HEP    Person(s) Educated Patient    Methods Explanation;Demonstration;Handout    Comprehension Verbalized understanding;Returned demonstration          Access Code: [email or web address] URL: [email or web address] Date: 01/21/2021  Prepared by: Misty Stanley  Exercises Radial Nerve Flossing - 2 x daily - 7 x weekly - 1 sets - 5 reps      PT Long Term Goals - 01/21/21 1221      PT LONG TERM GOAL #1   Title Pt will report 80% improvement in painful symptoms after a full night's sleep and after a full work day.    Baseline mainly only feeling pain with end range elbow flexion and wrist flexion    Time 4    Period Weeks    Status Achieved      PT LONG TERM GOAL #2   Title Pt will receive pain neuroscience education and will begin to identify ways to reduce catastrophizing thoughts and use of pain acknowledgement scale with activities    Time 4    Period Weeks    Status Deferred      PT LONG TERM GOAL #3   Title Pt will report return to walking program or use  of stationary bike for aerobic conditioning 3-4 days out of the week    Time 4    Period Weeks    Status Not Met      PT LONG TERM GOAL #4   Title Due to decreased pain and improved ability to participate in OT strengthening HEP - pt will demonstrate increase in L grip strength to >/= 60lb    Baseline met during OT session; increased to 70 lb LUE    Time 4    Period Weeks    Status Achieved                 Plan - 01/21/21 1222    Clinical Impression Statement Performed final session with focus on one more dry needling session of L wrist and finger extensor muscle group, STM and educating pt on radial nerve sliders for improved neural mobility through tissue.  Also continued PNE.  Pt demonstrates significant decrease in pain at rest and only reports increase in symptoms with end range elbow flexion and wrist flexion.  Pt also continues to report tension in neck and L shoulder and is planning to discuss with referring physician.  Pt has not re-started an exercise program due to other life responsibilities but plans to look into the [facility] with daughter.  Pt is ready for D/C today.    Personal Factors and Comorbidities Comorbidity 3+;Fitness;Profession;Time since onset of injury/illness/exacerbation    Comorbidities allergic rhinitis, GERD, migraine, HLD, IBS, lactose intolerance, vitamin D deficiency    Examination-Activity Limitations Carry;Lift;Sleep    Examination-Participation Restrictions Cleaning;Occupation;Meal Prep    Stability/Clinical Decision Making Evolving/Moderate complexity    Rehab Potential Good    PT Frequency 1x / week    PT Duration 4 weeks    PT Treatment/Interventions ADLs/Self Care Home Management;Cryotherapy;Electrical Stimulation;Moist Heat;Therapeutic activities;Therapeutic exercise;Neuromuscular re-education;Patient/family education;Manual techniques;Passive range of motion;Dry needling;Taping    Consulted and Agree with Plan of Care Patient            Patient will benefit from skilled therapeutic intervention in order to improve the following deficits and impairments:  Decreased activity tolerance,Decreased strength,Increased fascial restricitons,Impaired perceived functional ability,Impaired sensation,Impaired UE functional use,Pain  Visit Diagnosis: Pain in left elbow  Muscle weakness (generalized)  Other abnormalities of gait and mobility     Problem List Patient Active Problem List   Diagnosis Date Noted  . Migraine 11/01/2020  . Vitamin D deficiency 07/14/2018  . Prediabetes 07/14/2018  . NAFLD (nonalcoholic fatty liver disease)  07/14/2018  . Erythema nodosum 02/13/2013  . Acute transverse myelitis ([place]) 02/13/2013    PHYSICAL THERAPY DISCHARGE SUMMARY  Visits from Start of Care: 9  Current functional level related to goals / functional outcomes: See LTG achievement and impression statement above.   Remaining deficits: Pain in L lateral epicondyle with end range elbow and wrist flexion.  Pain/tension in neck and L shoulder.   Education / Equipment: HEP  Plan: Patient agrees to discharge.  Patient goals were partially met. Patient is being discharged due to being pleased with the current functional level.  ?????     Rico Junker, PT, DPT 01/21/21    12:27 PM    [facility] [address] [place], [place], [address] Phone: [phone]   Fax:  [phone]  Name: Heather Moreno MRN: [id number] Date of Birth: [birth year]

## 2021-01-21 NOTE — Patient Instructions (Signed)
Access Code: [facility] URL: [email or web address] Date: 01/21/2021 Prepared by: Bufford Lope  Exercises Radial Nerve Flossing - 2 x daily - 7 x weekly - 1 sets - 5 reps

## 2021-01-27 ENCOUNTER — Other Ambulatory Visit (INDEPENDENT_AMBULATORY_CARE_PROVIDER_SITE_OTHER): Payer: Self-pay | Admitting: Family Medicine

## 2021-01-27 ENCOUNTER — Other Ambulatory Visit: Payer: Self-pay

## 2021-01-27 ENCOUNTER — Encounter (INDEPENDENT_AMBULATORY_CARE_PROVIDER_SITE_OTHER): Payer: Self-pay | Admitting: Family Medicine

## 2021-01-27 ENCOUNTER — Ambulatory Visit (INDEPENDENT_AMBULATORY_CARE_PROVIDER_SITE_OTHER): Payer: 59 | Admitting: Family Medicine

## 2021-01-27 VITALS — BP 121/75 | HR 78 | Temp 98.0°F | Ht 62.0 in | Wt 154.0 lb

## 2021-01-27 DIAGNOSIS — G43809 Other migraine, not intractable, without status migrainosus: Secondary | ICD-10-CM | POA: Diagnosis not present

## 2021-01-27 DIAGNOSIS — E559 Vitamin D deficiency, unspecified: Secondary | ICD-10-CM

## 2021-01-27 DIAGNOSIS — Z9189 Other specified personal risk factors, not elsewhere classified: Secondary | ICD-10-CM | POA: Diagnosis not present

## 2021-01-27 DIAGNOSIS — E611 Iron deficiency: Secondary | ICD-10-CM | POA: Insufficient documentation

## 2021-01-27 DIAGNOSIS — E7849 Other hyperlipidemia: Secondary | ICD-10-CM

## 2021-01-27 DIAGNOSIS — E669 Obesity, unspecified: Secondary | ICD-10-CM

## 2021-01-27 DIAGNOSIS — E66811 Obesity, class 1: Secondary | ICD-10-CM

## 2021-01-27 DIAGNOSIS — Z6830 Body mass index (BMI) 30.0-30.9, adult: Secondary | ICD-10-CM

## 2021-01-27 DIAGNOSIS — R7303 Prediabetes: Secondary | ICD-10-CM

## 2021-01-27 DIAGNOSIS — L649 Androgenic alopecia, unspecified: Secondary | ICD-10-CM | POA: Insufficient documentation

## 2021-01-27 MED ORDER — ATORVASTATIN CALCIUM 10 MG PO TABS
ORAL_TABLET | ORAL | 1 refills | Status: DC
Start: 2021-01-27 — End: 2021-03-05

## 2021-01-27 MED ORDER — VITAMIN D (ERGOCALCIFEROL) 1.25 MG (50000 UNIT) PO CAPS: 50000.0000 [IU] | ORAL_CAPSULE | ORAL | 1 refills | Status: DC

## 2021-01-27 MED ORDER — METFORMIN HCL 500 MG PO TABS
ORAL_TABLET | ORAL | 1 refills | Status: DC
Start: 2021-01-27 — End: 2021-03-05

## 2021-01-27 MED ORDER — UBRELVY 100 MG PO TABS: ORAL_TABLET | ORAL | 1 refills | Status: DC

## 2021-01-28 LAB — COMPREHENSIVE METABOLIC PANEL
ALT: 31 IU/L (ref 0–32)
AST: 18 IU/L (ref 0–40)
Albumin/Globulin Ratio: 1.5 (ref 1.2–2.2)
Albumin: 4.6 g/dL (ref 3.8–4.8)
Alkaline Phosphatase: 129 IU/L — ABNORMAL HIGH (ref 44–121)
BUN/Creatinine Ratio: 16 (ref 9–23)
BUN: 15 mg/dL (ref 6–24)
Bilirubin Total: 0.3 mg/dL (ref 0.0–1.2)
CO2: 23 mmol/L (ref 20–29)
Calcium: 10.2 mg/dL (ref 8.7–10.2)
Chloride: 99 mmol/L (ref 96–106)
Creatinine, Ser: 0.92 mg/dL (ref 0.57–1.00)
Globulin, Total: 3 g/dL (ref 1.5–4.5)
Glucose: 103 mg/dL — ABNORMAL HIGH (ref 65–99)
Potassium: 5 mmol/L (ref 3.5–5.2)
Sodium: 138 mmol/L (ref 134–144)
Total Protein: 7.6 g/dL (ref 6.0–8.5)
eGFR: 76 mL/min/{1.73_m2} (ref >59)

## 2021-01-28 LAB — LIPID PANEL WITH LDL/HDL RATIO
Cholesterol, Total: 199 mg/dL (ref 100–199)
HDL: 74 mg/dL (ref >39)
LDL Chol Calc (NIH): 103 mg/dL — ABNORMAL HIGH (ref 0–99)
LDL/HDL Ratio: 1.4 ratio (ref 0.0–3.2)
Triglycerides: 129 mg/dL (ref 0–149)
VLDL Cholesterol Cal: 22 mg/dL (ref 5–40)

## 2021-01-28 LAB — HEMOGLOBIN A1C
Est. average glucose Bld gHb Est-mCnc: 134 mg/dL
Hgb A1c MFr Bld: 6.3 % — ABNORMAL HIGH (ref 4.8–5.6)

## 2021-01-28 LAB — INSULIN, RANDOM: INSULIN: 14.3 u[IU]/mL (ref 2.6–24.9)

## 2021-01-28 LAB — VITAMIN D 25 HYDROXY (VIT D DEFICIENCY, FRACTURES): Vit D, 25-Hydroxy: 42.1 ng/mL (ref 30.0–100.0)

## 2021-01-28 NOTE — Progress Notes (Signed)
Chief Complaint:   OBESITY Heather Moreno is here to discuss her progress with her obesity treatment plan along with follow-up of her obesity related diagnoses. Heather Moreno is on keeping a food journal and adhering to recommended goals of 1400-1500 calories and 90+ grams of protein daily and states she is following her eating plan approximately 50% of the time. Heather Moreno states she is doing 0 minutes 0 times per week.  Today's visit was #: 17 Starting weight: 161 lbs Starting date: 06/30/2018 Today's weight: 154 lbs Today's date: 01/27/2021 Total lbs lost to date: 7 Total lbs lost since last in-office visit: 1  Interim History: Heather Moreno continues to do well with avoiding weight gain. She notes increased stress at home and challenges with meal planning for the family, especially her mother in-law struggling with the family.  Subjective:   1. Pre-diabetes Heather Moreno is stable on metformin, and she is struggling more with simple carbohydrates.  2. Other hyperlipidemia Heather Moreno is tolerating Lipitor, and she is working on decreasing cholesterol and diet. She is due to have labs checked.  3. Vitamin D deficiency Heather Moreno is stable on Vit D, and she is due to have labs checked.  4. Other migraine without status migrainosus, not intractable Heather Moreno has had increased migraine frequency recently, and it may be due to increased stress.She notes Nurtec is not helping, but she has had better results with Ubrelvy samples.  5. At risk for heart disease Heather Moreno is at a higher than average risk for cardiovascular disease due to obesity.   Assessment/Plan:   1. Pre-diabetes Heather Moreno will continue to work on weight loss, exercise, and decreasing simple carbohydrates to help decrease the risk of diabetes. We will check labs today, and we will refill metformin for 2 months.  - Comprehensive metabolic panel - Insulin, random - Hemoglobin A1c - metFORMIN (GLUCOPHAGE) 500 MG tablet; Take one tablet by mouth twice a day  Dispense: 60  tablet; Refill: 1  2. Other hyperlipidemia Cardiovascular risk and specific lipid/LDL goals reviewed. We discussed several lifestyle modifications today. We will check labs today, and we will refill Lipitor for 2 months. Heather Moreno will continue to work on diet, exercise and weight loss efforts. Orders and follow up as documented in patient record.   - Lipid Panel With LDL/HDL Ratio - atorvastatin (LIPITOR) 10 MG tablet; TAKE 1 TABLET BY MOUTH EVERYDAY AT BEDTIME  Dispense: 30 tablet; Refill: 1  3. Vitamin D deficiency Low Vitamin D level contributes to fatigue and are associated with obesity, breast, and colon cancer. We will check labs today, and we will refill prescription Vitamin D for 2 months. Heather Moreno will follow-up for routine testing of Vitamin D, at least 2-3 times per year to avoid over-replacement.  - VITAMIN D 25 Hydroxy (Vit-D Deficiency, Fractures) - Vitamin D, Ergocalciferol, (DRISDOL) 1.25 MG (50000 UNIT) CAPS capsule; Take 1 capsule (50,000 Units total) by mouth every 7 (seven) days.  Dispense: 4 capsule; Refill: 1  4. Other migraine without status migrainosus, not intractable Heather Moreno agreed to discontinue Nurtec, and start Ubrelvy 100 mg as needed with 1 refill. She is to follow up with her primary care physician as instructed.  - Ubrogepant (UBRELVY) 100 MG TABS; Take 1 tablet (100mg  total) by mouth daily as needed at onset of migraine.  Dispense: 10 tablet; Refill: 1  5. At risk for heart disease Heather Moreno was given approximately 15 minutes of coronary artery disease prevention counseling today. She is 50 y.o. female and has risk factors for heart disease  including obesity. We discussed intensive lifestyle modifications today with an emphasis on specific weight loss instructions and strategies.   Repetitive spaced learning was employed today to elicit superior memory formation and behavioral change.  6. Class 1 obesity with serious comorbidity and body mass index (BMI) of 30.0 to 30.9  in adult, unspecified obesity type Heather Moreno is currently in the action stage of change. As such, her goal is to continue with weight loss efforts. She has agreed to keeping a food journal and adhering to recommended goals of 1400-1500 calories and 90+ grams of protein daily.   Behavioral modification strategies: increasing lean protein intake, meal planning and cooking strategies and dealing with family or coworker sabotage.  Heather Moreno has agreed to follow-up with our clinic in 4 weeks. She was informed of the importance of frequent follow-up visits to maximize her success with intensive lifestyle modifications for her multiple health conditions.   Heather Moreno was informed we would discuss her lab results at her next visit unless there is a critical issue that needs to be addressed sooner. Heather Moreno agreed to keep her next visit at the agreed upon time to discuss these results.  Objective:   Blood pressure 121/75, pulse 78, temperature 98 F (36.7 C), height 5\' 2"  (1.575 m), weight 154 lb (69.9 kg), SpO2 98 %. Body mass index is 28.17 kg/m.  General: Cooperative, alert, well developed, in no acute distress. HEENT: Conjunctivae and lids unremarkable. Cardiovascular: Regular rhythm.  Lungs: Normal work of breathing. Neurologic: No focal deficits.   Lab Results  Component Value Date   CREATININE 0.92 01/27/2021   BUN 15 01/27/2021   NA 138 01/27/2021   K 5.0 01/27/2021   CL 99 01/27/2021   CO2 23 01/27/2021   Lab Results  Component Value Date   ALT 31 01/27/2021   AST 18 01/27/2021   ALKPHOS 129 (H) 01/27/2021   BILITOT 0.3 01/27/2021   Lab Results  Component Value Date   HGBA1C 6.3 (H) 01/27/2021   HGBA1C 6.0 (H) 08/21/2020   HGBA1C 6.2 (H) 05/22/2020   HGBA1C 6.2 (H) 01/24/2020   HGBA1C 6.0 (H) 11/07/2018   Lab Results  Component Value Date   INSULIN 14.3 01/27/2021   INSULIN 23.0 08/21/2020   INSULIN 27.0 (H) 05/22/2020   INSULIN 28.5 (H) 01/24/2020   INSULIN 16.1 11/07/2018    Lab Results  Component Value Date   TSH 2.460 01/24/2020   Lab Results  Component Value Date   CHOL 199 01/27/2021   HDL 74 01/27/2021   LDLCALC 103 (H) 01/27/2021   TRIG 129 01/27/2021   Lab Results  Component Value Date   WBC 7.2 08/21/2020   HGB 12.6 08/21/2020   HCT 39.1 08/21/2020   MCV 89 08/21/2020   PLT 375 08/21/2020   No results found for: IRON, TIBC, FERRITIN  Attestation Statements:   Reviewed by clinician on day of visit: allergies, medications, problem list, medical history, surgical history, family history, social history, and previous encounter notes.   I, 08/23/2020, am acting as transcriptionist for Burt Knack, MD.  I have reviewed the above documentation for accuracy and completeness, and I agree with the above. -  Quillian Quince, MD

## 2021-02-20 DIAGNOSIS — H5213 Myopia, bilateral: Secondary | ICD-10-CM | POA: Diagnosis not present

## 2021-02-20 DIAGNOSIS — H524 Presbyopia: Secondary | ICD-10-CM | POA: Diagnosis not present

## 2021-02-26 ENCOUNTER — Encounter (INDEPENDENT_AMBULATORY_CARE_PROVIDER_SITE_OTHER): Payer: Self-pay | Admitting: Family Medicine

## 2021-02-26 ENCOUNTER — Ambulatory Visit (INDEPENDENT_AMBULATORY_CARE_PROVIDER_SITE_OTHER): Payer: 59 | Admitting: Family Medicine

## 2021-02-26 ENCOUNTER — Other Ambulatory Visit: Payer: Self-pay

## 2021-02-26 VITALS — BP 119/80 | HR 72 | Temp 98.2°F | Ht 62.0 in | Wt 153.0 lb

## 2021-02-26 DIAGNOSIS — R7303 Prediabetes: Secondary | ICD-10-CM

## 2021-02-26 DIAGNOSIS — E66811 Obesity, class 1: Secondary | ICD-10-CM

## 2021-02-26 DIAGNOSIS — E669 Obesity, unspecified: Secondary | ICD-10-CM

## 2021-02-26 DIAGNOSIS — Z9189 Other specified personal risk factors, not elsewhere classified: Secondary | ICD-10-CM | POA: Diagnosis not present

## 2021-02-26 DIAGNOSIS — Z6830 Body mass index (BMI) 30.0-30.9, adult: Secondary | ICD-10-CM | POA: Diagnosis not present

## 2021-02-26 DIAGNOSIS — E559 Vitamin D deficiency, unspecified: Secondary | ICD-10-CM | POA: Diagnosis not present

## 2021-02-26 DIAGNOSIS — E7849 Other hyperlipidemia: Secondary | ICD-10-CM

## 2021-02-26 DIAGNOSIS — G43C1 Periodic headache syndromes in child or adult, intractable: Secondary | ICD-10-CM

## 2021-03-05 NOTE — Progress Notes (Signed)
Chief Complaint:   OBESITY Heather Moreno is here to discuss her progress with her obesity treatment plan along with follow-up of her obesity related diagnoses. Heather Moreno is on keeping a food journal and adhering to recommended goals of 1400-1500 calories and 90+ grams of protein daily and states she is following her eating plan approximately 60% of the time. Heather Moreno states she is walking and doing strengthening for 40 minutes 2 times per week.  Today's visit was #: 18 Starting weight: 161 lbs Starting date: 06/30/2018 Today's weight: 153 lbs Today's date: 02/26/2021 Total lbs lost to date: 8 Total lbs lost since last in-office visit: 1  Interim History: Heather Moreno continues to work on weight loss. She started at the [employer] with her daughter and she is doing a combination of cardio and strengthening.  Subjective:   1. Other hyperlipidemia Nakota's LDL has greatly improved on Lipitor. No myalgias were noted. I discussed labs with the patient today.  2. Vitamin D deficiency Heather Moreno's Vit D level is improving, but not yet at goal. I discussed labs with the patient today.  3. Pre-diabetes Heather Moreno's A1c continues to worsen even with weight loss, diet, and exercise, as well as metformin. I discussed labs with the patient today.  4. Intractable periodic headache syndrome Heather Moreno started a sample of Qulipta from Dr. Lucia Gaskins and she feels it has done well minimizing her migraines. She requests a refill of this with Bernita Raisin for breakthrough migraines.  5. At risk for diabetes mellitus Heather Moreno is at higher than average risk for developing diabetes due to obesity.   Assessment/Plan:   1. Other hyperlipidemia Cardiovascular risk and specific lipid/LDL goals reviewed. We discussed several lifestyle modifications today. Heather Moreno will continue to work on diet, exercise and weight loss efforts. We will refill Lipitor 10 mg #30 for 1 month. Orders and follow up as documented in patient record.   2. Vitamin D  deficiency Low Vitamin D level contributes to fatigue and are associated with obesity, breast, and colon cancer. We will refill prescription Vitamin D 50,000 IU every week #4 for 1 month. Heather Moreno will follow-up for routine testing of Vitamin D, at least 2-3 times per year to avoid over-replacement.  3. Pre-diabetes Heather Moreno agreed to discontinue metformin, and start Ozempic at 0.25 mg daily with no refills. She will continue to work on weight loss, exercise, and decreasing simple carbohydrates to help decrease the risk of diabetes.   4. Intractable periodic headache syndrome We will refill Ubrelvy 100 mg q daily as needed #30 for 2 months; and we will refill Qulipta 60 mg q daily #30 for 2 months. Heather Moreno will continue to follow up as directed.  5. At risk for diabetes mellitus Heather Moreno was given approximately 15 minutes of diabetes education and counseling today. We discussed intensive lifestyle modifications today with an emphasis on weight loss as well as increasing exercise and decreasing simple carbohydrates in her diet. We also reviewed medication options with an emphasis on risk versus benefit of those discussed.   Repetitive spaced learning was employed today to elicit superior memory formation and behavioral change.  6. Obesity BMI today is 17 Heather Moreno is currently in the action stage of change. As such, her goal is to continue with weight loss efforts. She has agreed to the Category 2 Plan.   Exercise goals: As is.  Behavioral modification strategies: increasing lean protein intake.  Heather Moreno has agreed to follow-up with our clinic in 4 weeks. She was informed of the importance of frequent  follow-up visits to maximize her success with intensive lifestyle modifications for her multiple health conditions.   Objective:   Blood pressure 119/80, pulse 72, temperature 98.2 F (36.8 C), height 5\' 2"  (1.575 m), weight 153 lb (69.4 kg), SpO2 98 %. Body mass index is 27.98 kg/m.  General: Cooperative,  alert, well developed, in no acute distress. HEENT: Conjunctivae and lids unremarkable. Cardiovascular: Regular rhythm.  Lungs: Normal work of breathing. Neurologic: No focal deficits.   Lab Results  Component Value Date   CREATININE 0.92 01/27/2021   BUN 15 01/27/2021   NA 138 01/27/2021   K 5.0 01/27/2021   CL 99 01/27/2021   CO2 23 01/27/2021   Lab Results  Component Value Date   ALT 31 01/27/2021   AST 18 01/27/2021   ALKPHOS 129 (H) 01/27/2021   BILITOT 0.3 01/27/2021   Lab Results  Component Value Date   HGBA1C 6.3 (H) 01/27/2021   HGBA1C 6.0 (H) 08/21/2020   HGBA1C 6.2 (H) 05/22/2020   HGBA1C 6.2 (H) 01/24/2020   HGBA1C 6.0 (H) 11/07/2018   Lab Results  Component Value Date   INSULIN 14.3 01/27/2021   INSULIN 23.0 08/21/2020   INSULIN 27.0 (H) 05/22/2020   INSULIN 28.5 (H) 01/24/2020   INSULIN 16.1 11/07/2018   Lab Results  Component Value Date   TSH 2.460 01/24/2020   Lab Results  Component Value Date   CHOL 199 01/27/2021   HDL 74 01/27/2021   LDLCALC 103 (H) 01/27/2021   TRIG 129 01/27/2021   Lab Results  Component Value Date   WBC 7.2 08/21/2020   HGB 12.6 08/21/2020   HCT 39.1 08/21/2020   MCV 89 08/21/2020   PLT 375 08/21/2020   No results found for: IRON, TIBC, FERRITIN  Attestation Statements:   Reviewed by clinician on day of visit: allergies, medications, problem list, medical history, surgical history, family history, social history, and previous encounter notes.   I, 08/23/2020, am acting as transcriptionist for Burt Knack, MD.  I have reviewed the above documentation for accuracy and completeness, and I agree with the above. -  Quillian Quince, MD

## 2021-03-07 ENCOUNTER — Encounter (INDEPENDENT_AMBULATORY_CARE_PROVIDER_SITE_OTHER): Payer: Self-pay | Admitting: Family Medicine

## 2021-03-10 ENCOUNTER — Encounter (INDEPENDENT_AMBULATORY_CARE_PROVIDER_SITE_OTHER): Payer: Self-pay

## 2021-03-10 ENCOUNTER — Other Ambulatory Visit (INDEPENDENT_AMBULATORY_CARE_PROVIDER_SITE_OTHER): Payer: Self-pay

## 2021-03-10 ENCOUNTER — Telehealth (INDEPENDENT_AMBULATORY_CARE_PROVIDER_SITE_OTHER): Payer: Self-pay

## 2021-03-10 DIAGNOSIS — R7303 Prediabetes: Secondary | ICD-10-CM

## 2021-03-10 MED ORDER — OZEMPIC (0.25 OR 0.5 MG/DOSE) 2 MG/1.5ML ~~LOC~~ SOPN: 0.2500 mg | PEN_INJECTOR | SUBCUTANEOUS | 0 refills | Status: DC

## 2021-03-10 NOTE — Telephone Encounter (Signed)
PA has been initiated via [email or web address] for Ubrelvy 100mg  PO tabs, as needed.  Key: [id number] 100MG  tablets   Form: MedImpact ePA Form 2017 NCPDP Determination: Wait for Questions MedImpact 2017 typically responds with questions in less than 15 minutes, but may take up to 24 hours.

## 2021-03-10 NOTE — Telephone Encounter (Signed)
PA initiated via [email or web address] for Qulipta 60mg .  Key:   PA Case ID: [id number] Status: Sent to [facility] Drug: Qulipta 60MG  tablets Form: MedImpact ePA Form 2017 NCPDP

## 2021-03-11 ENCOUNTER — Other Ambulatory Visit (INDEPENDENT_AMBULATORY_CARE_PROVIDER_SITE_OTHER): Payer: Self-pay | Admitting: Family Medicine

## 2021-03-11 MED ORDER — VITAMIN D (ERGOCALCIFEROL) 1.25 MG (50000 UNIT) PO CAPS: 50000.0000 [IU] | ORAL_CAPSULE | ORAL | 0 refills | Status: DC

## 2021-03-11 MED ORDER — OZEMPIC (0.25 OR 0.5 MG/DOSE) 2 MG/1.5ML ~~LOC~~ SOPN: 0.2500 mg | PEN_INJECTOR | SUBCUTANEOUS | 0 refills | Status: DC

## 2021-03-11 MED ORDER — ATORVASTATIN CALCIUM 10 MG PO TABS: ORAL_TABLET | ORAL | 0 refills | Status: DC

## 2021-03-11 MED ORDER — QULIPTA 60 MG PO TABS: 60.0000 mg | ORAL_TABLET | Freq: Every day | ORAL | 1 refills | Status: DC

## 2021-03-11 MED ORDER — UBRELVY 100 MG PO TABS: ORAL_TABLET | ORAL | 1 refills | Status: DC

## 2021-03-12 ENCOUNTER — Telehealth (INDEPENDENT_AMBULATORY_CARE_PROVIDER_SITE_OTHER): Payer: Self-pay

## 2021-03-12 NOTE — Telephone Encounter (Signed)
PA for Heather Moreno has been denied.  Approval for this medication requires you had a trial of or contraindication (harmful for) to one of the following preventative migraine treatments: divalproex sodium/sodium valproate, topiramate, propranolol, timolol, metoprolol, atenolol, nadolol, amitriptyline, or venlafaxine for approval.This request has been denied because we do not have information that you meet the requirement listed above. Please work with your doctor to use a different medication or get [place] more information if it will allow [place] to approve this request. A written notification letter will follow with additional details.  Pt notified.

## 2021-03-12 NOTE — Telephone Encounter (Signed)
PA has been initiated via [email or web address] for Ubrelvy 100mg  tablets.  Key Rx #: Hansel Starling [id number] 100MG  tablets   Form: MedImpact ePA Form 2017 NCPDP Determination: Wait for Determination Please wait for MedImpact 2017 to return a determination.

## 2021-03-31 ENCOUNTER — Ambulatory Visit (INDEPENDENT_AMBULATORY_CARE_PROVIDER_SITE_OTHER): Payer: 59 | Admitting: Family Medicine

## 2021-04-22 ENCOUNTER — Encounter (INDEPENDENT_AMBULATORY_CARE_PROVIDER_SITE_OTHER): Payer: Self-pay | Admitting: Family Medicine

## 2021-04-23 ENCOUNTER — Ambulatory Visit (INDEPENDENT_AMBULATORY_CARE_PROVIDER_SITE_OTHER): Payer: 59 | Admitting: Family Medicine

## 2021-05-12 ENCOUNTER — Encounter (INDEPENDENT_AMBULATORY_CARE_PROVIDER_SITE_OTHER): Payer: Self-pay | Admitting: Emergency Medicine

## 2021-05-15 ENCOUNTER — Encounter (INDEPENDENT_AMBULATORY_CARE_PROVIDER_SITE_OTHER): Payer: Self-pay

## 2021-05-20 ENCOUNTER — Telehealth (INDEPENDENT_AMBULATORY_CARE_PROVIDER_SITE_OTHER): Payer: Self-pay | Admitting: Emergency Medicine

## 2021-05-20 NOTE — Telephone Encounter (Signed)
Spoke with patient and both rx Heather Moreno and Heather Moreno has went through her [employer] ins no PA was needed. Patient currently have [employer] instead of UMR

## 2021-05-20 NOTE — Telephone Encounter (Signed)
Sec attempt to reach patient. Left a msg on patient machine to give [place] a call to let us know if she have another ins coverage this UMR we have on fill states No Active coverage. Please return our call or send [place] a Mychart msg with the updated ins information.

## 2021-05-22 ENCOUNTER — Other Ambulatory Visit: Payer: Self-pay

## 2021-05-22 ENCOUNTER — Encounter (INDEPENDENT_AMBULATORY_CARE_PROVIDER_SITE_OTHER): Payer: Self-pay | Admitting: Family Medicine

## 2021-05-22 ENCOUNTER — Ambulatory Visit (INDEPENDENT_AMBULATORY_CARE_PROVIDER_SITE_OTHER): Payer: No Typology Code available for payment source | Admitting: Family Medicine

## 2021-05-22 VITALS — BP 110/72 | HR 80 | Temp 98.0°F | Ht 62.0 in | Wt 154.0 lb

## 2021-05-22 DIAGNOSIS — E7849 Other hyperlipidemia: Secondary | ICD-10-CM

## 2021-05-22 DIAGNOSIS — E7800 Pure hypercholesterolemia, unspecified: Secondary | ICD-10-CM | POA: Insufficient documentation

## 2021-05-22 DIAGNOSIS — E559 Vitamin D deficiency, unspecified: Secondary | ICD-10-CM

## 2021-05-22 DIAGNOSIS — R7303 Prediabetes: Secondary | ICD-10-CM | POA: Diagnosis not present

## 2021-05-22 DIAGNOSIS — Z6830 Body mass index (BMI) 30.0-30.9, adult: Secondary | ICD-10-CM

## 2021-05-22 DIAGNOSIS — E669 Obesity, unspecified: Secondary | ICD-10-CM

## 2021-05-22 DIAGNOSIS — G43C1 Periodic headache syndromes in child or adult, intractable: Secondary | ICD-10-CM | POA: Diagnosis not present

## 2021-05-22 DIAGNOSIS — Z9189 Other specified personal risk factors, not elsewhere classified: Secondary | ICD-10-CM

## 2021-05-22 MED ORDER — ATORVASTATIN CALCIUM 10 MG PO TABS: ORAL_TABLET | ORAL | 0 refills | Status: DC

## 2021-05-22 MED ORDER — VITAMIN D (ERGOCALCIFEROL) 1.25 MG (50000 UNIT) PO CAPS: 50000.0000 [IU] | ORAL_CAPSULE | ORAL | 0 refills | Status: DC

## 2021-05-22 MED ORDER — UBRELVY 100 MG PO TABS: 100.0000 mg | ORAL_TABLET | ORAL | 0 refills | Status: DC | PRN

## 2021-05-22 MED ORDER — QULIPTA 60 MG PO TABS: 60.0000 mg | ORAL_TABLET | Freq: Every day | ORAL | 1 refills | Status: DC

## 2021-05-22 MED ORDER — OZEMPIC (0.25 OR 0.5 MG/DOSE) 2 MG/1.5ML ~~LOC~~ SOPN: 0.5000 mg | PEN_INJECTOR | SUBCUTANEOUS | 0 refills | Status: DC

## 2021-05-28 NOTE — Progress Notes (Signed)
Chief Complaint:   OBESITY Heather Moreno is here to discuss her progress with her obesity treatment plan along with follow-up of her obesity related diagnoses. Heather Moreno is on the Category 2 Plan and states she is following her eating plan approximately 50% of the time. Heather Moreno states she is doing 0 minutes 0 times per week.  Today's visit was #: 19 Starting weight: 161 lbs Starting date: 06/30/2018 Today's weight: 154 lbs Today's date: 05/22/2021 Total lbs lost to date: 7 Total lbs lost since last in-office visit: 0  Interim History: Jezelle's last visit was 3 months ago. She has done well with maintaining her weight even with a lot of extra challenges.  Subjective:   1. Hyperlipidemia, pure Heather Moreno is working on diet and weight loss. She is stable on her medications.  2. Vitamin D deficiency Heather Moreno is stable on Vit D, and she denies nausea, vomiting, or muscle weakness.  3. Pre-diabetes Heather Moreno has been on Ozempic at 0.25 mg q weekly for 6 weeks.  4. Intractable periodic headache syndrome Heather Moreno is stable on Ubrelvy  5. At risk for impaired metabolic function Heather Moreno is at increased risk for impaired metabolic function due to decreased protein.  Assessment/Plan:   1. Hyperlipidemia, pure Cardiovascular risk and specific lipid/LDL goals reviewed. We discussed several lifestyle modifications today. We will refill Lipitor for 90 days with no refills. Heather Moreno will continue to work on diet, exercise and weight loss efforts. Orders and follow up as documented in patient record.   - atorvastatin (LIPITOR) 10 MG tablet; TAKE 1 TABLET BY MOUTH EVERYDAY AT BEDTIME  Dispense: 90 tablet; Refill: 0  2. Vitamin D deficiency Low Vitamin D level contributes to fatigue and are associated with obesity, breast, and colon cancer. We will refill prescription Vitamin D for 90 days with no refills. Heather Moreno will follow-up for routine testing of Vitamin D, at least 2-3 times per year to avoid over-replacement.  -  Vitamin D, Ergocalciferol, (DRISDOL) 1.25 MG (50000 UNIT) CAPS capsule; Take 1 capsule (50,000 Units total) by mouth every 7 (seven) days.  Dispense: 12 capsule; Refill: 0  3. Pre-diabetes Heather Moreno will continue to work on weight loss, exercise, and decreasing simple carbohydrates to help decrease the risk of diabetes. Heather Moreno agreed to increase Ozempic to 0.5 mg q weekly and we will refill for 90 days, with no refills.  - Semaglutide,0.25 or 0.5MG /DOS, (OZEMPIC, 0.25 OR 0.5 MG/DOSE,) 2 MG/1.5ML SOPN; Inject 0.5 mg into the skin once a week.  Dispense: 4.5 mL; Refill: 0  4. Intractable periodic headache syndrome We will refill Qulipta for 2 months, and we will refill Ubrelvy for 90 days with no refills. Heather Moreno will continue to follow up as directed.  - Atogepant (QULIPTA) 60 MG TABS; Take 60 mg by mouth daily.  Dispense: 30 tablet; Refill: 1 - Ubrogepant (UBRELVY) 100 MG TABS; Take 100 mg by mouth as needed.  Dispense: 90 tablet; Refill: 0  5. At risk for impaired metabolic function Heather Moreno was given approximately 15 minutes of impaired  metabolic function prevention counseling today. We discussed intensive lifestyle modifications today with an emphasis on specific nutrition and exercise instructions and strategies.   Repetitive spaced learning was employed today to elicit superior memory formation and behavioral change.  6. Obesity with current BMI 28.2 Heather Moreno is currently in the action stage of change. As such, her goal is to continue with weight loss efforts. She has agreed to the Category 2 Plan.   Exercise goals: All adults should avoid  inactivity. Some physical activity is better than none, and adults who participate in any amount of physical activity gain some health benefits.  Behavioral modification strategies: increasing lean protein intake.  Heather Moreno has agreed to follow-up with our clinic in 8 weeks. She was informed of the importance of frequent follow-up visits to maximize her success with  intensive lifestyle modifications for her multiple health conditions.   Objective:   Blood pressure 110/72, pulse 80, temperature 98 F (36.7 C), height 5\' 2"  (1.575 m), weight 154 lb (69.9 kg), SpO2 97 %. Body mass index is 28.17 kg/m.  General: Cooperative, alert, well developed, in no acute distress. HEENT: Conjunctivae and lids unremarkable. Cardiovascular: Regular rhythm.  Lungs: Normal work of breathing. Neurologic: No focal deficits.   Lab Results  Component Value Date   CREATININE 0.92 01/27/2021   BUN 15 01/27/2021   NA 138 01/27/2021   K 5.0 01/27/2021   CL 99 01/27/2021   CO2 23 01/27/2021   Lab Results  Component Value Date   ALT 31 01/27/2021   AST 18 01/27/2021   ALKPHOS 129 (H) 01/27/2021   BILITOT 0.3 01/27/2021   Lab Results  Component Value Date   HGBA1C 6.3 (H) 01/27/2021   HGBA1C 6.0 (H) 08/21/2020   HGBA1C 6.2 (H) 05/22/2020   HGBA1C 6.2 (H) 01/24/2020   HGBA1C 6.0 (H) 11/07/2018   Lab Results  Component Value Date   INSULIN 14.3 01/27/2021   INSULIN 23.0 08/21/2020   INSULIN 27.0 (H) 05/22/2020   INSULIN 28.5 (H) 01/24/2020   INSULIN 16.1 11/07/2018   Lab Results  Component Value Date   TSH 2.460 01/24/2020   Lab Results  Component Value Date   CHOL 199 01/27/2021   HDL 74 01/27/2021   LDLCALC 103 (H) 01/27/2021   TRIG 129 01/27/2021   Lab Results  Component Value Date   VD25OH 42.1 01/27/2021   VD25OH 33.8 08/21/2020   VD25OH 29.8 (L) 05/22/2020   Lab Results  Component Value Date   WBC 7.2 08/21/2020   HGB 12.6 08/21/2020   HCT 39.1 08/21/2020   MCV 89 08/21/2020   PLT 375 08/21/2020   No results found for: IRON, TIBC, FERRITIN  Attestation Statements:   Reviewed by clinician on day of visit: allergies, medications, problem list, medical history, surgical history, family history, social history, and previous encounter notes.   I, 08/23/2020, am acting as transcriptionist for Burt Knack, MD.  I have reviewed  the above documentation for accuracy and completeness, and I agree with the above. -  Quillian Quince, MD

## 2021-06-23 ENCOUNTER — Encounter (INDEPENDENT_AMBULATORY_CARE_PROVIDER_SITE_OTHER): Payer: Self-pay

## 2021-06-24 ENCOUNTER — Encounter (INDEPENDENT_AMBULATORY_CARE_PROVIDER_SITE_OTHER): Payer: Self-pay | Admitting: Emergency Medicine

## 2021-07-17 ENCOUNTER — Encounter (INDEPENDENT_AMBULATORY_CARE_PROVIDER_SITE_OTHER): Payer: Self-pay | Admitting: Family Medicine

## 2021-07-17 ENCOUNTER — Other Ambulatory Visit: Payer: Self-pay

## 2021-07-17 ENCOUNTER — Ambulatory Visit (INDEPENDENT_AMBULATORY_CARE_PROVIDER_SITE_OTHER): Payer: No Typology Code available for payment source | Admitting: Family Medicine

## 2021-07-17 VITALS — BP 100/69 | HR 91 | Temp 98.0°F | Ht 62.0 in | Wt 152.0 lb

## 2021-07-17 DIAGNOSIS — G43C1 Periodic headache syndromes in child or adult, intractable: Secondary | ICD-10-CM | POA: Diagnosis not present

## 2021-07-17 DIAGNOSIS — Z9189 Other specified personal risk factors, not elsewhere classified: Secondary | ICD-10-CM

## 2021-07-17 DIAGNOSIS — E7849 Other hyperlipidemia: Secondary | ICD-10-CM

## 2021-07-17 DIAGNOSIS — E669 Obesity, unspecified: Secondary | ICD-10-CM

## 2021-07-17 DIAGNOSIS — R7303 Prediabetes: Secondary | ICD-10-CM

## 2021-07-17 DIAGNOSIS — E559 Vitamin D deficiency, unspecified: Secondary | ICD-10-CM | POA: Diagnosis not present

## 2021-07-17 DIAGNOSIS — Z6830 Body mass index (BMI) 30.0-30.9, adult: Secondary | ICD-10-CM

## 2021-07-17 MED ORDER — VITAMIN D (ERGOCALCIFEROL) 1.25 MG (50000 UNIT) PO CAPS: 50000.0000 [IU] | ORAL_CAPSULE | ORAL | 0 refills | Status: DC

## 2021-07-17 MED ORDER — QULIPTA 60 MG PO TABS: 60.0000 mg | ORAL_TABLET | Freq: Every day | ORAL | 1 refills | Status: DC

## 2021-07-17 MED ORDER — ATORVASTATIN CALCIUM 10 MG PO TABS: ORAL_TABLET | ORAL | 0 refills | Status: DC

## 2021-07-17 MED ORDER — OZEMPIC (0.25 OR 0.5 MG/DOSE) 2 MG/1.5ML ~~LOC~~ SOPN: 0.5000 mg | PEN_INJECTOR | SUBCUTANEOUS | 0 refills | Status: DC

## 2021-07-18 LAB — CMP14+EGFR
ALT: 22 IU/L (ref 0–32)
AST: 14 IU/L (ref 0–40)
Albumin/Globulin Ratio: 1.7 (ref 1.2–2.2)
Albumin: 4.7 g/dL (ref 3.8–4.8)
Alkaline Phosphatase: 117 IU/L (ref 44–121)
BUN/Creatinine Ratio: 14 (ref 9–23)
BUN: 14 mg/dL (ref 6–24)
Bilirubin Total: 0.4 mg/dL (ref 0.0–1.2)
CO2: 22 mmol/L (ref 20–29)
Calcium: 10 mg/dL (ref 8.7–10.2)
Chloride: 99 mmol/L (ref 96–106)
Creatinine, Ser: 1.02 mg/dL — ABNORMAL HIGH (ref 0.57–1.00)
Globulin, Total: 2.8 g/dL (ref 1.5–4.5)
Glucose: 97 mg/dL (ref 65–99)
Potassium: 5.2 mmol/L (ref 3.5–5.2)
Sodium: 138 mmol/L (ref 134–144)
Total Protein: 7.5 g/dL (ref 6.0–8.5)
eGFR: 67 mL/min/{1.73_m2} (ref >59)

## 2021-07-18 LAB — LIPID PANEL WITH LDL/HDL RATIO
Cholesterol, Total: 204 mg/dL — ABNORMAL HIGH (ref 100–199)
HDL: 77 mg/dL (ref >39)
LDL Chol Calc (NIH): 102 mg/dL — ABNORMAL HIGH (ref 0–99)
LDL/HDL Ratio: 1.3 ratio (ref 0.0–3.2)
Triglycerides: 148 mg/dL (ref 0–149)
VLDL Cholesterol Cal: 25 mg/dL (ref 5–40)

## 2021-07-18 LAB — HEMOGLOBIN A1C
Est. average glucose Bld gHb Est-mCnc: 131 mg/dL
Hgb A1c MFr Bld: 6.2 % — ABNORMAL HIGH (ref 4.8–5.6)

## 2021-07-18 LAB — INSULIN, RANDOM: INSULIN: 27.6 u[IU]/mL — ABNORMAL HIGH (ref 2.6–24.9)

## 2021-07-18 LAB — VITAMIN D 25 HYDROXY (VIT D DEFICIENCY, FRACTURES): Vit D, 25-Hydroxy: 49.8 ng/mL (ref 30.0–100.0)

## 2021-07-21 NOTE — Progress Notes (Signed)
Chief Complaint:   OBESITY Heather Moreno is here to discuss her progress with her obesity treatment plan along with follow-up of her obesity related diagnoses. Heather Moreno is on the Category 2 Plan and states she is following her eating plan approximately 40% of the time. Heather Moreno states she is doing 0 minutes 0 times per week.  Today's visit was #: 20 Starting weight: 161 lbs Starting date: 06/30/2018 Today's weight: 152 lbs Today's date: 07/17/2021 Total lbs lost to date: 9 Total lbs lost since last in-office visit: 2  Interim History: Heather Moreno continues to do well with weight loss. Her oldest daughter is now in college and she is going through this adjustment reasonably well.  Subjective:   1. Pre-diabetes Heather Moreno is stable on Ozempic, and she notes decrease in polyphagia.  2. Other hyperlipidemia Heather Moreno is stable on Lipitor, and she denies chest pain or myalgias.  3. Vitamin D deficiency Heather Moreno is stable on Vit D, and she denies nausea, vomiting, or muscle weakness.  4. Intractable periodic headache syndrome Heather Moreno is stable on Qulipta, and she feels it is helping with migraines. She is only using Ubrelvy rarely.  5. At risk for activity intolerance Heather Moreno is at risk for exercise intolerance due to not exercising.  Assessment/Plan:   1. Pre-diabetes Heather Moreno will continue to work on weight loss, exercise, and decreasing simple carbohydrates to help decrease the risk of diabetes. We will check labs today, and we will refill Ozempic for 2 months.  - Semaglutide,0.25 or 0.5MG/DOS, (OZEMPIC, 0.25 OR 0.5 MG/DOSE,) 2 MG/1.5ML SOPN; Inject 0.5 mg into the skin once a week.  Dispense: 4.5 mL; Refill: 0 - CMP14+EGFR - Insulin, random - Hemoglobin A1c  2. Other hyperlipidemia Cardiovascular risk and specific lipid/LDL goals reviewed.  We discussed several lifestyle modifications today. We will check labs today, and we will refill Lipitor for 90 days with no refills. Heather Moreno will continue to work on diet,  exercise and weight loss efforts. Orders and follow up as documented in patient record.   - atorvastatin (LIPITOR) 10 MG tablet; TAKE 1 TABLET BY MOUTH EVERYDAY AT BEDTIME  Dispense: 90 tablet; Refill: 0 - Lipid Panel With LDL/HDL Ratio  3. Vitamin D deficiency Low Vitamin D level contributes to fatigue and are associated with obesity, breast, and colon cancer. We will check labs today, and we will refill prescription Vitamin D for 90 days with no refills. Heather Moreno will follow-up for routine testing of Vitamin D, at least 2-3 times per year to avoid over-replacement.  - Vitamin D, Ergocalciferol, (DRISDOL) 1.25 MG (50000 UNIT) CAPS capsule; Take 1 capsule (50,000 Units total) by mouth every 7 (seven) days.  Dispense: 12 capsule; Refill: 0 - VITAMIN D 25 Hydroxy (Vit-D Deficiency, Fractures)  4. Intractable periodic headache syndrome Heather Moreno will continue her medications, and we will refill Qulipta for 2 months.  - Atogepant (QULIPTA) 60 MG TABS; Take 60 mg by mouth daily.  Dispense: 30 tablet; Refill: 1  5. At risk for activity intolerance Heather Moreno was given approximately 15 minutes of exercise intolerance counseling today. She is 50 y.o. female and has risk factors exercise intolerance including obesity. We discussed intensive lifestyle modifications today with an emphasis on specific weight loss instructions and strategies. Heather Moreno will slowly increase activity as tolerated.  Repetitive spaced learning was employed today to elicit superior memory formation and behavioral change.  6. Obesity with current BMI 27.8 Heather Moreno is currently in the action stage of change. As such, her goal is to continue with  weight loss efforts. She has agreed to the Category 2 Plan.   Behavioral modification strategies: dealing with family or coworker sabotage.  Heather Moreno has agreed to follow-up with our clinic in 2 months. She was informed of the importance of frequent follow-up visits to maximize her success with intensive  lifestyle modifications for her multiple health conditions.   Heather Moreno was informed we would discuss her lab results at her next visit unless there is a critical issue that needs to be addressed sooner. Heather Moreno agreed to keep her next visit at the agreed upon time to discuss these results.  Objective:   Blood pressure 100/69, pulse 91, temperature 98 F (36.7 C), height '5\' 2"'  (1.575 m), weight 152 lb (68.9 kg), SpO2 97 %. Body mass index is 27.8 kg/m.  General: Cooperative, alert, well developed, in no acute distress. HEENT: Conjunctivae and lids unremarkable. Cardiovascular: Regular rhythm.  Lungs: Normal work of breathing. Neurologic: No focal deficits.   Lab Results  Component Value Date   CREATININE 1.02 (H) 07/17/2021   BUN 14 07/17/2021   NA 138 07/17/2021   K 5.2 07/17/2021   CL 99 07/17/2021   CO2 22 07/17/2021   Lab Results  Component Value Date   ALT 22 07/17/2021   AST 14 07/17/2021   ALKPHOS 117 07/17/2021   BILITOT 0.4 07/17/2021   Lab Results  Component Value Date   HGBA1C 6.2 (H) 07/17/2021   HGBA1C 6.3 (H) 01/27/2021   HGBA1C 6.0 (H) 08/21/2020   HGBA1C 6.2 (H) 05/22/2020   HGBA1C 6.2 (H) 01/24/2020   Lab Results  Component Value Date   INSULIN 27.6 (H) 07/17/2021   INSULIN 14.3 01/27/2021   INSULIN 23.0 08/21/2020   INSULIN 27.0 (H) 05/22/2020   INSULIN 28.5 (H) 01/24/2020   Lab Results  Component Value Date   TSH 2.460 01/24/2020   Lab Results  Component Value Date   CHOL 204 (H) 07/17/2021   HDL 77 07/17/2021   LDLCALC 102 (H) 07/17/2021   TRIG 148 07/17/2021   Lab Results  Component Value Date   VD25OH 49.8 07/17/2021   VD25OH 42.1 01/27/2021   VD25OH 33.8 08/21/2020   Lab Results  Component Value Date   WBC 7.2 08/21/2020   HGB 12.6 08/21/2020   HCT 39.1 08/21/2020   MCV 89 08/21/2020   PLT 375 08/21/2020   No results found for: IRON, TIBC, FERRITIN  Attestation Statements:   Reviewed by clinician on day of visit:  allergies, medications, problem list, medical history, surgical history, family history, social history, and previous encounter notes.   I, Trixie Dredge, am acting as transcriptionist for Dennard Nip, MD.  I have reviewed the above documentation for accuracy and completeness, and I agree with the above. -  Dennard Nip, MD

## 2021-08-13 ENCOUNTER — Other Ambulatory Visit (INDEPENDENT_AMBULATORY_CARE_PROVIDER_SITE_OTHER): Payer: Self-pay | Admitting: Family Medicine

## 2021-08-13 DIAGNOSIS — E559 Vitamin D deficiency, unspecified: Secondary | ICD-10-CM

## 2021-08-13 NOTE — Telephone Encounter (Signed)
Pt last seen by Dr. Beasley.  

## 2021-08-14 NOTE — Telephone Encounter (Signed)
LAST APPOINTMENT DATE: 07/24/21 NEXT APPOINTMENT DATE: 08/19/21   [id number] [place], [place] - [address] [place] [address] Phone: [phone] Fax: [phone]  Patient is requesting a refill of the following medications: Requested Prescriptions   Pending Prescriptions Disp Refills   Vitamin D, Ergocalciferol, (DRISDOL) 1.25 MG (50000 UNIT) CAPS capsule [Pharmacy Med Name: VITAMIN D2 1.25MG (50,000 UNIT)] 12 capsule 0    Sig: TAKE 1 CAPSULE (50,000 UNITS TOTAL) BY MOUTH EVERY 7 (SEVEN) DAYS    Date last filled: 07/24/21 Previously prescribed by [facility]  Lab Results  Component Value Date   HGBA1C 6.2 (H) 07/17/2021   HGBA1C 6.3 (H) 01/27/2021   HGBA1C 6.0 (H) 08/21/2020   Lab Results  Component Value Date   LDLCALC 102 (H) 07/17/2021   CREATININE 1.02 (H) 07/17/2021   Lab Results  Component Value Date   VD25OH 49.8 07/17/2021   VD25OH 42.1 01/27/2021   VD25OH 33.8 08/21/2020    BP Readings from Last 3 Encounters:  07/17/21 100/69  05/22/21 110/72  02/26/21 119/80

## 2021-09-16 ENCOUNTER — Ambulatory Visit (INDEPENDENT_AMBULATORY_CARE_PROVIDER_SITE_OTHER): Payer: No Typology Code available for payment source | Admitting: Family Medicine

## 2021-09-16 ENCOUNTER — Other Ambulatory Visit: Payer: Self-pay

## 2021-09-16 ENCOUNTER — Encounter (INDEPENDENT_AMBULATORY_CARE_PROVIDER_SITE_OTHER): Payer: Self-pay | Admitting: Family Medicine

## 2021-09-16 VITALS — BP 120/78 | HR 83 | Temp 98.5°F | Ht 62.0 in | Wt 154.0 lb

## 2021-09-16 DIAGNOSIS — Z9189 Other specified personal risk factors, not elsewhere classified: Secondary | ICD-10-CM

## 2021-09-16 DIAGNOSIS — G43C1 Periodic headache syndromes in child or adult, intractable: Secondary | ICD-10-CM

## 2021-09-16 DIAGNOSIS — R7303 Prediabetes: Secondary | ICD-10-CM | POA: Diagnosis not present

## 2021-09-16 DIAGNOSIS — E559 Vitamin D deficiency, unspecified: Secondary | ICD-10-CM

## 2021-09-16 DIAGNOSIS — E7849 Other hyperlipidemia: Secondary | ICD-10-CM | POA: Diagnosis not present

## 2021-09-16 DIAGNOSIS — Z6830 Body mass index (BMI) 30.0-30.9, adult: Secondary | ICD-10-CM

## 2021-09-16 DIAGNOSIS — E669 Obesity, unspecified: Secondary | ICD-10-CM

## 2021-09-16 MED ORDER — ATORVASTATIN CALCIUM 10 MG PO TABS: ORAL_TABLET | ORAL | 0 refills | Status: DC

## 2021-09-16 MED ORDER — SEMAGLUTIDE (1 MG/DOSE) 4 MG/3ML ~~LOC~~ SOPN: 1.0000 mg | PEN_INJECTOR | SUBCUTANEOUS | 0 refills | Status: AC

## 2021-09-16 MED ORDER — QULIPTA 60 MG PO TABS: 60.0000 mg | ORAL_TABLET | Freq: Every day | ORAL | 0 refills | Status: AC

## 2021-09-16 MED ORDER — VITAMIN D (ERGOCALCIFEROL) 1.25 MG (50000 UNIT) PO CAPS: 50000.0000 [IU] | ORAL_CAPSULE | ORAL | 0 refills | Status: DC

## 2021-09-16 NOTE — Progress Notes (Signed)
Chief Complaint:   OBESITY Heather Moreno is here to discuss her progress with her obesity treatment plan along with follow-up of her obesity related diagnoses. Heather Moreno is on the Category 2 Plan and states she is following her eating plan approximately 50% of the time. Heather Moreno states she is doing 0 minutes 0 times per week.  Today's visit was #: 21 Starting weight: 161 lbs Starting date: 06/30/2018 Today's weight: 154 lbs Today's date: 09/16/2021 Total lbs lost to date: 7 Total lbs lost since last in-office visit: 0  Interim History: Alishba has had increased stress at work and at home. She hasn't been exercising as much. She notes increased morning hunger which is appropriate.  Subjective:   1. Pre-diabetes Heather Moreno is still struggling with weight loss. She is working on meal planning and prepping, and she is trying to increase her vegetables and decrease simple carbohydrates.  2. Vitamin D deficiency Heather Moreno is stable on Vit D, and she her level is almost at goal.  3. Other hyperlipidemia Heather Moreno's last LDL has greatly improved with Lipitor and diet.  4. Intractable periodic headache syndrome Heather Moreno is stable on her medications, and she notes some constipation but nothing too severe.  5. At risk for diabetes mellitus Heather Moreno is at higher than average risk for developing diabetes due to obesity.   Assessment/Plan:   1. Pre-diabetes Heather Moreno agreed to increase Ozempic to 1 mg q weekly, and we will refill for 90 days, with no refills. She will continue to work on weight loss, exercise, and decreasing simple carbohydrates to help decrease the risk of diabetes.   2. Vitamin D deficiency Low Vitamin D level contributes to fatigue and are associated with obesity, breast, and colon cancer. Heather Moreno will continue prescription Vitamin D 50,000 IU every week #12 and we refill for 90 days, with no refills. She will follow-up for routine testing of Vitamin D, at least 2-3 times per year to avoid  over-replacement.  3. Other hyperlipidemia Cardiovascular risk and specific lipid/LDL goals reviewed. We discussed several lifestyle modifications today. Heather Moreno will continue Lipitor 10 mg qhs #90 with no refills. She will continue to work on diet, exercise and weight loss efforts. Orders and follow up as documented in patient record.   4. Intractable periodic headache syndrome Heather Moreno will continue Qulipta 60 q daily #90 with no refills, and she is to increase her water intake.    5. At risk for diabetes mellitus Heather Moreno was given approximately 15 minutes of diabetes education and counseling today. We discussed intensive lifestyle modifications today with an emphasis on weight loss as well as increasing exercise and decreasing simple carbohydrates in her diet. We also reviewed medication options with an emphasis on risk versus benefit of those discussed.   Repetitive spaced learning was employed today to elicit superior memory formation and behavioral change.  6. Obesity with current BMI 28.2 Heather Moreno is currently in the action stage of change. As such, her goal is to continue with weight loss efforts. She has agreed to the Category 2 Plan.   Behavioral modification strategies: increasing water intake, no skipping meals, and dealing with family or coworker sabotage.  Heather Moreno has agreed to follow-up with our clinic in 8 weeks. She was informed of the importance of frequent follow-up visits to maximize her success with intensive lifestyle modifications for her multiple health conditions.   Objective:   Blood pressure 120/78, pulse 83, temperature 98.5 F (36.9 C), height 5\' 2"  (1.575 m), weight 154 lb (69.9 kg), SpO2  98 %. Body mass index is 28.17 kg/m.  General: Cooperative, alert, well developed, in no acute distress. HEENT: Conjunctivae and lids unremarkable. Cardiovascular: Regular rhythm.  Lungs: Normal work of breathing. Neurologic: No focal deficits.   Lab Results  Component Value Date    CREATININE 1.02 (H) 07/17/2021   BUN 14 07/17/2021   NA 138 07/17/2021   K 5.2 07/17/2021   CL 99 07/17/2021   CO2 22 07/17/2021   Lab Results  Component Value Date   ALT 22 07/17/2021   AST 14 07/17/2021   ALKPHOS 117 07/17/2021   BILITOT 0.4 07/17/2021   Lab Results  Component Value Date   HGBA1C 6.2 (H) 07/17/2021   HGBA1C 6.3 (H) 01/27/2021   HGBA1C 6.0 (H) 08/21/2020   HGBA1C 6.2 (H) 05/22/2020   HGBA1C 6.2 (H) 01/24/2020   Lab Results  Component Value Date   INSULIN 27.6 (H) 07/17/2021   INSULIN 14.3 01/27/2021   INSULIN 23.0 08/21/2020   INSULIN 27.0 (H) 05/22/2020   INSULIN 28.5 (H) 01/24/2020   Lab Results  Component Value Date   TSH 2.460 01/24/2020   Lab Results  Component Value Date   CHOL 204 (H) 07/17/2021   HDL 77 07/17/2021   LDLCALC 102 (H) 07/17/2021   TRIG 148 07/17/2021   Lab Results  Component Value Date   VD25OH 49.8 07/17/2021   VD25OH 42.1 01/27/2021   VD25OH 33.8 08/21/2020   Lab Results  Component Value Date   WBC 7.2 08/21/2020   HGB 12.6 08/21/2020   HCT 39.1 08/21/2020   MCV 89 08/21/2020   PLT 375 08/21/2020   No results found for: IRON, TIBC, FERRITIN  Attestation Statements:   Reviewed by clinician on day of visit: allergies, medications, problem list, medical history, surgical history, family history, social history, and previous encounter notes.   I, Burt Knack, am acting as transcriptionist for Quillian Quince, MD.  I have reviewed the above documentation for accuracy and completeness, and I agree with the above. -  Quillian Quince, MD

## 2021-11-06 ENCOUNTER — Encounter (INDEPENDENT_AMBULATORY_CARE_PROVIDER_SITE_OTHER): Payer: Self-pay

## 2021-11-11 ENCOUNTER — Ambulatory Visit (INDEPENDENT_AMBULATORY_CARE_PROVIDER_SITE_OTHER): Payer: No Typology Code available for payment source | Admitting: Family Medicine

## 2021-11-29 IMAGING — DX DG ELBOW COMPLETE 3+V*L*
4 series · 4 of 4 positions shown · non-contrast
Comparison: None.

CLINICAL DATA: 49-year-old female with left elbow pain

EXAM:
LEFT ELBOW - COMPLETE 3+ VIEW

[elbow ap]
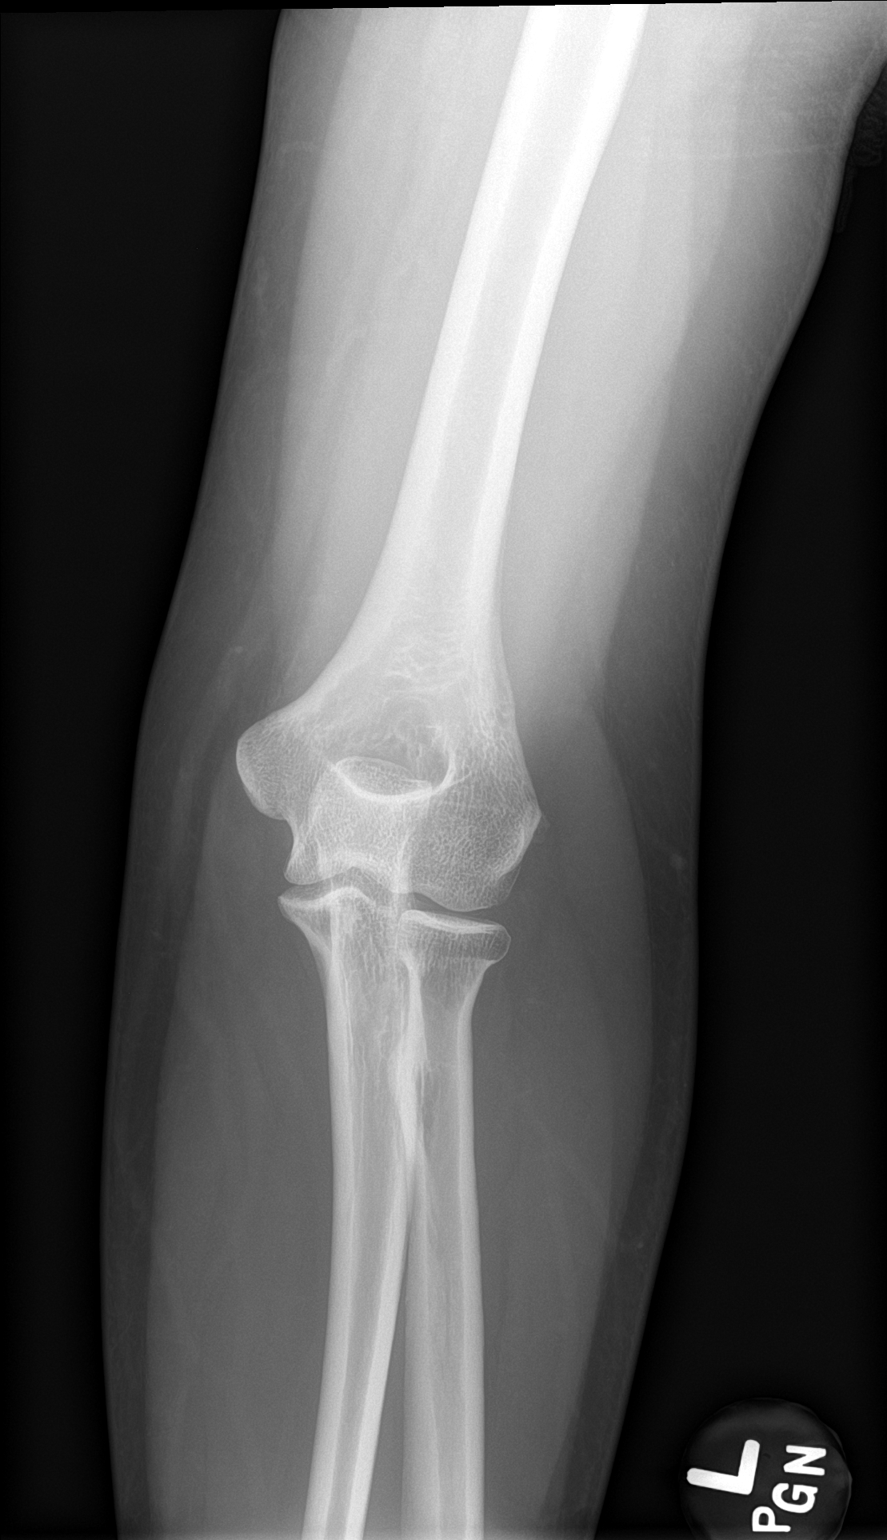

[elbow obl (1 of 2)]
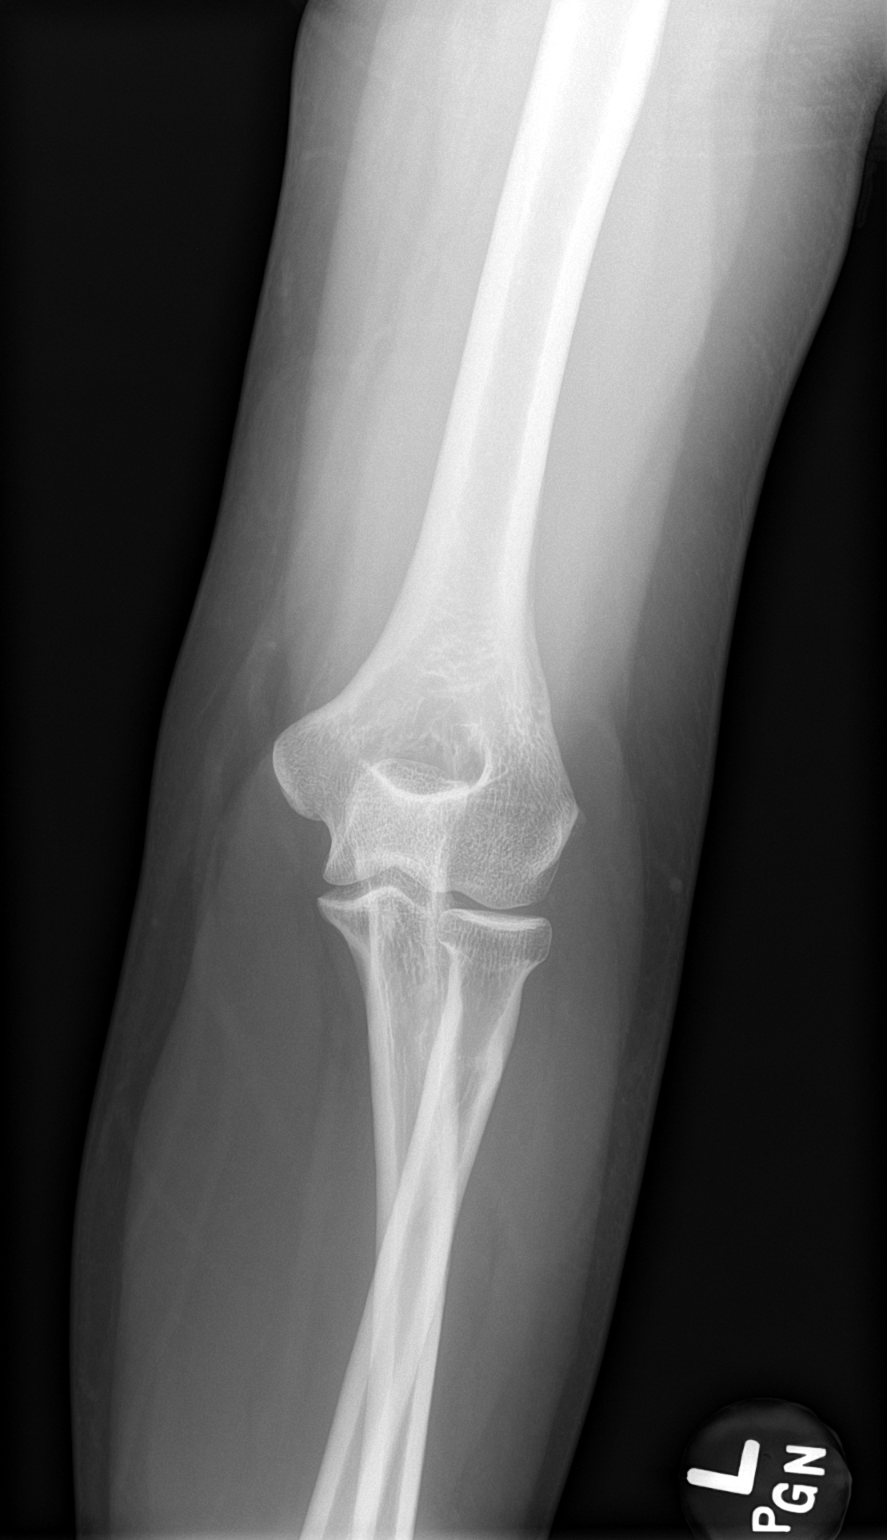

[elbow obl (2 of 2)]
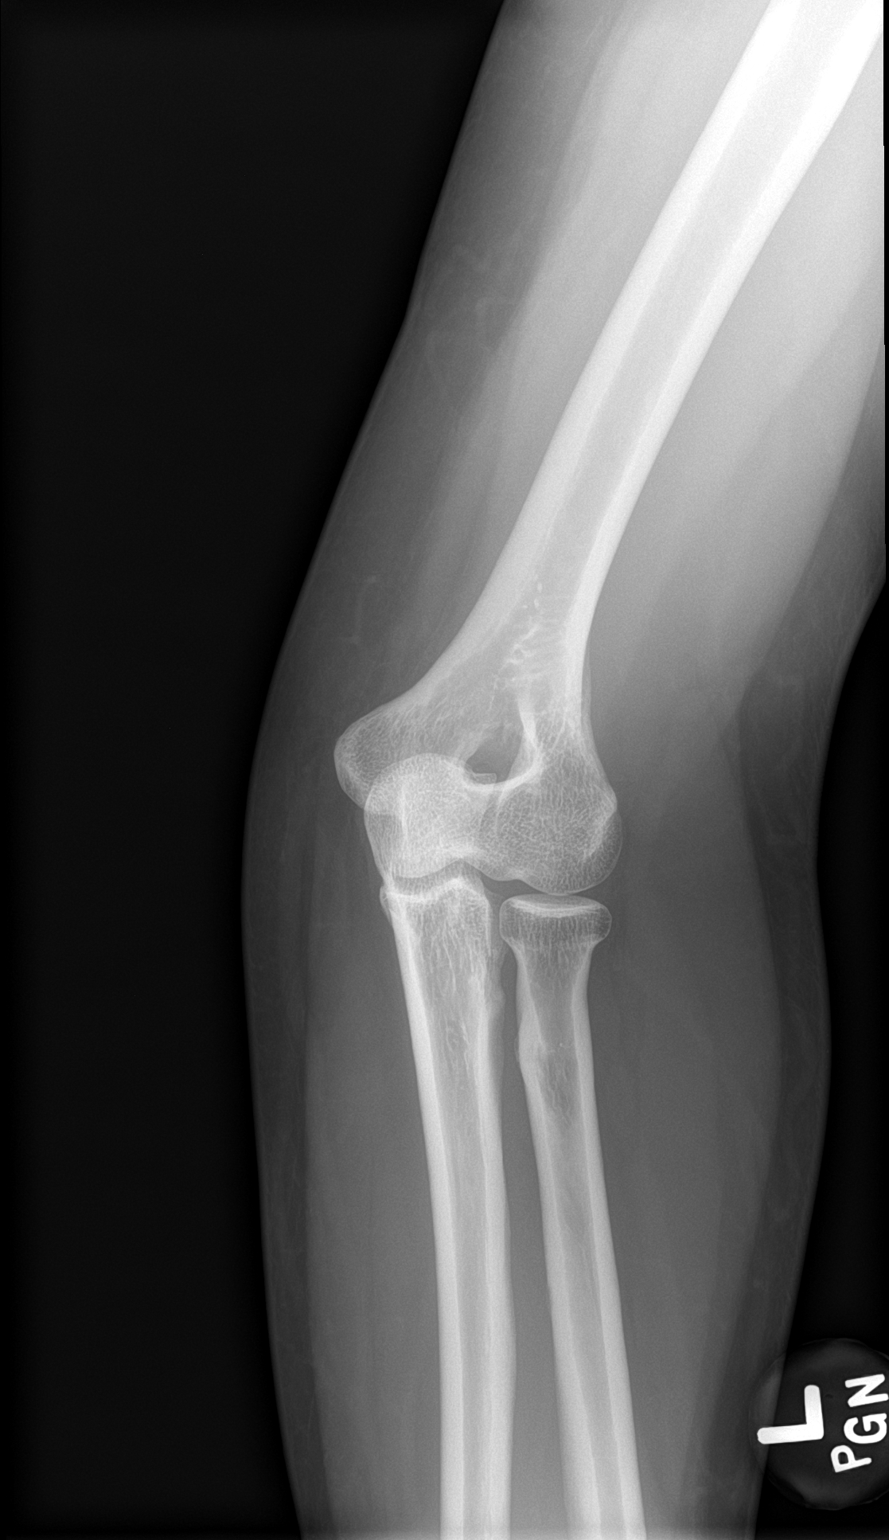

[elbow lat]
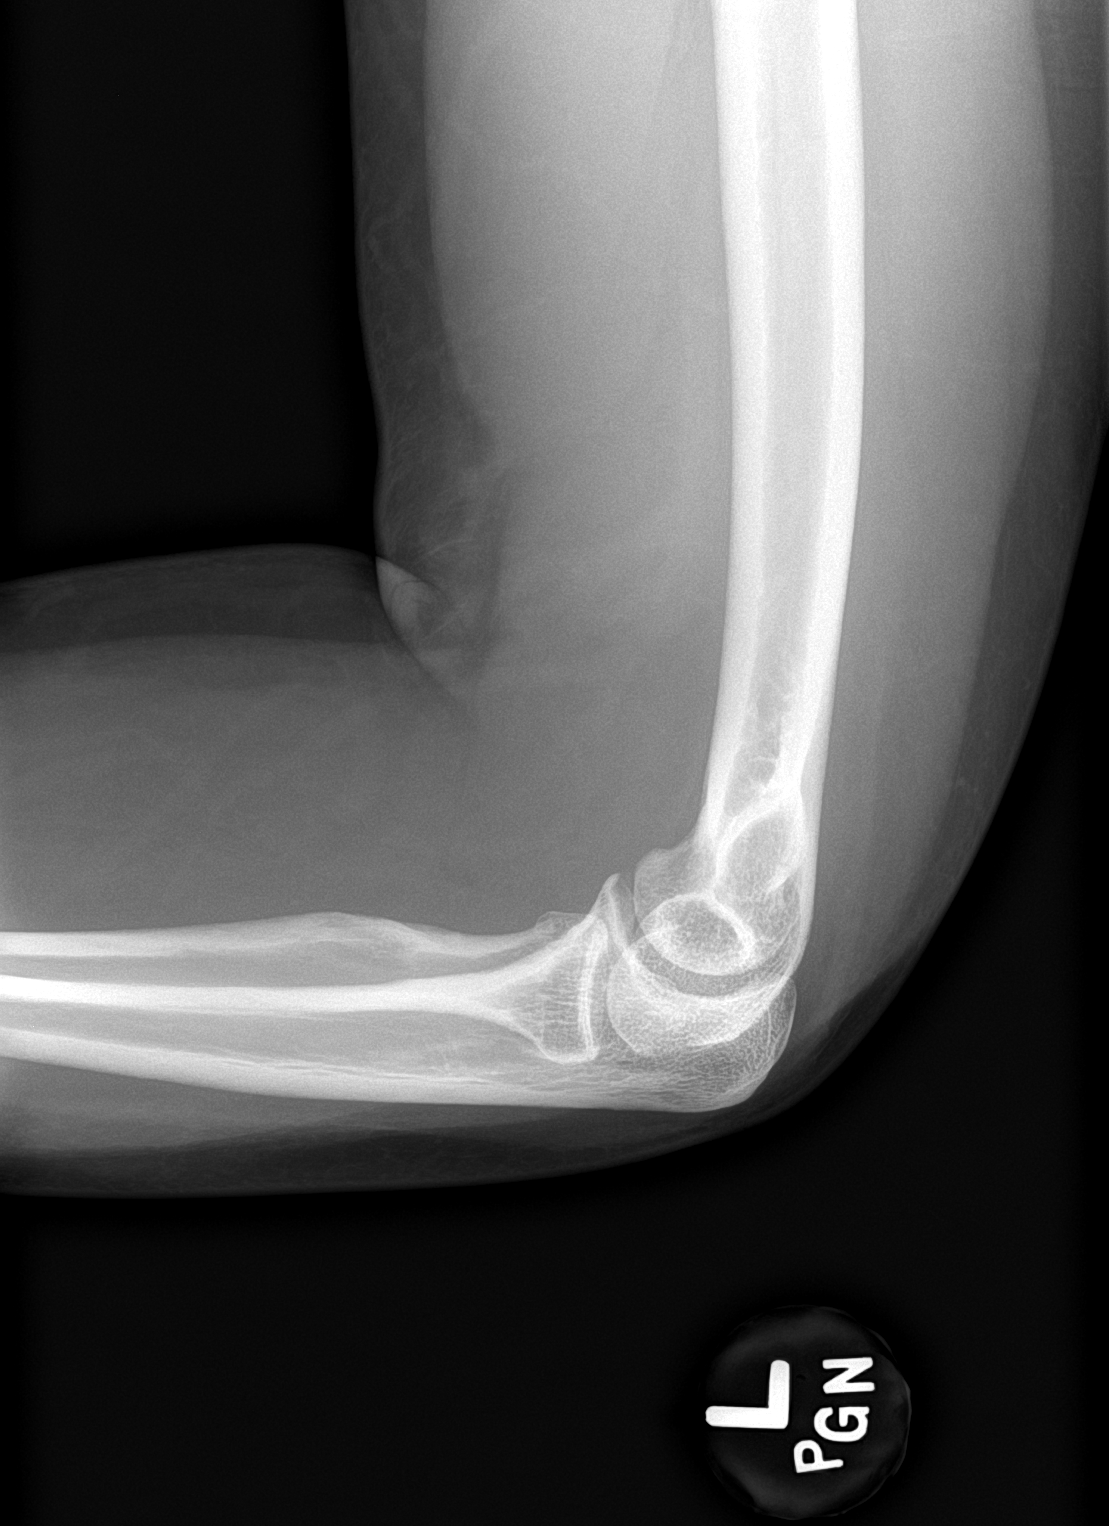

[4 of 4 positions shown; findings below may reference images not displayed]

FINDINGS: There is no evidence of fracture, dislocation, or joint effusion.
There is no evidence of arthropathy or other focal bone abnormality.
Soft tissues are unremarkable.
IMPRESSION: Negative for acute bony abnormality

## 2022-02-05 ENCOUNTER — Other Ambulatory Visit: Payer: Self-pay

## 2022-02-05 ENCOUNTER — Ambulatory Visit (INDEPENDENT_AMBULATORY_CARE_PROVIDER_SITE_OTHER): Payer: No Typology Code available for payment source | Admitting: Family Medicine

## 2022-02-05 ENCOUNTER — Encounter (INDEPENDENT_AMBULATORY_CARE_PROVIDER_SITE_OTHER): Payer: Self-pay | Admitting: Family Medicine

## 2022-02-05 VITALS — BP 121/75 | HR 80 | Temp 98.2°F | Ht 62.0 in | Wt 156.0 lb

## 2022-02-05 DIAGNOSIS — Z6828 Body mass index (BMI) 28.0-28.9, adult: Secondary | ICD-10-CM | POA: Diagnosis not present

## 2022-02-05 DIAGNOSIS — E7849 Other hyperlipidemia: Secondary | ICD-10-CM | POA: Diagnosis not present

## 2022-02-05 DIAGNOSIS — R7303 Prediabetes: Secondary | ICD-10-CM

## 2022-02-05 DIAGNOSIS — Z9189 Other specified personal risk factors, not elsewhere classified: Secondary | ICD-10-CM

## 2022-02-05 DIAGNOSIS — E669 Obesity, unspecified: Secondary | ICD-10-CM

## 2022-02-09 NOTE — Progress Notes (Signed)
? ? ? ?Chief Complaint:  ? ?OBESITY ?Heather Moreno is here to discuss her progress with her obesity treatment plan along with follow-up of her obesity related diagnoses. Heather Moreno is on the Category 2 Plan and states she is following her eating plan approximately 60% of the time. Heather Moreno states she is doing 0 minutes 0 times per week. ? ?Today's visit was #: 24 ?Starting weight: 161 lbs ?Starting date: 06/30/2018 ?Today's weight: 156 lbs ?Today's date: 02/05/2022 ?Total lbs lost to date: 5 ?Total lbs lost since last in-office visit: 0 ? ?Interim History: Graceland's last visit in the office was approximately 5 months ago. We had increased her Ozempic to 1 mg q week at that time for pre-diabetes. She has struggled with exercise since her daughter went to college. Her mother is living with her now although she is traveling a lot. ? ?Subjective:  ? ?1. Pre-diabetes ?Heather Moreno is on Ozempic 1 mg, and she is ready to work on her nutrition.  ? ?2. Other hyperlipidemia ?Heather Moreno is working on diet, and eating to decrease cholesterol. She is due to have labs soon. ? ?3. At risk for diabetes mellitus ?Heather Moreno is at higher than average risk for developing diabetes due to her obesity. ? ?Assessment/Plan:  ? ?1. Pre-diabetes ?Heather Moreno will continue Ozempic 1 mg and continue to work on weight loss, exercise, and decreasing simple carbohydrates to help decrease the risk of diabetes. We will recheck labs in 1 month. ? ?2. Other hyperlipidemia ?Cardiovascular risk and specific lipid/LDL goals reviewed. Heather Moreno will continue to work on diet, exercise and weight loss efforts. We will recheck labs in 1 month. Orders and follow up as documented in patient record.  ? ?3. At risk for diabetes mellitus ?Heather Moreno was given approximately 15 minutes of diabetic education and counseling today. We discussed intensive lifestyle modifications today with an emphasis on weight loss as well as increasing exercise and decreasing simple carbohydrates in her diet. We also reviewed  medication options with an emphasis on risk versus benefits of those discussed. ? ?Repetitive spaced learning was employed today to elicit superior memory formation and behavioral change. ? ?4. Obesity with current BMI 28.5 ?Heather Moreno is currently in the action stage of change. As such, her goal is to continue with weight loss efforts. She has agreed to keeping a food journal and adhering to recommended goals of 1500 calories and 90+ grams of protein daily.  ? ?We will recheck fasting labs at her next visit. ? ?Behavioral modification strategies: increasing lean protein intake. ? ?Heather Moreno has agreed to follow-up with our clinic in 4 weeks. She was informed of the importance of frequent follow-up visits to maximize her success with intensive lifestyle modifications for her multiple health conditions.  ? ?Objective:  ? ?Blood pressure 121/75, pulse 80, temperature 98.2 ?F (36.8 ?C), height 5\' 2"  (1.575 m), weight 156 lb (70.8 kg), SpO2 99 %. ?Body mass index is 28.53 kg/m?. ? ?General: Cooperative, alert, well developed, in no acute distress. ?HEENT: Conjunctivae and lids unremarkable. ?Cardiovascular: Regular rhythm.  ?Lungs: Normal work of breathing. ?Neurologic: No focal deficits.  ? ?Lab Results  ?Component Value Date  ? CREATININE 1.02 (H) 07/17/2021  ? BUN 14 07/17/2021  ? NA 138 07/17/2021  ? K 5.2 07/17/2021  ? CL 99 07/17/2021  ? CO2 22 07/17/2021  ? ?Lab Results  ?Component Value Date  ? ALT 22 07/17/2021  ? AST 14 07/17/2021  ? ALKPHOS 117 07/17/2021  ? BILITOT 0.4 07/17/2021  ? ?Lab Results  ?Component  Value Date  ? HGBA1C 6.2 (H) 07/17/2021  ? HGBA1C 6.3 (H) 01/27/2021  ? HGBA1C 6.0 (H) 08/21/2020  ? HGBA1C 6.2 (H) 05/22/2020  ? HGBA1C 6.2 (H) 01/24/2020  ? ?Lab Results  ?Component Value Date  ? INSULIN 27.6 (H) 07/17/2021  ? INSULIN 14.3 01/27/2021  ? INSULIN 23.0 08/21/2020  ? INSULIN 27.0 (H) 05/22/2020  ? INSULIN 28.5 (H) 01/24/2020  ? ?Lab Results  ?Component Value Date  ? TSH 2.460 01/24/2020  ? ?Lab  Results  ?Component Value Date  ? CHOL 204 (H) 07/17/2021  ? HDL 77 07/17/2021  ? LDLCALC 102 (H) 07/17/2021  ? TRIG 148 07/17/2021  ? ?Lab Results  ?Component Value Date  ? VD25OH 49.8 07/17/2021  ? VD25OH 42.1 01/27/2021  ? VD25OH 33.8 08/21/2020  ? ?Lab Results  ?Component Value Date  ? WBC 7.2 08/21/2020  ? HGB 12.6 08/21/2020  ? HCT 39.1 08/21/2020  ? MCV 89 08/21/2020  ? PLT 375 08/21/2020  ? ?No results found for: IRON, TIBC, FERRITIN ? ?Attestation Statements:  ? ?Reviewed by clinician on day of visit: allergies, medications, problem list, medical history, surgical history, family history, social history, and previous encounter notes. ? ? ?I, Trixie Dredge, am acting as transcriptionist for Dennard Nip, MD. ? ?I have reviewed the above documentation for accuracy and completeness, and I agree with the above. -  Dennard Nip, MD ? ? ?

## 2022-02-16 ENCOUNTER — Encounter (INDEPENDENT_AMBULATORY_CARE_PROVIDER_SITE_OTHER): Payer: Self-pay | Admitting: Family Medicine

## 2022-02-16 DIAGNOSIS — R7303 Prediabetes: Secondary | ICD-10-CM

## 2022-02-16 MED ORDER — SEMAGLUTIDE (1 MG/DOSE) 4 MG/3ML ~~LOC~~ SOPN
1.0000 mg | PEN_INJECTOR | SUBCUTANEOUS | 0 refills | Status: DC
Start: 2022-02-16 — End: 2022-03-23

## 2022-02-16 NOTE — Telephone Encounter (Signed)
Dr.Beasley 

## 2022-02-16 NOTE — Telephone Encounter (Signed)
Please see message and advise.  Thank you. ° °

## 2022-03-04 ENCOUNTER — Ambulatory Visit (INDEPENDENT_AMBULATORY_CARE_PROVIDER_SITE_OTHER): Payer: No Typology Code available for payment source | Admitting: Family Medicine

## 2022-03-17 ENCOUNTER — Other Ambulatory Visit (INDEPENDENT_AMBULATORY_CARE_PROVIDER_SITE_OTHER): Payer: Self-pay | Admitting: Family Medicine

## 2022-03-17 DIAGNOSIS — E559 Vitamin D deficiency, unspecified: Secondary | ICD-10-CM

## 2022-03-23 ENCOUNTER — Ambulatory Visit (INDEPENDENT_AMBULATORY_CARE_PROVIDER_SITE_OTHER): Payer: No Typology Code available for payment source | Admitting: Family Medicine

## 2022-03-23 VITALS — BP 110/71 | HR 81 | Temp 98.1°F | Ht 62.0 in | Wt 154.0 lb

## 2022-03-23 DIAGNOSIS — E669 Obesity, unspecified: Secondary | ICD-10-CM

## 2022-03-23 DIAGNOSIS — Z9189 Other specified personal risk factors, not elsewhere classified: Secondary | ICD-10-CM

## 2022-03-23 DIAGNOSIS — E7849 Other hyperlipidemia: Secondary | ICD-10-CM | POA: Diagnosis not present

## 2022-03-23 DIAGNOSIS — G43809 Other migraine, not intractable, without status migrainosus: Secondary | ICD-10-CM | POA: Diagnosis not present

## 2022-03-23 DIAGNOSIS — Z6824 Body mass index (BMI) 24.0-24.9, adult: Secondary | ICD-10-CM

## 2022-03-23 DIAGNOSIS — R7303 Prediabetes: Secondary | ICD-10-CM

## 2022-03-23 DIAGNOSIS — E559 Vitamin D deficiency, unspecified: Secondary | ICD-10-CM | POA: Diagnosis not present

## 2022-03-24 ENCOUNTER — Encounter (INDEPENDENT_AMBULATORY_CARE_PROVIDER_SITE_OTHER): Payer: Self-pay

## 2022-03-24 LAB — HEMOGLOBIN A1C
Est. average glucose Bld gHb Est-mCnc: 128 mg/dL
Hgb A1c MFr Bld: 6.1 % — ABNORMAL HIGH (ref 4.8–5.6)

## 2022-03-24 LAB — LIPID PANEL WITH LDL/HDL RATIO
Cholesterol, Total: 175 mg/dL (ref 100–199)
HDL: 70 mg/dL (ref >39)
LDL Chol Calc (NIH): 88 mg/dL (ref 0–99)
LDL/HDL Ratio: 1.3 ratio (ref 0.0–3.2)
Triglycerides: 93 mg/dL (ref 0–149)
VLDL Cholesterol Cal: 17 mg/dL (ref 5–40)

## 2022-03-24 LAB — CBC WITH DIFFERENTIAL/PLATELET
Basophils Absolute: 0.1 10*3/uL (ref 0.0–0.2)
Basos: 1 %
EOS (ABSOLUTE): 0.5 10*3/uL — ABNORMAL HIGH (ref 0.0–0.4)
Eos: 7 %
Hematocrit: 39.2 % (ref 34.0–46.6)
Hemoglobin: 13.5 g/dL (ref 11.1–15.9)
Immature Grans (Abs): 0 10*3/uL (ref 0.0–0.1)
Immature Granulocytes: 0 %
Lymphocytes Absolute: 1.7 10*3/uL (ref 0.7–3.1)
Lymphs: 25 %
MCH: 30.2 pg (ref 26.6–33.0)
MCHC: 34.4 g/dL (ref 31.5–35.7)
MCV: 88 fL (ref 79–97)
Monocytes Absolute: 0.5 10*3/uL (ref 0.1–0.9)
Monocytes: 8 %
Neutrophils Absolute: 4.1 10*3/uL (ref 1.4–7.0)
Neutrophils: 59 %
Platelets: 342 10*3/uL (ref 150–450)
RBC: 4.47 x10E6/uL (ref 3.77–5.28)
RDW: 12.7 % (ref 11.7–15.4)
WBC: 6.9 10*3/uL (ref 3.4–10.8)

## 2022-03-24 LAB — CMP14+EGFR
ALT: 35 IU/L — ABNORMAL HIGH (ref 0–32)
AST: 23 IU/L (ref 0–40)
Albumin/Globulin Ratio: 2 (ref 1.2–2.2)
Albumin: 4.7 g/dL (ref 3.8–4.8)
Alkaline Phosphatase: 105 IU/L (ref 44–121)
BUN/Creatinine Ratio: 16 (ref 9–23)
BUN: 15 mg/dL (ref 6–24)
Bilirubin Total: 0.3 mg/dL (ref 0.0–1.2)
CO2: 25 mmol/L (ref 20–29)
Calcium: 9.7 mg/dL (ref 8.7–10.2)
Chloride: 100 mmol/L (ref 96–106)
Creatinine, Ser: 0.95 mg/dL (ref 0.57–1.00)
Globulin, Total: 2.4 g/dL (ref 1.5–4.5)
Glucose: 92 mg/dL (ref 70–99)
Potassium: 5.1 mmol/L (ref 3.5–5.2)
Sodium: 137 mmol/L (ref 134–144)
Total Protein: 7.1 g/dL (ref 6.0–8.5)
eGFR: 73 mL/min/{1.73_m2} (ref >59)

## 2022-03-24 LAB — TSH: TSH: 1.77 u[IU]/mL (ref 0.450–4.500)

## 2022-03-24 LAB — VITAMIN B12: Vitamin B-12: 922 pg/mL (ref 232–1245)

## 2022-03-24 LAB — VITAMIN D 25 HYDROXY (VIT D DEFICIENCY, FRACTURES): Vit D, 25-Hydroxy: 52.2 ng/mL (ref 30.0–100.0)

## 2022-03-24 LAB — INSULIN, RANDOM: INSULIN: 10 u[IU]/mL (ref 2.6–24.9)

## 2022-03-25 MED ORDER — SEMAGLUTIDE (1 MG/DOSE) 4 MG/3ML ~~LOC~~ SOPN: 1.0000 mg | PEN_INJECTOR | SUBCUTANEOUS | 0 refills | Status: DC

## 2022-03-25 MED ORDER — QULIPTA 60 MG PO TABS: 60.0000 mg | ORAL_TABLET | Freq: Every day | ORAL | 0 refills | Status: DC

## 2022-03-25 MED ORDER — VITAMIN D (ERGOCALCIFEROL) 1.25 MG (50000 UNIT) PO CAPS: 50000.0000 [IU] | ORAL_CAPSULE | ORAL | 0 refills | Status: DC

## 2022-03-25 MED ORDER — UBRELVY 100 MG PO TABS: 100.0000 mg | ORAL_TABLET | Freq: Every day | ORAL | 0 refills | Status: DC

## 2022-03-25 MED ORDER — ATORVASTATIN CALCIUM 10 MG PO TABS: ORAL_TABLET | ORAL | 0 refills | Status: DC

## 2022-04-01 NOTE — Progress Notes (Signed)
? ? ? ?Chief Complaint:  ? ?OBESITY ?Heather Moreno is here to discuss her progress with her obesity treatment plan along with follow-up of her obesity related diagnoses. Heather Moreno is on keeping a food journal and adhering to recommended goals of 1500 calories and 90+ grams of protein daily and states she is following her eating plan approximately 65-70% of the time. Heather Moreno states she is doing some walking. ? ?Today's visit was #: 23 ?Starting weight: 161 lbs ?Starting date: 06/30/2018 ?Today's weight: 154 lbs ?Today's date: 03/23/2022 ?Total lbs lost to date: 7 ?Total lbs lost since last in-office visit: 2 ? ?Interim History: Heather Moreno recently finished Ramadan. She didn't fast, but she still ate the traditional supper. She is working on increasing her exercise. She is dealing with a lot of family stress but she is trying to me mindful of her own health needs. ? ?Subjective:  ? ?1. Pre-diabetes ?Heather Moreno is stable on Ozempic, and she notes decreased polyphagia.  ? ?2. Other hyperlipidemia ?Heather Moreno is tolerating Lipitor well. She has had an increase in fried foods last month.  ? ?3. Vitamin D deficiency ?Heather Moreno is due for labs, with no signs of over-replacement.  ? ?4. Other migraine without status migrainosus, not intractable ?Heather Moreno is stable on her migraine  headache medications. She notes 1 headache during Ramadan recently.  ? ?5. At risk for heart disease ?Heather Moreno is at higher than average risk for cardiovascular disease due to obesity. ? ?Assessment/Plan:  ? ?1. Pre-diabetes ?We will check labs today. We will refill Ozempic for 90 days with no refills. ? ?- CMP14+EGFR ?- CBC with Differential/Platelet ?- Insulin, random ?- Hemoglobin A1c ?- TSH ?- Semaglutide, 1 MG/DOSE, 4 MG/3ML SOPN; Inject 1 mg as directed once a week.  Dispense: 3 mL; Refill: 0 ? ?2. Other hyperlipidemia ?We will check labs today. Josefina will continue Lipitor 10 mg qhs, and we will  refill for 90 days with no refills.  ? ?- Lipid Panel With LDL/HDL Ratio ?-  atorvastatin (LIPITOR) 10 MG tablet; TAKE 1 TABLET BY MOUTH EVERYDAY AT BEDTIME  Dispense: 90 tablet; Refill: 0 ? ?3. Vitamin D deficiency ?We will check labs today, and we will refill prescription Vitamin D for 90 days with no refills.  ? ?- Vitamin B12 ?- VITAMIN D 25 Hydroxy (Vit-D Deficiency, Fractures) ?- Vitamin D, Ergocalciferol, (DRISDOL) 1.25 MG (50000 UNIT) CAPS capsule; Take 1 capsule (50,000 Units total) by mouth every 7 (seven) days.  Dispense: 12 capsule; Refill: 0 ? ?4. Other migraine without status migrainosus, not intractable ?We will refill Ubrelvy and Qulipta for 90 days with no refills. ? ?- Ubrogepant (UBRELVY) 100 MG TABS; Take 100 mg by mouth daily.  Dispense: 90 tablet; Refill: 0 ?- Atogepant (QULIPTA) 60 MG TABS; Take 60 mg by mouth daily.  Dispense: 90 tablet; Refill: 0 ? ?5. At risk for heart disease ?Heather Moreno was given approximately 15 minutes of coronary artery disease prevention counseling today. She is 51 y.o. female and has risk factors for heart disease including obesity. We discussed intensive lifestyle modifications today with an emphasis on specific weight loss instructions and strategies. ? ?Repetitive spaced learning was employed today to elicit superior memory formation and behavioral change.  ? ?6. Obesity with current BMI of 24.9 ?Heather Moreno is currently in the action stage of change. As such, her goal is to continue with weight loss efforts. She has agreed to keeping a food journal and adhering to recommended goals of 1500 calories and 90+ grams of protein daily.  ? ?  Exercise goals: As is. ? ?Behavioral modification strategies: increasing lean protein intake. ? ?Heather Moreno has agreed to follow-up with our clinic in 3 weeks. She was informed of the importance of frequent follow-up visits to maximize her success with intensive lifestyle modifications for her multiple health conditions.  ? ?Heather Moreno was informed we would discuss her lab results at her next visit unless there is a critical  issue that needs to be addressed sooner. Heather Moreno agreed to keep her next visit at the agreed upon time to discuss these results. ? ?Objective:  ? ?Blood pressure 110/71, pulse 81, temperature 98.1 ?F (36.7 ?C), temperature source Oral, height '5\' 2"'  (1.575 m), weight 154 lb (69.9 kg), SpO2 99 %. ?Body mass index is 28.17 kg/m?. ? ?General: Cooperative, alert, well developed, in no acute distress. ?HEENT: Conjunctivae and lids unremarkable. ?Cardiovascular: Regular rhythm.  ?Lungs: Normal work of breathing. ?Neurologic: No focal deficits.  ? ?Lab Results  ?Component Value Date  ? CREATININE 0.95 03/23/2022  ? BUN 15 03/23/2022  ? NA 137 03/23/2022  ? K 5.1 03/23/2022  ? CL 100 03/23/2022  ? CO2 25 03/23/2022  ? ?Lab Results  ?Component Value Date  ? ALT 35 (H) 03/23/2022  ? AST 23 03/23/2022  ? ALKPHOS 105 03/23/2022  ? BILITOT 0.3 03/23/2022  ? ?Lab Results  ?Component Value Date  ? HGBA1C 6.1 (H) 03/23/2022  ? HGBA1C 6.2 (H) 07/17/2021  ? HGBA1C 6.3 (H) 01/27/2021  ? HGBA1C 6.0 (H) 08/21/2020  ? HGBA1C 6.2 (H) 05/22/2020  ? ?Lab Results  ?Component Value Date  ? INSULIN 10.0 03/23/2022  ? INSULIN 27.6 (H) 07/17/2021  ? INSULIN 14.3 01/27/2021  ? INSULIN 23.0 08/21/2020  ? INSULIN 27.0 (H) 05/22/2020  ? ?Lab Results  ?Component Value Date  ? TSH 1.770 03/23/2022  ? ?Lab Results  ?Component Value Date  ? CHOL 175 03/23/2022  ? HDL 70 03/23/2022  ? [place] 88 03/23/2022  ? TRIG 93 03/23/2022  ? ?Lab Results  ?Component Value Date  ? VD25OH 52.2 03/23/2022  ? VD25OH 49.8 07/17/2021  ? VD25OH 42.1 01/27/2021  ? ?Lab Results  ?Component Value Date  ? WBC 6.9 03/23/2022  ? HGB 13.5 03/23/2022  ? HCT 39.2 03/23/2022  ? MCV 88 03/23/2022  ? PLT 342 03/23/2022  ? ?No results found for: IRON, TIBC, FERRITIN ? ?Attestation Statements:  ? ?Reviewed by clinician on day of visit: allergies, medications, problem list, medical history, surgical history, family history, social history, and previous encounter notes. ? ? ?I, Trixie Dredge, am acting as transcriptionist for Dennard Nip, MD. ? ?I have reviewed the above documentation for accuracy and completeness, and I agree with the above. -  Dennard Nip, MD ? ? ?

## 2022-04-15 ENCOUNTER — Encounter (INDEPENDENT_AMBULATORY_CARE_PROVIDER_SITE_OTHER): Payer: Self-pay | Admitting: Family Medicine

## 2022-04-15 ENCOUNTER — Ambulatory Visit (INDEPENDENT_AMBULATORY_CARE_PROVIDER_SITE_OTHER): Payer: No Typology Code available for payment source | Admitting: Family Medicine

## 2022-04-15 VITALS — BP 95/62 | HR 80 | Temp 98.1°F | Ht 62.0 in | Wt 149.0 lb

## 2022-04-15 DIAGNOSIS — E669 Obesity, unspecified: Secondary | ICD-10-CM

## 2022-04-15 DIAGNOSIS — Z7985 Long-term (current) use of injectable non-insulin antidiabetic drugs: Secondary | ICD-10-CM

## 2022-04-15 DIAGNOSIS — K5900 Constipation, unspecified: Secondary | ICD-10-CM

## 2022-04-15 DIAGNOSIS — Z6827 Body mass index (BMI) 27.0-27.9, adult: Secondary | ICD-10-CM

## 2022-04-15 DIAGNOSIS — Z9189 Other specified personal risk factors, not elsewhere classified: Secondary | ICD-10-CM | POA: Insufficient documentation

## 2022-04-15 DIAGNOSIS — R7303 Prediabetes: Secondary | ICD-10-CM | POA: Diagnosis not present

## 2022-04-15 MED ORDER — SEMAGLUTIDE (1 MG/DOSE) 4 MG/3ML ~~LOC~~ SOPN: 1.0000 mg | PEN_INJECTOR | SUBCUTANEOUS | 0 refills | Status: DC

## 2022-04-29 NOTE — Progress Notes (Signed)
Chief Complaint:   OBESITY Heather Moreno is here to discuss her progress with her obesity treatment plan along with follow-up of her obesity related diagnoses. Heather Moreno is on keeping a food journal and adhering to recommended goals of 1500 calories and 90+ grams of protein daily and states she is following her eating plan approximately 65% of the time. Heather Moreno states she is doing 0 minutes 0 times per week.  Today's visit was #: 24 Starting weight: 161 lbs Starting date: 06/30/2018 Today's weight: 149 lbs Today's date: 04/15/2022 Total lbs lost to date: 12 Total lbs lost since last in-office visit: 5  Interim History: Heather Moreno continues to work on increasing her protein and portion control. Her hunger is controlled, and she feels well overall and not feeling very deprived.   Subjective:   1. Pre-diabetes Heather Moreno notes having morning polyphagia, but she is doing well with following her plan.  2. Constipation, unspecified constipation type Heather Moreno notes decrease in BM frequency.   3. At risk for diabetes mellitus Heather Moreno is at higher than average risk for developing diabetes due to her obesity.  Assessment/Plan:   1. Pre-diabetes We will refill Ozempic for 1 month. Hayley will continue to work on weight loss, exercise, and decreasing simple carbohydrates to help decrease the risk of diabetes.   - Semaglutide, 1 MG/DOSE, 4 MG/3ML SOPN; Inject 1 mg as directed once a week.  Dispense: 3 mL; Refill: 0  2. Constipation, unspecified constipation type Heather Moreno agreed to start miralax 17 grams daily OTC, and she is to increase her water intake.   3. At risk for diabetes mellitus Heather Moreno was given approximately 15 minutes of diabetic education and counseling today. We discussed intensive lifestyle modifications today with an emphasis on weight loss as well as increasing exercise and decreasing simple carbohydrates in her diet. We also reviewed medication options with an emphasis on risk versus benefits of those  discussed.  Repetitive spaced learning was employed today to elicit superior memory formation and behavioral change.  4. Obesity, Current BMI 27.3 Heather Moreno is currently in the action stage of change. As such, her goal is to continue with weight loss efforts. She has agreed to keeping a food journal and adhering to recommended goals of 1500 calories and 90+ grams of protein daily.   Behavioral modification strategies: increasing lean protein intake and meal planning and cooking strategies.  Heather Moreno has agreed to follow-up with our clinic in 6 weeks. She was informed of the importance of frequent follow-up visits to maximize her success with intensive lifestyle modifications for her multiple health conditions.   Objective:   Blood pressure 95/62, pulse 80, temperature 98.1 F (36.7 C), height 5\' 2"  (1.575 m), weight 149 lb (67.6 kg), SpO2 99 %. Body mass index is 27.25 kg/m.  General: Cooperative, alert, well developed, in no acute distress. HEENT: Conjunctivae and lids unremarkable. Cardiovascular: Regular rhythm.  Lungs: Normal work of breathing. Neurologic: No focal deficits.   Lab Results  Component Value Date   CREATININE 0.95 03/23/2022   BUN 15 03/23/2022   NA 137 03/23/2022   K 5.1 03/23/2022   CL 100 03/23/2022   CO2 25 03/23/2022   Lab Results  Component Value Date   ALT 35 (H) 03/23/2022   AST 23 03/23/2022   ALKPHOS 105 03/23/2022   BILITOT 0.3 03/23/2022   Lab Results  Component Value Date   HGBA1C 6.1 (H) 03/23/2022   HGBA1C 6.2 (H) 07/17/2021   HGBA1C 6.3 (H) 01/27/2021   HGBA1C  6.0 (H) 08/21/2020   HGBA1C 6.2 (H) 05/22/2020   Lab Results  Component Value Date   INSULIN 10.0 03/23/2022   INSULIN 27.6 (H) 07/17/2021   INSULIN 14.3 01/27/2021   INSULIN 23.0 08/21/2020   INSULIN 27.0 (H) 05/22/2020   Lab Results  Component Value Date   TSH 1.770 03/23/2022   Lab Results  Component Value Date   CHOL 175 03/23/2022   HDL 70 03/23/2022   LDLCALC 88  03/23/2022   TRIG 93 03/23/2022   Lab Results  Component Value Date   VD25OH 52.2 03/23/2022   VD25OH 49.8 07/17/2021   VD25OH 42.1 01/27/2021   Lab Results  Component Value Date   WBC 6.9 03/23/2022   HGB 13.5 03/23/2022   HCT 39.2 03/23/2022   MCV 88 03/23/2022   PLT 342 03/23/2022   No results found for: IRON, TIBC, FERRITIN  Attestation Statements:   Reviewed by clinician on day of visit: allergies, medications, problem list, medical history, surgical history, family history, social history, and previous encounter notes.   I, Burt Knack, am acting as transcriptionist for Quillian Quince, MD.  I have reviewed the above documentation for accuracy and completeness, and I agree with the above. -  Quillian Quince, MD

## 2022-05-01 ENCOUNTER — Other Ambulatory Visit (INDEPENDENT_AMBULATORY_CARE_PROVIDER_SITE_OTHER): Payer: Self-pay | Admitting: Family Medicine

## 2022-05-01 DIAGNOSIS — G43809 Other migraine, not intractable, without status migrainosus: Secondary | ICD-10-CM

## 2022-05-04 ENCOUNTER — Telehealth (INDEPENDENT_AMBULATORY_CARE_PROVIDER_SITE_OTHER): Payer: Self-pay | Admitting: Family Medicine

## 2022-05-04 ENCOUNTER — Encounter (INDEPENDENT_AMBULATORY_CARE_PROVIDER_SITE_OTHER): Payer: Self-pay

## 2022-05-04 NOTE — Telephone Encounter (Signed)
Dr. Dalbert Garnet - Prior authorization denied for [facility] Per insurance: A) You have not tried an 8-week trial of any of the following: a) antiepileptic drugs (AEDs) (e.g., divalproex sodium, topiramate, valproate sodium), b) beta-adrenergic blocking agents (e.g.,metoprolol, propranolol, timolol, atenolol, nadolol), or c) antidepressants  (e.g.,amitriptyline, venlafaxine) and B) Called patient. Advised patient of provider's approval for requested procedure, as well as any comments/instructions from provider.  Patient sent denial message via mychart.

## 2022-05-05 NOTE — Telephone Encounter (Signed)
Please call Dr Teofilo Pod office to ask how to do the prior auth (she is neurology)

## 2022-05-27 ENCOUNTER — Other Ambulatory Visit (INDEPENDENT_AMBULATORY_CARE_PROVIDER_SITE_OTHER): Payer: Self-pay | Admitting: Family Medicine

## 2022-05-27 DIAGNOSIS — R7303 Prediabetes: Secondary | ICD-10-CM

## 2022-05-27 MED ORDER — SEMAGLUTIDE (1 MG/DOSE) 4 MG/3ML ~~LOC~~ SOPN: 1.0000 mg | PEN_INJECTOR | SUBCUTANEOUS | 0 refills | Status: DC

## 2022-05-27 NOTE — Telephone Encounter (Signed)
LAST APPOINTMENT DATE: 04/15/22 NEXT APPOINTMENT DATE: 06/04/22   [id number] [place], [place] - [address] [place] [address] Phone: [phone] Fax: [phone]  Patient is requesting a refill of the following medications: Pending Prescriptions:                       Disp   Refills   Semaglutide, 1 MG/DOSE, 4 MG/3ML SOPN      3 mL   0       Sig: Inject 1 mg as directed once a week.   Date last filled: 04/15/22 Previously prescribed by Dr Dalbert Garnet  Lab Results      Component                Value               Date                      HGBA1C                   6.1 (H)             03/23/2022                HGBA1C                   6.2 (H)             07/17/2021                HGBA1C                   6.3 (H)             01/27/2021           Lab Results      Component                Value               Date                      LDLCALC                  88                  03/23/2022                CREATININE               0.95                03/23/2022           Lab Results      Component                Value               Date                      VD25OH                   52.2                03/23/2022                VD25OH                   49.8  07/17/2021                VD25OH                   42.1                01/27/2021            BP Readings from Last 3 Encounters: 04/15/22 : 95/62 03/23/22 : 110/71 02/05/22 : 121/75

## 2022-05-28 ENCOUNTER — Ambulatory Visit (INDEPENDENT_AMBULATORY_CARE_PROVIDER_SITE_OTHER): Payer: No Typology Code available for payment source | Admitting: Family Medicine

## 2022-05-30 ENCOUNTER — Encounter (INDEPENDENT_AMBULATORY_CARE_PROVIDER_SITE_OTHER): Payer: Self-pay | Admitting: Family Medicine

## 2022-05-30 DIAGNOSIS — R7303 Prediabetes: Secondary | ICD-10-CM

## 2022-06-01 NOTE — Telephone Encounter (Signed)
Please review

## 2022-06-03 MED ORDER — SEMAGLUTIDE (1 MG/DOSE) 4 MG/3ML ~~LOC~~ SOPN: 1.0000 mg | PEN_INJECTOR | SUBCUTANEOUS | 0 refills | Status: DC

## 2022-06-03 NOTE — Telephone Encounter (Signed)
Please send in 90 days x1

## 2022-06-04 ENCOUNTER — Ambulatory Visit (INDEPENDENT_AMBULATORY_CARE_PROVIDER_SITE_OTHER): Payer: No Typology Code available for payment source | Admitting: Family Medicine

## 2022-06-04 ENCOUNTER — Encounter (INDEPENDENT_AMBULATORY_CARE_PROVIDER_SITE_OTHER): Payer: Self-pay | Admitting: Family Medicine

## 2022-06-04 VITALS — BP 108/70 | HR 76 | Temp 98.1°F | Ht 62.0 in | Wt 150.0 lb

## 2022-06-04 DIAGNOSIS — G43809 Other migraine, not intractable, without status migrainosus: Secondary | ICD-10-CM

## 2022-06-04 DIAGNOSIS — Z6827 Body mass index (BMI) 27.0-27.9, adult: Secondary | ICD-10-CM | POA: Diagnosis not present

## 2022-06-04 DIAGNOSIS — E669 Obesity, unspecified: Secondary | ICD-10-CM

## 2022-06-04 MED ORDER — UBRELVY 100 MG PO TABS
100.0000 mg | ORAL_TABLET | Freq: Every day | ORAL | 0 refills | Status: DC
Start: 2022-06-04 — End: 2022-07-28

## 2022-06-08 NOTE — Progress Notes (Unsigned)
Chief Complaint:   OBESITY Heather Moreno is here to discuss her progress with her obesity treatment plan along with follow-up of her obesity related diagnoses. Heather Moreno is on keeping a food journal and adhering to recommended goals of 1500 calories and 90+ grams of protein daily and states she is following her eating plan approximately 60% of the time. Heather Moreno states she is walking and doing strengthening for 30-40 minutes 2 times per week.  Today's visit was #: 25 Starting weight: 161 lbs Starting date: 06/30/2018 Today's weight: 150 lbs Today's date: 06/04/2022 Total lbs lost to date: 11 Total lbs lost since last in-office visit: 0  Interim History: Heather Moreno has done well with maintaining her weight.  She has struggled with exercise due to ongoing migraine headaches.  She feels her protein has decreased a bit as well.  Subjective:   1. Other migraine without status migrainosus, not intractable Colleen tried propranolol, Lexapro, and Nurtec but failed.  Her prior authorization for Bennie Pierini was denied for an unknown reason.  Assessment/Plan:   1. Other migraine without status migrainosus, not intractable Heather Moreno will continue Ubrelvy 100 mg daily, and we will refill for 90 days; discuss prior authorization for Qulipta with new PA specialist.  - Ubrogepant (UBRELVY) 100 MG TABS; Take 100 mg by mouth daily.  Dispense: 90 tablet; Refill: 0  2. Obesity, Current BMI 27.5 Heather Moreno is currently in the action stage of change. As such, her goal is to continue with weight loss efforts. She has agreed to keeping a food journal and adhering to recommended goals of 1500 calories and 90+ grams of protein daily.   Heather Moreno will continue Wegovy at 1 mg once weekly, and we will continue to follow.   Exercise goals: As is, at core strengthening exercises.  Wobble cushion was demonstrated today.  Behavioral modification strategies: increasing lean protein intake.  Heather Moreno has agreed to follow-up with our clinic in 6 weeks.  She was informed of the importance of frequent follow-up visits to maximize her success with intensive lifestyle modifications for her multiple health conditions.   Objective:   There were no vitals taken for this visit. There is no height or weight on file to calculate BMI.  General: Cooperative, alert, well developed, in no acute distress. HEENT: Conjunctivae and lids unremarkable. Cardiovascular: Regular rhythm.  Lungs: Normal work of breathing. Neurologic: No focal deficits.   Lab Results  Component Value Date   CREATININE 0.95 03/23/2022   BUN 15 03/23/2022   NA 137 03/23/2022   K 5.1 03/23/2022   CL 100 03/23/2022   CO2 25 03/23/2022   Lab Results  Component Value Date   ALT 35 (H) 03/23/2022   AST 23 03/23/2022   ALKPHOS 105 03/23/2022   BILITOT 0.3 03/23/2022   Lab Results  Component Value Date   HGBA1C 6.1 (H) 03/23/2022   HGBA1C 6.2 (H) 07/17/2021   HGBA1C 6.3 (H) 01/27/2021   HGBA1C 6.0 (H) 08/21/2020   HGBA1C 6.2 (H) 05/22/2020   Lab Results  Component Value Date   INSULIN 10.0 03/23/2022   INSULIN 27.6 (H) 07/17/2021   INSULIN 14.3 01/27/2021   INSULIN 23.0 08/21/2020   INSULIN 27.0 (H) 05/22/2020   Lab Results  Component Value Date   TSH 1.770 03/23/2022   Lab Results  Component Value Date   CHOL 175 03/23/2022   HDL 70 03/23/2022   LDLCALC 88 03/23/2022   TRIG 93 03/23/2022   Lab Results  Component Value Date   VD25OH 52.2  03/23/2022   VD25OH 49.8 07/17/2021   VD25OH 42.1 01/27/2021   Lab Results  Component Value Date   WBC 6.9 03/23/2022   HGB 13.5 03/23/2022   HCT 39.2 03/23/2022   MCV 88 03/23/2022   PLT 342 03/23/2022   No results found for: "IRON", "TIBC", "FERRITIN"  Attestation Statements:   Reviewed by clinician on day of visit: allergies, medications, problem list, medical history, surgical history, family history, social history, and previous encounter notes.  Time spent on visit including pre-visit chart review and  post-visit care and charting was 30 minutes.   I, Burt Knack, am acting as transcriptionist for Quillian Quince, MD.  I have reviewed the above documentation for accuracy and completeness, and I agree with the above. -  Quillian Quince, MD

## 2022-06-17 NOTE — Telephone Encounter (Signed)
Patient will let us know how her neurologist office does the PA for this medication.

## 2022-06-24 ENCOUNTER — Other Ambulatory Visit (INDEPENDENT_AMBULATORY_CARE_PROVIDER_SITE_OTHER): Payer: Self-pay | Admitting: Family Medicine

## 2022-06-24 DIAGNOSIS — E559 Vitamin D deficiency, unspecified: Secondary | ICD-10-CM

## 2022-07-08 ENCOUNTER — Encounter (INDEPENDENT_AMBULATORY_CARE_PROVIDER_SITE_OTHER): Payer: Self-pay

## 2022-07-23 ENCOUNTER — Ambulatory Visit (INDEPENDENT_AMBULATORY_CARE_PROVIDER_SITE_OTHER): Payer: No Typology Code available for payment source | Admitting: Family Medicine

## 2022-07-28 ENCOUNTER — Encounter (INDEPENDENT_AMBULATORY_CARE_PROVIDER_SITE_OTHER): Payer: Self-pay | Admitting: Family Medicine

## 2022-07-28 ENCOUNTER — Encounter (INDEPENDENT_AMBULATORY_CARE_PROVIDER_SITE_OTHER): Payer: Self-pay

## 2022-07-28 ENCOUNTER — Telehealth (INDEPENDENT_AMBULATORY_CARE_PROVIDER_SITE_OTHER): Payer: Self-pay | Admitting: Family Medicine

## 2022-07-28 ENCOUNTER — Ambulatory Visit (INDEPENDENT_AMBULATORY_CARE_PROVIDER_SITE_OTHER): Payer: No Typology Code available for payment source | Admitting: Family Medicine

## 2022-07-28 VITALS — BP 100/63 | HR 88 | Temp 98.1°F | Ht 62.0 in | Wt 147.0 lb

## 2022-07-28 DIAGNOSIS — F3289 Other specified depressive episodes: Secondary | ICD-10-CM

## 2022-07-28 DIAGNOSIS — E782 Mixed hyperlipidemia: Secondary | ICD-10-CM | POA: Diagnosis not present

## 2022-07-28 DIAGNOSIS — G43809 Other migraine, not intractable, without status migrainosus: Secondary | ICD-10-CM | POA: Diagnosis not present

## 2022-07-28 DIAGNOSIS — Z6827 Body mass index (BMI) 27.0-27.9, adult: Secondary | ICD-10-CM

## 2022-07-28 DIAGNOSIS — E559 Vitamin D deficiency, unspecified: Secondary | ICD-10-CM

## 2022-07-28 DIAGNOSIS — R7303 Prediabetes: Secondary | ICD-10-CM

## 2022-07-28 DIAGNOSIS — Z6830 Body mass index (BMI) 30.0-30.9, adult: Secondary | ICD-10-CM

## 2022-07-28 DIAGNOSIS — E669 Obesity, unspecified: Secondary | ICD-10-CM

## 2022-07-28 DIAGNOSIS — F32A Depression, unspecified: Secondary | ICD-10-CM | POA: Insufficient documentation

## 2022-07-28 MED ORDER — UBRELVY 100 MG PO TABS: 100.0000 mg | ORAL_TABLET | Freq: Every day | ORAL | 0 refills | Status: DC

## 2022-07-28 MED ORDER — SEMAGLUTIDE (2 MG/DOSE) 8 MG/3ML ~~LOC~~ SOPN: 2.0000 mg | PEN_INJECTOR | SUBCUTANEOUS | 0 refills | Status: DC

## 2022-07-28 MED ORDER — ATORVASTATIN CALCIUM 10 MG PO TABS: ORAL_TABLET | ORAL | 0 refills | Status: DC

## 2022-07-28 NOTE — Telephone Encounter (Signed)
Dr. Dalbert Garnet - Prior authorization for Ozempic is not required. Per insurance: This medication or product is on your plan's list of covered drugs. Prior authorization is not required at this time. Patient sent message via mychart.

## 2022-07-29 ENCOUNTER — Telehealth (INDEPENDENT_AMBULATORY_CARE_PROVIDER_SITE_OTHER): Payer: Self-pay | Admitting: Family Medicine

## 2022-07-29 ENCOUNTER — Encounter (INDEPENDENT_AMBULATORY_CARE_PROVIDER_SITE_OTHER): Payer: Self-pay

## 2022-07-29 LAB — LIPID PANEL WITH LDL/HDL RATIO
Cholesterol, Total: 192 mg/dL (ref 100–199)
HDL: 83 mg/dL (ref >39)
LDL Chol Calc (NIH): 91 mg/dL (ref 0–99)
LDL/HDL Ratio: 1.1 ratio (ref 0.0–3.2)
Triglycerides: 104 mg/dL (ref 0–149)
VLDL Cholesterol Cal: 18 mg/dL (ref 5–40)

## 2022-07-29 LAB — CMP14+EGFR
ALT: 45 IU/L — ABNORMAL HIGH (ref 0–32)
AST: 27 IU/L (ref 0–40)
Albumin/Globulin Ratio: 1.8 (ref 1.2–2.2)
Albumin: 4.8 g/dL (ref 3.9–4.9)
Alkaline Phosphatase: 129 IU/L — ABNORMAL HIGH (ref 44–121)
BUN/Creatinine Ratio: 13 (ref 9–23)
BUN: 12 mg/dL (ref 6–24)
Bilirubin Total: 0.4 mg/dL (ref 0.0–1.2)
CO2: 23 mmol/L (ref 20–29)
Calcium: 10 mg/dL (ref 8.7–10.2)
Chloride: 99 mmol/L (ref 96–106)
Creatinine, Ser: 0.93 mg/dL (ref 0.57–1.00)
Globulin, Total: 2.6 g/dL (ref 1.5–4.5)
Glucose: 100 mg/dL — ABNORMAL HIGH (ref 70–99)
Potassium: 5 mmol/L (ref 3.5–5.2)
Sodium: 140 mmol/L (ref 134–144)
Total Protein: 7.4 g/dL (ref 6.0–8.5)
eGFR: 75 mL/min/{1.73_m2} (ref >59)

## 2022-07-29 LAB — HEMOGLOBIN A1C
Est. average glucose Bld gHb Est-mCnc: 131 mg/dL
Hgb A1c MFr Bld: 6.2 % — ABNORMAL HIGH (ref 4.8–5.6)

## 2022-07-29 LAB — TSH: TSH: 1.83 u[IU]/mL (ref 0.450–4.500)

## 2022-07-29 LAB — INSULIN, RANDOM: INSULIN: 28.6 u[IU]/mL — ABNORMAL HIGH (ref 2.6–24.9)

## 2022-07-29 LAB — VITAMIN D 25 HYDROXY (VIT D DEFICIENCY, FRACTURES): Vit D, 25-Hydroxy: 60.9 ng/mL (ref 30.0–100.0)

## 2022-07-29 LAB — VITAMIN B12: Vitamin B-12: 988 pg/mL (ref 232–1245)

## 2022-07-29 NOTE — Telephone Encounter (Signed)
Dr. Dalbert Garnet - Prior authorization partially approved for Ubrelvy. Per insurance: We have partially approved your request for this drug up to the amount your plan covers 16 tablets per month of Ubrelvy 100mg . Your request for more drug has been denied. Patient sent a message via mychart.

## 2022-08-04 NOTE — Progress Notes (Signed)
Chief Complaint:   OBESITY Heather Moreno is here to discuss her progress with her obesity treatment plan along with follow-up of her obesity related diagnoses. Heather Moreno is on keeping a food journal and adhering to recommended goals of 1500 calories and 90+ grams of protein daily and states she is following her eating plan approximately 60% of the time. Heather Moreno states she is walking and doing strengthening for 30-40 minutes 1-2 times per week.  Today's visit was #: 26 Starting weight: 161 lbs Starting date: 06/30/2018 Today's weight: 147 lbs Today's date: 07/28/2022 Total lbs lost to date: 14 Total lbs lost since last in-office visit: 3  Interim History: Heather Moreno continues to do well with weight loss. Her hunger is mostly controlled and she is working on meal planning and increasing protein.   Subjective:   1. Prediabetes Heather Moreno continues to work on her diet and weight loss. She is doing well with Ozempic, but she wonders if she can increase her dose. She is due to have labs.   2. Mixed hyperlipidemia Heather Moreno is on Lipitor, with no side effects noted. She is due for labs.   3. Vitamin D deficiency Heather Moreno is on Vitamin D, and she is due for labs.   4. Other migraine without status migrainosus, not intractable Heather Moreno had significant constipation with Qulipta and she stopped. She would like to continue with Heather Moreno and she requests a refill. She will be seeing a new PCP soon.   5. Other depression, emotional eating behaviors Joelene notes increased emotional eating behaviors, worse in the last few weeks with 2 kids off to college now.   Assessment/Plan:   1. Prediabetes We will check labs today. Heather Moreno agreed to increase Ozempic to 2 mg once weekly, and we will refill for 90 days (patient is to increase to 1.5 mg for now).  - CMP14+EGFR - Hemoglobin A1c - Insulin, random - TSH - Vitamin B12 - Semaglutide, 2 MG/DOSE, 8 MG/3ML SOPN; Inject 2 mg as directed once a week.  Dispense: 9 mL; Refill:  0  2. Mixed hyperlipidemia We will check labs today, and we will refill Lipitor for 90 days.  - Lipid Panel With LDL/HDL Ratio - atorvastatin (LIPITOR) 10 MG tablet; TAKE 1 TABLET BY MOUTH EVERYDAY AT BEDTIME  Dispense: 90 tablet; Refill: 0  3. Vitamin D deficiency We will check labs today. Heather Moreno will follow-up for routine testing of Vitamin D, at least 2-3 times per year to avoid over-replacement.  - VITAMIN D 25 Hydroxy (Vit-D Deficiency, Fractures)  4. Other migraine without status migrainosus, not intractable We will refill Ubrelvy 100 mg once daily for 90 days.  - Ubrogepant (UBRELVY) 100 MG TABS; Take 100 mg by mouth daily.  Dispense: 90 tablet; Refill: 0  5. Other depression, emotional eating behaviors Emotional eating behavior strategies were discussed. We will hold off on starting medications for now.   6. Obesity, Current BMI 27.0 Heather Moreno is currently in the action stage of change. As such, her goal is to continue with weight loss efforts. She has agreed to keeping a food journal and adhering to recommended goals of 1500 calories and 90+ grams of protein daily.   Exercise goals: As is.   Behavioral modification strategies: increasing lean protein intake.  Heather Moreno has agreed to follow-up with our clinic in 8 weeks. She was informed of the importance of frequent follow-up visits to maximize her success with intensive lifestyle modifications for her multiple health conditions.   Heather Moreno was informed we would discuss  her lab results at her next visit unless there is a critical issue that needs to be addressed sooner. Heather Moreno agreed to keep her next visit at the agreed upon time to discuss these results. Objective:   Blood pressure 100/63, pulse 88, temperature 98.1 F (36.7 C), height '5\' 2"'  (1.575 m), weight 147 lb (66.7 kg), SpO2 98 %. Body mass index is 26.89 kg/m.  General: Cooperative, alert, well developed, in no acute distress. HEENT: Conjunctivae and lids  unremarkable. Cardiovascular: Regular rhythm.  Lungs: Normal work of breathing. Neurologic: No focal deficits.   Lab Results  Component Value Date   CREATININE 0.93 07/28/2022   BUN 12 07/28/2022   NA 140 07/28/2022   K 5.0 07/28/2022   CL 99 07/28/2022   CO2 23 07/28/2022   Lab Results  Component Value Date   ALT 45 (H) 07/28/2022   AST 27 07/28/2022   ALKPHOS 129 (H) 07/28/2022   BILITOT 0.4 07/28/2022   Lab Results  Component Value Date   HGBA1C 6.2 (H) 07/28/2022   HGBA1C 6.1 (H) 03/23/2022   HGBA1C 6.2 (H) 07/17/2021   HGBA1C 6.3 (H) 01/27/2021   HGBA1C 6.0 (H) 08/21/2020   Lab Results  Component Value Date   INSULIN 28.6 (H) 07/28/2022   INSULIN 10.0 03/23/2022   INSULIN 27.6 (H) 07/17/2021   INSULIN 14.3 01/27/2021   INSULIN 23.0 08/21/2020   Lab Results  Component Value Date   TSH 1.830 07/28/2022   Lab Results  Component Value Date   CHOL 192 07/28/2022   HDL 83 07/28/2022   LDLCALC 91 07/28/2022   TRIG 104 07/28/2022   Lab Results  Component Value Date   VD25OH 60.9 07/28/2022   VD25OH 52.2 03/23/2022   VD25OH 49.8 07/17/2021   Lab Results  Component Value Date   WBC 6.9 03/23/2022   HGB 13.5 03/23/2022   HCT 39.2 03/23/2022   MCV 88 03/23/2022   PLT 342 03/23/2022   No results found for: "IRON", "TIBC", "FERRITIN"  Attestation Statements:   Reviewed by clinician on day of visit: allergies, medications, problem list, medical history, surgical history, family history, social history, and previous encounter notes.   I, Trixie Dredge, am acting as transcriptionist for Dennard Nip, MD.  I have reviewed the above documentation for accuracy and completeness, and I agree with the above. -  Dennard Nip, MD

## 2022-10-01 ENCOUNTER — Telehealth (INDEPENDENT_AMBULATORY_CARE_PROVIDER_SITE_OTHER): Payer: No Typology Code available for payment source | Admitting: Family Medicine

## 2022-10-01 DIAGNOSIS — E559 Vitamin D deficiency, unspecified: Secondary | ICD-10-CM | POA: Diagnosis not present

## 2022-10-01 DIAGNOSIS — R7303 Prediabetes: Secondary | ICD-10-CM

## 2022-10-01 DIAGNOSIS — J069 Acute upper respiratory infection, unspecified: Secondary | ICD-10-CM | POA: Diagnosis not present

## 2022-10-01 DIAGNOSIS — E669 Obesity, unspecified: Secondary | ICD-10-CM | POA: Diagnosis not present

## 2022-10-01 DIAGNOSIS — Z6827 Body mass index (BMI) 27.0-27.9, adult: Secondary | ICD-10-CM

## 2022-10-01 MED ORDER — VITAMIN D (ERGOCALCIFEROL) 1.25 MG (50000 UNIT) PO CAPS: 50000.0000 [IU] | ORAL_CAPSULE | ORAL | 0 refills | Status: DC

## 2022-10-01 MED ORDER — SEMAGLUTIDE (2 MG/DOSE) 8 MG/3ML ~~LOC~~ SOPN: 2.0000 mg | PEN_INJECTOR | SUBCUTANEOUS | 0 refills | Status: DC

## 2022-10-05 ENCOUNTER — Encounter (INDEPENDENT_AMBULATORY_CARE_PROVIDER_SITE_OTHER): Payer: Self-pay | Admitting: Family Medicine

## 2022-10-12 NOTE — Progress Notes (Unsigned)
TeleHealth Visit:  Due to the COVID-19 pandemic, this visit was completed with telemedicine (audio/video) technology to reduce patient and provider exposure as well as to preserve personal protective equipment.   Heather Moreno has verbally consented to this TeleHealth visit. The patient is located at home, the provider is located at the [facility] office. The participants in this visit include the listed provider and patient. The visit was conducted today via MyChart video.   Chief Complaint: OBESITY Heather Moreno is here to discuss her progress with her obesity treatment plan along with follow-up of her obesity related diagnoses. Heather Moreno is on keeping a food journal and adhering to recommended goals of 1500 calories and 90+ grams of protein daily and states she is following her eating plan approximately (unknown)% of the time. Heather Moreno states she is doing 0 minutes 0 times per week.  Today's visit was #: 27 Starting weight: 161 lbs Starting date: 06/30/2018  Interim History: Heather Moreno has struggled to concentrate on weight loss due to some traveling and being sick with sick family members.  She tries to bring her lunch to work and she is trying to increase her protein.  Subjective:   1. Prediabetes Heather Moreno is on Ozempic and her dose is being self adjusted, but she feels that she is ready to increase her dose to 2 mg.  2. URI, acute Heather Moreno has been sick for the last 3 to 4 days.  She is starting to feel better.  She has had multiple sick contacts and with sick family members.  She takes OTC Robitussin for her cough with some relief.  3. Vitamin D deficiency Heather Moreno has been out of her vitamin D prescription for 3 weeks or so, and her last vitamin D level was at goal.  She was going to have labs today, but she had to change her visit to virtual due to upper respiratory infection.  Assessment/Plan:   1. Prediabetes Heather Moreno will continue Ozempic at 2 mg once weekly, and she is okay to increase to the  full dose.  - Semaglutide, 2 MG/DOSE, 8 MG/3ML SOPN; Inject 2 mg as directed once a week.  Dispense: 9 mL; Refill: 0  2. URI, acute Heather Moreno will continue to use OTC medications for symptomatic care  3. Vitamin D deficiency We will refill prescription Vitamin D for 90 days, and we will recheck labs in 1 month. Heather Moreno will follow-up for routine testing of Vitamin D, at least 2-3 times per year to avoid over-replacement.  - Vitamin D, Ergocalciferol, (DRISDOL) 1.25 MG (50000 UNIT) CAPS capsule; Take 1 capsule (50,000 Units total) by mouth every 7 (seven) days.  Dispense: 12 capsule; Refill: 0  4. Obesity, Current BMI 27.0 Heather Moreno is currently in the action stage of change. As such, her goal is to continue with weight loss efforts. She has agreed to keeping a food journal and adhering to recommended goals of 1500 calories and 90+ grams of protein daily.   We will recheck fasting labs at her next visit.   Behavioral modification strategies: increasing lean protein intake and meal planning and cooking strategies.  Heather Moreno has agreed to follow-up with our clinic in 4 weeks. She was informed of the importance of frequent follow-up visits to maximize her success with intensive lifestyle modifications for her multiple health conditions.  Objective:   VITALS: Per patient if applicable, see vitals. GENERAL: Alert and in no acute distress. CARDIOPULMONARY: No increased WOB. Speaking in clear sentences.  PSYCH: Pleasant and cooperative. Speech normal  rate and rhythm. Affect is appropriate. Insight and judgement are appropriate. Attention is focused, linear, and appropriate.  NEURO: Oriented as arrived to appointment on time with no prompting.   Lab Results  Component Value Date   CREATININE 0.93 07/28/2022   BUN 12 07/28/2022   NA 140 07/28/2022   K 5.0 07/28/2022   CL 99 07/28/2022   CO2 23 07/28/2022   Lab Results  Component Value Date   ALT 45 (H) 07/28/2022   AST 27 07/28/2022   ALKPHOS 129  (H) 07/28/2022   BILITOT 0.4 07/28/2022   Lab Results  Component Value Date   HGBA1C 6.2 (H) 07/28/2022   HGBA1C 6.1 (H) 03/23/2022   HGBA1C 6.2 (H) 07/17/2021   HGBA1C 6.3 (H) 01/27/2021   HGBA1C 6.0 (H) 08/21/2020   Lab Results  Component Value Date   INSULIN 28.6 (H) 07/28/2022   INSULIN 10.0 03/23/2022   INSULIN 27.6 (H) 07/17/2021   INSULIN 14.3 01/27/2021   INSULIN 23.0 08/21/2020   Lab Results  Component Value Date   TSH 1.830 07/28/2022   Lab Results  Component Value Date   CHOL 192 07/28/2022   HDL 83 07/28/2022   LDLCALC 91 07/28/2022   TRIG 104 07/28/2022   Lab Results  Component Value Date   VD25OH 60.9 07/28/2022   VD25OH 52.2 03/23/2022   VD25OH 49.8 07/17/2021   Lab Results  Component Value Date   WBC 6.9 03/23/2022   HGB 13.5 03/23/2022   HCT 39.2 03/23/2022   MCV 88 03/23/2022   PLT 342 03/23/2022   No results found for: "IRON", "TIBC", "FERRITIN"  Attestation Statements:   Reviewed by clinician on day of visit: allergies, medications, problem list, medical history, surgical history, family history, social history, and previous encounter notes.   I, Burt Knack, am acting as transcriptionist for Quillian Quince, MD.  I have reviewed the above documentation for accuracy and completeness, and I agree with the above. - Quillian Quince, MD

## 2022-11-09 ENCOUNTER — Encounter (INDEPENDENT_AMBULATORY_CARE_PROVIDER_SITE_OTHER): Payer: Self-pay | Admitting: Family Medicine

## 2022-11-09 ENCOUNTER — Other Ambulatory Visit (INDEPENDENT_AMBULATORY_CARE_PROVIDER_SITE_OTHER): Payer: Self-pay | Admitting: Family Medicine

## 2022-11-09 DIAGNOSIS — R7303 Prediabetes: Secondary | ICD-10-CM

## 2022-11-11 MED ORDER — SEMAGLUTIDE (2 MG/DOSE) 8 MG/3ML ~~LOC~~ SOPN: 2.0000 mg | PEN_INJECTOR | SUBCUTANEOUS | 0 refills | Status: DC

## 2022-11-11 NOTE — Telephone Encounter (Signed)
Ok x 90

## 2022-12-16 ENCOUNTER — Encounter (INDEPENDENT_AMBULATORY_CARE_PROVIDER_SITE_OTHER): Payer: Self-pay | Admitting: Family Medicine

## 2022-12-16 ENCOUNTER — Ambulatory Visit (INDEPENDENT_AMBULATORY_CARE_PROVIDER_SITE_OTHER): Payer: No Typology Code available for payment source | Admitting: Family Medicine

## 2022-12-16 VITALS — BP 99/62 | HR 89 | Temp 97.9°F | Ht 62.0 in | Wt 144.0 lb

## 2022-12-16 DIAGNOSIS — R7303 Prediabetes: Secondary | ICD-10-CM | POA: Diagnosis not present

## 2022-12-16 DIAGNOSIS — E669 Obesity, unspecified: Secondary | ICD-10-CM

## 2022-12-16 DIAGNOSIS — D508 Other iron deficiency anemias: Secondary | ICD-10-CM

## 2022-12-16 DIAGNOSIS — E782 Mixed hyperlipidemia: Secondary | ICD-10-CM

## 2022-12-16 DIAGNOSIS — Z6826 Body mass index (BMI) 26.0-26.9, adult: Secondary | ICD-10-CM

## 2022-12-16 DIAGNOSIS — E559 Vitamin D deficiency, unspecified: Secondary | ICD-10-CM | POA: Diagnosis not present

## 2022-12-16 DIAGNOSIS — G43809 Other migraine, not intractable, without status migrainosus: Secondary | ICD-10-CM | POA: Diagnosis not present

## 2022-12-16 DIAGNOSIS — D649 Anemia, unspecified: Secondary | ICD-10-CM | POA: Insufficient documentation

## 2022-12-16 MED ORDER — SEMAGLUTIDE (2 MG/DOSE) 8 MG/3ML ~~LOC~~ SOPN: 2.0000 mg | PEN_INJECTOR | SUBCUTANEOUS | 0 refills | Status: DC

## 2022-12-16 MED ORDER — ATORVASTATIN CALCIUM 10 MG PO TABS
ORAL_TABLET | ORAL | 0 refills | Status: DC
Start: 2022-12-16 — End: 2022-12-16

## 2022-12-16 MED ORDER — SEMAGLUTIDE (2 MG/DOSE) 8 MG/3ML ~~LOC~~ SOPN: 2.0000 mg | PEN_INJECTOR | SUBCUTANEOUS | 1 refills | Status: DC

## 2022-12-16 MED ORDER — ATORVASTATIN CALCIUM 10 MG PO TABS: ORAL_TABLET | ORAL | 1 refills | Status: DC

## 2022-12-16 MED ORDER — UBRELVY 100 MG PO TABS: 100.0000 mg | ORAL_TABLET | Freq: Every day | ORAL | 0 refills | Status: DC

## 2022-12-16 MED ORDER — UBRELVY 100 MG PO TABS: 100.0000 mg | ORAL_TABLET | Freq: Every day | ORAL | 1 refills | Status: DC

## 2022-12-17 LAB — LIPID PANEL WITH LDL/HDL RATIO
Cholesterol, Total: 201 mg/dL — ABNORMAL HIGH (ref 100–199)
HDL: 93 mg/dL (ref >39)
LDL Chol Calc (NIH): 90 mg/dL (ref 0–99)
LDL/HDL Ratio: 1 ratio (ref 0.0–3.2)
Triglycerides: 104 mg/dL (ref 0–149)
VLDL Cholesterol Cal: 18 mg/dL (ref 5–40)

## 2022-12-17 LAB — HEMOGLOBIN A1C
Est. average glucose Bld gHb Est-mCnc: 126 mg/dL
Hgb A1c MFr Bld: 6 % — ABNORMAL HIGH (ref 4.8–5.6)

## 2022-12-17 LAB — CMP14+EGFR
ALT: 38 IU/L — ABNORMAL HIGH (ref 0–32)
AST: 27 IU/L (ref 0–40)
Albumin/Globulin Ratio: 1.5 (ref 1.2–2.2)
Albumin: 4.5 g/dL (ref 3.8–4.9)
Alkaline Phosphatase: 118 IU/L (ref 44–121)
BUN/Creatinine Ratio: 13 (ref 9–23)
BUN: 13 mg/dL (ref 6–24)
Bilirubin Total: 0.5 mg/dL (ref 0.0–1.2)
CO2: 23 mmol/L (ref 20–29)
Calcium: 10.2 mg/dL (ref 8.7–10.2)
Chloride: 99 mmol/L (ref 96–106)
Creatinine, Ser: 0.97 mg/dL (ref 0.57–1.00)
Globulin, Total: 3.1 g/dL (ref 1.5–4.5)
Glucose: 88 mg/dL (ref 70–99)
Potassium: 5.1 mmol/L (ref 3.5–5.2)
Sodium: 139 mmol/L (ref 134–144)
Total Protein: 7.6 g/dL (ref 6.0–8.5)
eGFR: 71 mL/min/{1.73_m2} (ref >59)

## 2022-12-17 LAB — CBC WITH DIFFERENTIAL/PLATELET
Basophils Absolute: 0.1 10*3/uL (ref 0.0–0.2)
Basos: 2 %
EOS (ABSOLUTE): 0.4 10*3/uL (ref 0.0–0.4)
Eos: 7 %
Hematocrit: 44.3 % (ref 34.0–46.6)
Hemoglobin: 14.3 g/dL (ref 11.1–15.9)
Immature Grans (Abs): 0 10*3/uL (ref 0.0–0.1)
Immature Granulocytes: 0 %
Lymphocytes Absolute: 1.9 10*3/uL (ref 0.7–3.1)
Lymphs: 30 %
MCH: 28.3 pg (ref 26.6–33.0)
MCHC: 32.3 g/dL (ref 31.5–35.7)
MCV: 88 fL (ref 79–97)
Monocytes Absolute: 0.5 10*3/uL (ref 0.1–0.9)
Monocytes: 8 %
Neutrophils Absolute: 3.4 10*3/uL (ref 1.4–7.0)
Neutrophils: 53 %
Platelets: 323 10*3/uL (ref 150–450)
RBC: 5.05 x10E6/uL (ref 3.77–5.28)
RDW: 12.8 % (ref 11.7–15.4)
WBC: 6.3 10*3/uL (ref 3.4–10.8)

## 2022-12-17 LAB — INSULIN, RANDOM: INSULIN: 19.3 u[IU]/mL (ref 2.6–24.9)

## 2022-12-17 LAB — IRON AND TIBC
Iron Saturation: 43 % (ref 15–55)
Iron: 143 ug/dL (ref 27–159)
Total Iron Binding Capacity: 336 ug/dL (ref 250–450)
UIBC: 193 ug/dL (ref 131–425)

## 2022-12-17 LAB — FOLATE: Folate: 18.9 ng/mL (ref >3.0)

## 2022-12-17 LAB — TSH: TSH: 1.54 u[IU]/mL (ref 0.450–4.500)

## 2022-12-17 LAB — FERRITIN: Ferritin: 103 ng/mL (ref 15–150)

## 2022-12-17 LAB — VITAMIN D 25 HYDROXY (VIT D DEFICIENCY, FRACTURES): Vit D, 25-Hydroxy: 35.9 ng/mL (ref 30.0–100.0)

## 2022-12-22 NOTE — Progress Notes (Unsigned)
Chief Complaint:   OBESITY Heather Moreno is here to discuss her progress with her obesity treatment plan along with follow-up of her obesity related diagnoses. Heather Moreno is on {MWMwtlossportion/plan2:23431} and states she is following her eating plan approximately ***% of the time. Heather Moreno states she is *** *** minutes *** times per week.  Today's visit was #: *** Starting weight: *** Starting date: *** Today's weight: *** Today's date: 12/16/2022 Total lbs lost to date: *** Total lbs lost since last in-office visit: ***  Interim History: ***  Subjective:   1. Mixed hyperlipidemia ***  2. Other migraine without status migrainosus, not intractable ***  3. Prediabetes ***  4. Vitamin D deficiency ***  5. Other iron deficiency anemia ***  Assessment/Plan:   1. Mixed hyperlipidemia *** - Lipid Panel With LDL/HDL Ratio - atorvastatin (LIPITOR) 10 MG tablet; TAKE 1 TABLET BY MOUTH EVERYDAY AT BEDTIME  Dispense: 90 tablet; Refill: 1  2. Other migraine without status migrainosus, not intractable *** - Ubrogepant (UBRELVY) 100 MG TABS; Take 100 mg by mouth daily.  Dispense: 90 tablet; Refill: 1  3. Prediabetes *** - CMP14+EGFR - Hemoglobin A1c - Insulin, random - Semaglutide, 2 MG/DOSE, 8 MG/3ML SOPN; Inject 2 mg as directed once a week.  Dispense: 9 mL; Refill: 1  4. Vitamin D deficiency *** - VITAMIN D 25 Hydroxy (Vit-D Deficiency, Fractures)  5. Other iron deficiency anemia *** - Folate - Iron and TIBC - Ferritin - CBC with Differential/Platelet - TSH  6. Obesity, Current BMI 26.5 Shakala is currently in the action stage of change. As such, her goal is to continue with weight loss efforts. She has agreed to keeping a food journal and adhering to recommended goals of 1500 calories and 90+ grams of protein daily.   Behavioral modification strategies: increasing lean protein intake.  Heather Moreno has agreed to follow-up with our clinic in 12 weeks. She was informed of the  importance of frequent follow-up visits to maximize her success with intensive lifestyle modifications for her multiple health conditions.   [name] was informed we would discuss her lab results at her next visit unless there is a critical issue that needs to be addressed sooner. Heather Moreno agreed to keep her next visit at the agreed upon time to discuss these results.  Objective:   Blood pressure 99/62, pulse 89, temperature 97.9 F (36.6 C), height 5\' 2"  (1.575 m), weight 144 lb (65.3 kg), SpO2 96 %. Body mass index is 26.34 kg/m.  General: Cooperative, alert, well developed, in no acute distress. HEENT: Conjunctivae and lids unremarkable. Cardiovascular: Regular rhythm.  Lungs: Normal work of breathing. Neurologic: No focal deficits.   Lab Results  Component Value Date   CREATININE 0.97 12/16/2022   BUN 13 12/16/2022   NA 139 12/16/2022   K 5.1 12/16/2022   CL 99 12/16/2022   CO2 23 12/16/2022   Lab Results  Component Value Date   ALT 38 (H) 12/16/2022   AST 27 12/16/2022   ALKPHOS 118 12/16/2022   BILITOT 0.5 12/16/2022   Lab Results  Component Value Date   HGBA1C 6.0 (H) 12/16/2022   HGBA1C 6.2 (H) 07/28/2022   HGBA1C 6.1 (H) 03/23/2022   HGBA1C 6.2 (H) 07/17/2021   HGBA1C 6.3 (H) 01/27/2021   Lab Results  Component Value Date   INSULIN 19.3 12/16/2022   INSULIN 28.6 (H) 07/28/2022   INSULIN 10.0 03/23/2022   INSULIN 27.6 (H) 07/17/2021   INSULIN 14.3 01/27/2021   Lab Results  Component  Value Date   TSH 1.540 12/16/2022   Lab Results  Component Value Date   CHOL 201 (H) 12/16/2022   HDL 93 12/16/2022   LDLCALC 90 12/16/2022   TRIG 104 12/16/2022   Lab Results  Component Value Date   VD25OH 35.9 12/16/2022   VD25OH 60.9 07/28/2022   VD25OH 52.2 03/23/2022   Lab Results  Component Value Date   WBC 6.3 12/16/2022   HGB 14.3 12/16/2022   HCT 44.3 12/16/2022   MCV 88 12/16/2022   PLT 323 12/16/2022   Lab Results  Component Value Date   IRON 143  12/16/2022   TIBC 336 12/16/2022   FERRITIN 103 12/16/2022   Attestation Statements:   Reviewed by clinician on day of visit: allergies, medications, problem list, medical history, surgical history, family history, social history, and previous encounter notes.  Time spent on visit including pre-visit chart review and post-visit care and charting was 42 minutes.   I, Trixie Dredge, am acting as transcriptionist for Dennard Nip, MD.  I have reviewed the above documentation for accuracy and completeness, and I agree with the above. -  ***

## 2023-01-06 ENCOUNTER — Ambulatory Visit (INDEPENDENT_AMBULATORY_CARE_PROVIDER_SITE_OTHER): Payer: No Typology Code available for payment source | Admitting: Family Medicine

## 2023-03-09 ENCOUNTER — Ambulatory Visit (INDEPENDENT_AMBULATORY_CARE_PROVIDER_SITE_OTHER): Payer: No Typology Code available for payment source | Admitting: Family Medicine

## 2023-03-11 ENCOUNTER — Encounter (INDEPENDENT_AMBULATORY_CARE_PROVIDER_SITE_OTHER): Payer: Self-pay | Admitting: Family Medicine

## 2023-03-11 DIAGNOSIS — R7303 Prediabetes: Secondary | ICD-10-CM

## 2023-03-11 MED ORDER — SEMAGLUTIDE (2 MG/DOSE) 8 MG/3ML ~~LOC~~ SOPN
2.0000 mg | PEN_INJECTOR | SUBCUTANEOUS | 0 refills | Status: DC
Start: 2023-03-11 — End: 2023-03-25

## 2023-03-11 NOTE — Telephone Encounter (Signed)
Please refill x 90 days. 

## 2023-03-25 ENCOUNTER — Encounter (INDEPENDENT_AMBULATORY_CARE_PROVIDER_SITE_OTHER): Payer: Self-pay | Admitting: Family Medicine

## 2023-03-25 ENCOUNTER — Ambulatory Visit (INDEPENDENT_AMBULATORY_CARE_PROVIDER_SITE_OTHER): Payer: No Typology Code available for payment source | Admitting: Family Medicine

## 2023-03-25 VITALS — BP 120/81 | HR 78 | Temp 98.9°F | Ht 62.0 in | Wt 152.0 lb

## 2023-03-25 DIAGNOSIS — E669 Obesity, unspecified: Secondary | ICD-10-CM | POA: Diagnosis not present

## 2023-03-25 DIAGNOSIS — G43809 Other migraine, not intractable, without status migrainosus: Secondary | ICD-10-CM | POA: Diagnosis not present

## 2023-03-25 DIAGNOSIS — E119 Type 2 diabetes mellitus without complications: Secondary | ICD-10-CM | POA: Insufficient documentation

## 2023-03-25 DIAGNOSIS — E559 Vitamin D deficiency, unspecified: Secondary | ICD-10-CM | POA: Diagnosis not present

## 2023-03-25 DIAGNOSIS — E1169 Type 2 diabetes mellitus with other specified complication: Secondary | ICD-10-CM

## 2023-03-25 DIAGNOSIS — Z6827 Body mass index (BMI) 27.0-27.9, adult: Secondary | ICD-10-CM | POA: Insufficient documentation

## 2023-03-25 DIAGNOSIS — Z794 Long term (current) use of insulin: Secondary | ICD-10-CM

## 2023-03-25 DIAGNOSIS — Z7985 Long-term (current) use of injectable non-insulin antidiabetic drugs: Secondary | ICD-10-CM

## 2023-03-25 DIAGNOSIS — Z6828 Body mass index (BMI) 28.0-28.9, adult: Secondary | ICD-10-CM | POA: Insufficient documentation

## 2023-03-25 MED ORDER — UBRELVY 100 MG PO TABS: 100.0000 mg | ORAL_TABLET | Freq: Every day | ORAL | 0 refills | Status: DC

## 2023-03-25 MED ORDER — VITAMIN D (ERGOCALCIFEROL) 1.25 MG (50000 UNIT) PO CAPS: 50000.0000 [IU] | ORAL_CAPSULE | ORAL | 0 refills | Status: DC

## 2023-03-25 MED ORDER — SEMAGLUTIDE (2 MG/DOSE) 8 MG/3ML ~~LOC~~ SOPN: 2.0000 mg | PEN_INJECTOR | SUBCUTANEOUS | 0 refills | Status: DC

## 2023-03-30 ENCOUNTER — Ambulatory Visit (INDEPENDENT_AMBULATORY_CARE_PROVIDER_SITE_OTHER): Payer: No Typology Code available for payment source | Admitting: Family Medicine

## 2023-03-30 NOTE — Progress Notes (Signed)
Chief Complaint:   OBESITY Heather Moreno is here to discuss her progress with her obesity treatment plan along with follow-up of her obesity related diagnoses. Heather Moreno is on keeping a food journal and adhering to recommended goals of 1500 calories and 90+ grams of protein and states she is following her eating plan approximately 65% of the time. Heather Moreno states she is walking for 30 minutes 1-2 times per week.  Today's visit was #: 29 Starting weight: 161 lbs Starting date: 06/30/2018 Today's weight: 152 lbs Today's date: 03/25/2023 Total lbs lost to date: 9 Total lbs lost since last in-office visit: 0  Interim History: Heather Moreno is struggling more with weight loss.  She notes eating later at night and somewhat skipping meals.  Her protein intake has drifted down.  She is working on getting back on track.  Subjective:   1. Type 2 diabetes mellitus with other specified complication, with long-term current use of insulin (HCC) Courtnei is on Ozempic, and she notes decrease in polyphagia.  She tried metformin in the past.  She has had a history of an A1c of 6.5 in the past.  2. Vitamin D deficiency Heather Moreno's last vitamin D level was below goal.  She has been off vitamin D prescription, and she notes fatigue.  3. Other migraine without status migrainosus, not intractable Heather Moreno was started on Ubrelvy, she is doing well and she requests a refill today.  Assessment/Plan:   1. Type 2 diabetes mellitus with other specified complication, with long-term current use of insulin (HCC) Karlisa will continue Ozempic at 2 mg once weekly, and we will refill for 90 days.  We will continue to monitor.  - Semaglutide, 2 MG/DOSE, 8 MG/3ML SOPN; Inject 2 mg as directed once a week.  Dispense: 9 mL; Refill: 0  2. Vitamin D deficiency Heather Moreno agreed to restart prescription vitamin D 50,000 IU once weekly, and we will refill for 90 days.  - Vitamin D, Ergocalciferol, (DRISDOL) 1.25 MG (50000 UNIT) CAPS capsule; Take 1 capsule  (50,000 Units total) by mouth every 7 (seven) days.  Dispense: 12 capsule; Refill: 0  3. Other migraine without status migrainosus, not intractable Heather Moreno will continue Ubrelvy, and we will refill for 90 days.  - Ubrogepant (UBRELVY) 100 MG TABS; Take 1 tablet (100 mg total) by mouth daily.  Dispense: 90 tablet; Refill: 0  4. BMI 27.0-27.9,adult  5. Obesity, Beginning BMI 29.45 Heather Moreno is currently in the action stage of change. As such, her goal is to continue with weight loss efforts. She has agreed to keeping a food journal and adhering to recommended goals of 1300-1500 calories and 90+ grams of protein daily.   Exercise goals: As is.  Behavioral modification strategies: increasing lean protein intake and no skipping meals.  Shauntee has agreed to follow-up with our clinic in 4 weeks. She was informed of the importance of frequent follow-up visits to maximize her success with intensive lifestyle modifications for her multiple health conditions.   Objective:   Blood pressure 120/81, pulse 78, temperature 98.9 F (37.2 C), height 5\' 2"  (1.575 m), weight 152 lb (68.9 kg), SpO2 97 %. Body mass index is 27.8 kg/m.  Lab Results  Component Value Date   CREATININE 0.97 12/16/2022   BUN 13 12/16/2022   NA 139 12/16/2022   K 5.1 12/16/2022   CL 99 12/16/2022   CO2 23 12/16/2022   Lab Results  Component Value Date   ALT 38 (H) 12/16/2022   AST 27 12/16/2022  ALKPHOS 118 12/16/2022   BILITOT 0.5 12/16/2022   Lab Results  Component Value Date   HGBA1C 6.0 (H) 12/16/2022   HGBA1C 6.2 (H) 07/28/2022   HGBA1C 6.1 (H) 03/23/2022   HGBA1C 6.2 (H) 07/17/2021   HGBA1C 6.3 (H) 01/27/2021   Lab Results  Component Value Date   INSULIN 19.3 12/16/2022   INSULIN 28.6 (H) 07/28/2022   INSULIN 10.0 03/23/2022   INSULIN 27.6 (H) 07/17/2021   INSULIN 14.3 01/27/2021   Lab Results  Component Value Date   TSH 1.540 12/16/2022   Lab Results  Component Value Date   CHOL 201 (H) 12/16/2022    HDL 93 12/16/2022   LDLCALC 90 12/16/2022   TRIG 104 12/16/2022   Lab Results  Component Value Date   VD25OH 35.9 12/16/2022   VD25OH 60.9 07/28/2022   VD25OH 52.2 03/23/2022   Lab Results  Component Value Date   WBC 6.3 12/16/2022   HGB 14.3 12/16/2022   HCT 44.3 12/16/2022   MCV 88 12/16/2022   PLT 323 12/16/2022   Lab Results  Component Value Date   IRON 143 12/16/2022   TIBC 336 12/16/2022   FERRITIN 103 12/16/2022   Attestation Statements:   Reviewed by clinician on day of visit: allergies, medications, problem list, medical history, surgical history, family history, social history, and previous encounter notes.   I, Burt Knack, am acting as transcriptionist for Quillian Quince, MD.  I have reviewed the above documentation for accuracy and completeness, and I agree with the above. -  Quillian Quince, MD

## 2023-04-28 ENCOUNTER — Ambulatory Visit (INDEPENDENT_AMBULATORY_CARE_PROVIDER_SITE_OTHER): Payer: No Typology Code available for payment source | Admitting: Family Medicine

## 2023-04-28 ENCOUNTER — Encounter (INDEPENDENT_AMBULATORY_CARE_PROVIDER_SITE_OTHER): Payer: Self-pay | Admitting: Family Medicine

## 2023-04-28 VITALS — BP 102/65 | HR 79 | Temp 98.3°F | Ht 62.0 in | Wt 153.0 lb

## 2023-04-28 DIAGNOSIS — Z6828 Body mass index (BMI) 28.0-28.9, adult: Secondary | ICD-10-CM

## 2023-04-28 DIAGNOSIS — Z794 Long term (current) use of insulin: Secondary | ICD-10-CM

## 2023-04-28 DIAGNOSIS — F439 Reaction to severe stress, unspecified: Secondary | ICD-10-CM | POA: Diagnosis not present

## 2023-04-28 DIAGNOSIS — Z6827 Body mass index (BMI) 27.0-27.9, adult: Secondary | ICD-10-CM | POA: Diagnosis not present

## 2023-04-28 DIAGNOSIS — Z7985 Long-term (current) use of injectable non-insulin antidiabetic drugs: Secondary | ICD-10-CM

## 2023-04-28 DIAGNOSIS — E1169 Type 2 diabetes mellitus with other specified complication: Secondary | ICD-10-CM | POA: Diagnosis not present

## 2023-04-28 DIAGNOSIS — E669 Obesity, unspecified: Secondary | ICD-10-CM | POA: Diagnosis not present

## 2023-04-28 MED ORDER — SEMAGLUTIDE (2 MG/DOSE) 8 MG/3ML ~~LOC~~ SOPN: 2.0000 mg | PEN_INJECTOR | SUBCUTANEOUS | 0 refills | Status: DC

## 2023-05-03 NOTE — Progress Notes (Unsigned)
Chief Complaint:   OBESITY Heather Moreno is here to discuss her progress with her obesity treatment plan along with follow-up of her obesity related diagnoses. Heather Moreno is on keeping a food journal and adhering to recommended goals of 1300-1500 calories and 90+ grams of protein and states she is following her eating plan approximately 60% of the time. Heather Moreno states she is walking for 30-40 minutes 2-3 times per week.  Today's visit was #: 30 Starting weight: 161 lbs Starting date: 06/30/2018 Today's weight: 153 lbs Today's date: 04/28/2023 Total lbs lost to date: 8 Total lbs lost since last in-office visit: 0  Interim History: Patient has done better with increasing her exercise and she is doing better with her meal plan.  She is actually down in fat and up in water.  She is working on Orthoptist and trying to increase her protein intake.  Subjective:   1. Stress Patient is dealing with a lot of family stressors, especially related to her mother.  She notes decreased sleep quality and increased irritability but this could be due to menopause.  2. Type 2 diabetes mellitus with other specified complication, with long-term current use of insulin (HCC) Patient continues to work on her diet, exercise, and weight loss to help prevent diabetes mellitus.  She is tolerating Ozempic and she notes decreased polyphagia.  Assessment/Plan:   1. Stress Patient was offered support and encouragement.  She is working on stress reduction strategies.  We will continue to follow closely and may consider medication options in the future.  2. Type 2 diabetes mellitus with other specified complication, with long-term current use of insulin (HCC) Patient will continue Ozempic 2 mg once weekly, and we will refill for 90 days.  - Semaglutide, 2 MG/DOSE, 8 MG/3ML SOPN; Inject 2 mg as directed once a week.  Dispense: 9 mL; Refill: 0  3. BMI 28.0-28.9,adult  4. Obesity, Beginning BMI 29.45 Heather Moreno is currently in the  action stage of change. As such, her goal is to continue with weight loss efforts. She has agreed to keeping a food journal and adhering to recommended goals of 1300-1500 calories and 90+ grams of protein daily.   Exercise goals: A sis.   Behavioral modification strategies: increasing lean protein intake.  Archita has agreed to follow-up with our clinic in 3 weeks. She was informed of the importance of frequent follow-up visits to maximize her success with intensive lifestyle modifications for her multiple health conditions.   Objective:   Blood pressure 102/65, pulse 79, temperature 98.3 F (36.8 C), height 5\' 2"  (1.575 m), weight 153 lb (69.4 kg), SpO2 97 %. Body mass index is 27.98 kg/m.  Lab Results  Component Value Date   CREATININE 0.97 12/16/2022   BUN 13 12/16/2022   NA 139 12/16/2022   K 5.1 12/16/2022   CL 99 12/16/2022   CO2 23 12/16/2022   Lab Results  Component Value Date   ALT 38 (H) 12/16/2022   AST 27 12/16/2022   ALKPHOS 118 12/16/2022   BILITOT 0.5 12/16/2022   Lab Results  Component Value Date   HGBA1C 6.0 (H) 12/16/2022   HGBA1C 6.2 (H) 07/28/2022   HGBA1C 6.1 (H) 03/23/2022   HGBA1C 6.2 (H) 07/17/2021   HGBA1C 6.3 (H) 01/27/2021   Lab Results  Component Value Date   INSULIN 19.3 12/16/2022   INSULIN 28.6 (H) 07/28/2022   INSULIN 10.0 03/23/2022   INSULIN 27.6 (H) 07/17/2021   INSULIN 14.3 01/27/2021   Lab Results  Component Value Date   TSH 1.540 12/16/2022   Lab Results  Component Value Date   CHOL 201 (H) 12/16/2022   HDL 93 12/16/2022   LDLCALC 90 12/16/2022   TRIG 104 12/16/2022   Lab Results  Component Value Date   VD25OH 35.9 12/16/2022   VD25OH 60.9 07/28/2022   VD25OH 52.2 03/23/2022   Lab Results  Component Value Date   WBC 6.3 12/16/2022   HGB 14.3 12/16/2022   HCT 44.3 12/16/2022   MCV 88 12/16/2022   PLT 323 12/16/2022   Lab Results  Component Value Date   IRON 143 12/16/2022   TIBC 336 12/16/2022   FERRITIN  103 12/16/2022   Attestation Statements:   Reviewed by clinician on day of visit: allergies, medications, problem list, medical history, surgical history, family history, social history, and previous encounter notes.   I, Burt Knack, am acting as transcriptionist for Quillian Quince, MD.  I have reviewed the above documentation for accuracy and completeness, and I agree with the above. -  Quillian Quince, MD

## 2023-06-16 ENCOUNTER — Ambulatory Visit (INDEPENDENT_AMBULATORY_CARE_PROVIDER_SITE_OTHER): Payer: No Typology Code available for payment source | Admitting: Family Medicine

## 2023-06-22 ENCOUNTER — Other Ambulatory Visit (INDEPENDENT_AMBULATORY_CARE_PROVIDER_SITE_OTHER): Payer: Self-pay | Admitting: Family Medicine

## 2023-06-22 DIAGNOSIS — E559 Vitamin D deficiency, unspecified: Secondary | ICD-10-CM

## 2023-07-07 ENCOUNTER — Ambulatory Visit (INDEPENDENT_AMBULATORY_CARE_PROVIDER_SITE_OTHER): Payer: No Typology Code available for payment source | Admitting: Family Medicine

## 2023-07-07 ENCOUNTER — Encounter (INDEPENDENT_AMBULATORY_CARE_PROVIDER_SITE_OTHER): Payer: Self-pay | Admitting: Family Medicine

## 2023-07-07 VITALS — BP 113/70 | HR 93 | Temp 98.1°F | Ht 62.0 in | Wt 153.0 lb

## 2023-07-07 DIAGNOSIS — E782 Mixed hyperlipidemia: Secondary | ICD-10-CM

## 2023-07-07 DIAGNOSIS — G43809 Other migraine, not intractable, without status migrainosus: Secondary | ICD-10-CM | POA: Diagnosis not present

## 2023-07-07 DIAGNOSIS — Z6827 Body mass index (BMI) 27.0-27.9, adult: Secondary | ICD-10-CM

## 2023-07-07 DIAGNOSIS — R7303 Prediabetes: Secondary | ICD-10-CM | POA: Diagnosis not present

## 2023-07-07 DIAGNOSIS — Z6828 Body mass index (BMI) 28.0-28.9, adult: Secondary | ICD-10-CM

## 2023-07-07 DIAGNOSIS — E669 Obesity, unspecified: Secondary | ICD-10-CM

## 2023-07-07 DIAGNOSIS — E559 Vitamin D deficiency, unspecified: Secondary | ICD-10-CM | POA: Diagnosis not present

## 2023-07-07 MED ORDER — VITAMIN D (ERGOCALCIFEROL) 1.25 MG (50000 UNIT) PO CAPS: 50000.0000 [IU] | ORAL_CAPSULE | ORAL | 0 refills | Status: DC

## 2023-07-07 MED ORDER — UBRELVY 100 MG PO TABS: 100.0000 mg | ORAL_TABLET | Freq: Every day | ORAL | 0 refills | Status: DC

## 2023-07-07 MED ORDER — ATORVASTATIN CALCIUM 10 MG PO TABS: ORAL_TABLET | ORAL | 1 refills | Status: DC

## 2023-07-13 NOTE — Progress Notes (Signed)
Chief Complaint:   OBESITY Heather Moreno is here to discuss her progress with her obesity treatment plan along with follow-up of her obesity related diagnoses. Heather Moreno is on keeping a food journal and adhering to recommended goals of 1300-1500 calories and 90+ grams of protein and states she is following her eating plan approximately 70% of the time. Heather Moreno states she is walking for 30 minutes 3 times per week.  Today's visit was #: 31 Starting weight: 161 lbs Starting date: 06/30/2018 Today's weight: 153 lbs Today's date: 07/07/2023 Total lbs lost to date: 8 Total lbs lost since last in-office visit: 0  Interim History: Patient has a on vacation in [place].  She did indulge in local food, but she worked to portion control and she did a lot more walking than normal.  Subjective:   1. Vitamin D deficiency Patient is stable on vitamin D, and she is due for labs soon.  2. Mixed hyperlipidemia Patient is on Lipitor, and she is working on her diet to help decrease her LDL.  3. Other migraine without status migrainosus, not intractable Patient is stable on Ubrelvy, and she requests a refill today.  No side effects were noted.  4. Prediabetes Patient is on Ozempic, and she is working on her diet.  She is due to have labs.  Assessment/Plan:   1. Vitamin D deficiency We will check labs today, and we will follow-up at patient's next visit.  We will refill prescription vitamin D once weekly for 90 days.  - Vitamin D, Ergocalciferol, (DRISDOL) 1.25 MG (50000 UNIT) CAPS capsule; Take 1 capsule (50,000 Units total) by mouth every 7 (seven) days.  Dispense: 12 capsule; Refill: 0 - VITAMIN D 25 Hydroxy (Vit-D Deficiency, Fractures)  2. Mixed hyperlipidemia We will check labs today, and we will follow-up at patient's next visit.  We will refill Lipitor 10 mg once daily for 90 days.  - atorvastatin (LIPITOR) 10 MG tablet; TAKE 1 TABLET BY MOUTH EVERYDAY AT BEDTIME  Dispense: 90 tablet; Refill: 1 -  Lipid Panel With LDL/HDL Ratio - CBC with Differential/Platelet  3. Other migraine without status migrainosus, not intractable Patient will continue Ubrelvy 100 mg daily, and we will refill for 90 days.  - Ubrogepant (UBRELVY) 100 MG TABS; Take 1 tablet (100 mg total) by mouth daily.  Dispense: 90 tablet; Refill: 0  4. Prediabetes We will check labs today, and we will follow-up at patient's next visit.  - Hemoglobin A1c - Insulin, random - CMP14+EGFR - Vitamin B12  5. BMI 28.0-28.9,adult  6. Obesity, Beginning BMI 29.45 Heather Moreno is currently in the action stage of change. As such, her goal is to continue with weight loss efforts. She has agreed to keeping a food journal and adhering to recommended goals of 1300-1500 calories and 90+ grams of protein daily.   Exercise goals: As is.   Behavioral modification strategies: increasing lean protein intake and meal planning and cooking strategies.  Heather Moreno has agreed to follow-up with our clinic in 3 months. She was informed of the importance of frequent follow-up visits to maximize her success with intensive lifestyle modifications for her multiple health conditions.   Heather Moreno was informed we would discuss her lab results at her next visit unless there is a critical issue that needs to be addressed sooner. Heather Moreno agreed to keep her next visit at the agreed upon time to discuss these results.  Objective:   Blood pressure 113/70, pulse 93, temperature 98.1 F (36.7 C), height 5\' 2"  (  1.575 m), weight 153 lb (69.4 kg), SpO2 97%. Body mass index is 27.98 kg/m.  Lab Results  Component Value Date   CREATININE 0.97 12/16/2022   BUN 13 12/16/2022   NA 139 12/16/2022   K 5.1 12/16/2022   CL 99 12/16/2022   CO2 23 12/16/2022   Lab Results  Component Value Date   ALT 38 (H) 12/16/2022   AST 27 12/16/2022   ALKPHOS 118 12/16/2022   BILITOT 0.5 12/16/2022   Lab Results  Component Value Date   HGBA1C 6.0 (H) 12/16/2022   HGBA1C 6.2 (H)  07/28/2022   HGBA1C 6.1 (H) 03/23/2022   HGBA1C 6.2 (H) 07/17/2021   HGBA1C 6.3 (H) 01/27/2021   Lab Results  Component Value Date   INSULIN 19.3 12/16/2022   INSULIN 28.6 (H) 07/28/2022   INSULIN 10.0 03/23/2022   INSULIN 27.6 (H) 07/17/2021   INSULIN 14.3 01/27/2021   Lab Results  Component Value Date   TSH 1.540 12/16/2022   Lab Results  Component Value Date   CHOL 201 (H) 12/16/2022   HDL 93 12/16/2022   LDLCALC 90 12/16/2022   TRIG 104 12/16/2022   Lab Results  Component Value Date   VD25OH 35.9 12/16/2022   VD25OH 60.9 07/28/2022   VD25OH 52.2 03/23/2022   Lab Results  Component Value Date   WBC 6.3 12/16/2022   HGB 14.3 12/16/2022   HCT 44.3 12/16/2022   MCV 88 12/16/2022   PLT 323 12/16/2022   Lab Results  Component Value Date   IRON 143 12/16/2022   TIBC 336 12/16/2022   FERRITIN 103 12/16/2022   Attestation Statements:   Reviewed by clinician on day of visit: allergies, medications, problem list, medical history, surgical history, family history, social history, and previous encounter notes.   I, Burt Knack, am acting as transcriptionist for Quillian Quince, MD.  I have reviewed the above documentation for accuracy and completeness, and I agree with the above. -  Quillian Quince, MD

## 2023-10-07 ENCOUNTER — Ambulatory Visit (INDEPENDENT_AMBULATORY_CARE_PROVIDER_SITE_OTHER): Payer: No Typology Code available for payment source | Admitting: Family Medicine

## 2023-10-07 ENCOUNTER — Telehealth (INDEPENDENT_AMBULATORY_CARE_PROVIDER_SITE_OTHER): Payer: Self-pay

## 2023-10-07 ENCOUNTER — Encounter (INDEPENDENT_AMBULATORY_CARE_PROVIDER_SITE_OTHER): Payer: Self-pay | Admitting: Family Medicine

## 2023-10-07 VITALS — BP 106/68 | HR 91 | Temp 98.1°F | Ht 62.0 in | Wt 153.0 lb

## 2023-10-07 DIAGNOSIS — E119 Type 2 diabetes mellitus without complications: Secondary | ICD-10-CM

## 2023-10-07 DIAGNOSIS — Z6828 Body mass index (BMI) 28.0-28.9, adult: Secondary | ICD-10-CM

## 2023-10-07 DIAGNOSIS — Z7985 Long-term (current) use of injectable non-insulin antidiabetic drugs: Secondary | ICD-10-CM

## 2023-10-07 DIAGNOSIS — E782 Mixed hyperlipidemia: Secondary | ICD-10-CM | POA: Diagnosis not present

## 2023-10-07 DIAGNOSIS — G43809 Other migraine, not intractable, without status migrainosus: Secondary | ICD-10-CM

## 2023-10-07 DIAGNOSIS — E559 Vitamin D deficiency, unspecified: Secondary | ICD-10-CM

## 2023-10-07 DIAGNOSIS — E669 Obesity, unspecified: Secondary | ICD-10-CM

## 2023-10-07 MED ORDER — ATORVASTATIN CALCIUM 10 MG PO TABS
ORAL_TABLET | ORAL | 1 refills | Status: DC
Start: 2023-10-07 — End: 2023-12-30

## 2023-10-07 MED ORDER — UBRELVY 100 MG PO TABS
100.0000 mg | ORAL_TABLET | Freq: Every day | ORAL | 0 refills | Status: DC
Start: 2023-10-07 — End: 2023-12-30

## 2023-10-07 MED ORDER — SEMAGLUTIDE (2 MG/DOSE) 8 MG/3ML ~~LOC~~ SOPN
2.0000 mg | PEN_INJECTOR | SUBCUTANEOUS | 0 refills | Status: DC
Start: 2023-10-07 — End: 2023-12-30

## 2023-10-07 MED ORDER — VITAMIN D (ERGOCALCIFEROL) 1.25 MG (50000 UNIT) PO CAPS
50000.0000 [IU] | ORAL_CAPSULE | ORAL | 0 refills | Status: DC
Start: 2023-10-07 — End: 2024-03-23

## 2023-10-07 NOTE — Progress Notes (Signed)
[facility]  Office: [phone]  /  Fax: [phone]  WEIGHT SUMMARY AND BIOMETRICS  Anthropometric Measurements Height: 5\' 2"  (1.575 m) Weight: 153 lb (69.4 kg) BMI (Calculated): 27.98 Weight at Last Visit: 153 lb Weight Lost Since Last Visit: 0 Weight Gained Since Last Visit: 0 Starting Weight: 161 lb Total Weight Loss (lbs): 8 lb (3.629 kg)   Body Composition  Body Fat %: 30.5 % Fat Mass (lbs): 46.8 lbs Muscle Mass (lbs): 101 lbs Total Body Water (lbs): 69.4 lbs Visceral Fat Rating : 7   Other Clinical Data Fasting: Yes Labs: Yes Today's Visit #: 32 Starting Date: 06/30/18    Chief Complaint: OBESITY    History of Present Illness   The patient, with a history of type 2 diabetes, vitamin D deficiency, migraine headaches, and hyperlipidemia, presents for a routine follow-up. She has been on Ozempic 2 mg weekly for diabetes management and has been making dietary changes, including reducing simple carbohydrates. Despite these efforts, she has maintained her weight over the past three months. She has been journaling her food intake with a calorie goal of 1590 and aims to consume more protein. However, she reports no current exercise regimen.  The patient also has a history of vitamin D deficiency and is on prescription vitamin D 50,000 international units per day. She reports high stress levels but denies stress eating. She admits to consuming junk food when it's available, especially when her children are home from college.  The patient's migraines have been a significant concern. She reports an increase in migraine frequency since becoming postmenopausal, which she finds puzzling as her migraines were previously hormone-related. She suspects that her migraines may now be stress-related. She also notes that dehydration can trigger her migraines.  The patient acknowledges the need for physical activity but cites several barriers, including a busy work schedule, the need for rest  on weekends, and the triggering of migraines by exercise. She reports walking around her neighborhood occasionally.  The patient is also on Lipitor for hyperlipidemia and Ubrelvy for migraines, which she describes as a "life saver." She reports no issues with these medications. She also mentions constipation as a side effect of Ozempic, which she manages with a probiotic (Florastor) and dietary changes, including the consumption of prunes.          PHYSICAL EXAM:  Blood pressure 106/68, pulse 91, temperature 98.1 F (36.7 C), height 5\' 2"  (1.575 m), weight 153 lb (69.4 kg), SpO2 98%. Body mass index is 27.98 kg/m.  DIAGNOSTIC DATA REVIEWED:  BMET    Component Value Date/Time   NA 139 12/16/2022 1202   K 5.1 12/16/2022 1202   CL 99 12/16/2022 1202   CO2 23 12/16/2022 1202   GLUCOSE 88 12/16/2022 1202   BUN 13 12/16/2022 1202   CREATININE 0.97 12/16/2022 1202   CALCIUM 10.2 12/16/2022 1202   GFRNONAA 78 08/21/2020 0742   GFRAA 90 08/21/2020 0742   Lab Results  Component Value Date   HGBA1C 6.0 (H) 12/16/2022   HGBA1C 6.3 (H) 06/30/2018   Lab Results  Component Value Date   INSULIN 19.3 12/16/2022   INSULIN 25.3 (H) 06/30/2018   Lab Results  Component Value Date   TSH 1.540 12/16/2022   CBC    Component Value Date/Time   WBC 6.3 12/16/2022 1202   RBC 5.05 12/16/2022 1202   HGB 14.3 12/16/2022 1202   HCT 44.3 12/16/2022 1202   PLT 323 12/16/2022 1202   MCV 88 12/16/2022 1202  MCH 28.3 12/16/2022 1202   MCHC 32.3 12/16/2022 1202   RDW 12.8 12/16/2022 1202   Iron Studies    Component Value Date/Time   IRON 143 12/16/2022 1202   TIBC 336 12/16/2022 1202   FERRITIN 103 12/16/2022 1202   IRONPCTSAT 43 12/16/2022 1202   Lipid Panel     Component Value Date/Time   CHOL 201 (H) 12/16/2022 1202   TRIG 104 12/16/2022 1202   HDL 93 12/16/2022 1202   LDLCALC 90 12/16/2022 1202   Hepatic Function Panel     Component Value Date/Time   PROT 7.6 12/16/2022  1202   ALBUMIN 4.5 12/16/2022 1202   AST 27 12/16/2022 1202   ALT 38 (H) 12/16/2022 1202   ALKPHOS 118 12/16/2022 1202   BILITOT 0.5 12/16/2022 1202      Component Value Date/Time   TSH 1.540 12/16/2022 1202   Nutritional Lab Results  Component Value Date   VD25OH 35.9 12/16/2022   VD25OH 60.9 07/28/2022   VD25OH 52.2 03/23/2022     Assessment and Plan    obesity and Type 2 Diabetes Stable on Ozempic 2mg  weekly. Maintained weight and working on dietary modifications. Not currently exercising due to migraines. -Continue Ozempic 2mg  weekly. -Encourage consistent exercise, even light walking. -Check labs today.  Vitamin D Deficiency On prescription Vitamin D 50,000 international units per day. -Continue Vitamin D 50,000 international units per day. -Refill prescription.  Migraine Headaches Increased frequency post-menopause. Triggers identified as stress, poor sleep, and dehydration. Bernita Raisin has been effective. -Continue Ubrelvy as needed. -Encourage stress management, good sleep hygiene, and adequate hydration.  Hyperlipidemia Stable on Atorvastatin. No reported side effects. -Continue Atorvastatin. -Check labs today.  Follow-up in 3 months.          She was informed of the importance of frequent follow up visits to maximize her success with intensive lifestyle modifications for her multiple health conditions.    Quillian Quince, MD

## 2023-10-07 NOTE — Telephone Encounter (Signed)
Prior [place] submitted for [employer] Awaiting determination.

## 2023-10-08 LAB — HEMOGLOBIN A1C
Est. average glucose Bld gHb Est-mCnc: 128 mg/dL
Hgb A1c MFr Bld: 6.1 % — ABNORMAL HIGH (ref 4.8–5.6)

## 2023-10-08 LAB — CMP14+EGFR
ALT: 34 [IU]/L — ABNORMAL HIGH (ref 0–32)
AST: 21 [IU]/L (ref 0–40)
Albumin: 4.7 g/dL (ref 3.8–4.9)
Alkaline Phosphatase: 132 [IU]/L — ABNORMAL HIGH (ref 44–121)
BUN/Creatinine Ratio: 14 (ref 9–23)
BUN: 13 mg/dL (ref 6–24)
Bilirubin Total: 0.4 mg/dL (ref 0.0–1.2)
CO2: 22 mmol/L (ref 20–29)
Calcium: 10.1 mg/dL (ref 8.7–10.2)
Chloride: 98 mmol/L (ref 96–106)
Creatinine, Ser: 0.92 mg/dL (ref 0.57–1.00)
Globulin, Total: 2.8 g/dL (ref 1.5–4.5)
Glucose: 101 mg/dL — ABNORMAL HIGH (ref 70–99)
Potassium: 4.6 mmol/L (ref 3.5–5.2)
Sodium: 137 mmol/L (ref 134–144)
Total Protein: 7.5 g/dL (ref 6.0–8.5)
eGFR: 75 mL/min/{1.73_m2} (ref >59)

## 2023-10-08 LAB — LIPID PANEL WITH LDL/HDL RATIO
Cholesterol, Total: 201 mg/dL — ABNORMAL HIGH (ref 100–199)
HDL: 87 mg/dL (ref >39)
LDL Chol Calc (NIH): 91 mg/dL (ref 0–99)
LDL/HDL Ratio: 1 ratio (ref 0.0–3.2)
Triglycerides: 134 mg/dL (ref 0–149)
VLDL Cholesterol Cal: 23 mg/dL (ref 5–40)

## 2023-10-08 LAB — TSH: TSH: 2.1 u[IU]/mL (ref 0.450–4.500)

## 2023-10-08 LAB — VITAMIN B12: Vitamin B-12: 750 pg/mL (ref 232–1245)

## 2023-10-08 LAB — VITAMIN D 25 HYDROXY (VIT D DEFICIENCY, FRACTURES): Vit D, 25-Hydroxy: 37.9 ng/mL (ref 30.0–100.0)

## 2023-10-08 LAB — FOLATE: Folate: 13.3 ng/mL (ref >3.0)

## 2023-10-08 LAB — INSULIN, RANDOM: INSULIN: 24.9 u[IU]/mL (ref 2.6–24.9)

## 2023-10-11 NOTE — Telephone Encounter (Signed)
We have denied your request because it is for more than the amount your plan covers (quantity limit). We reviewed the information we had. We have partially approved your request for this drug up to the amount your plan covers 16 tablets per month of Nurtec ODT 75 mg. Your request for more drug has been denied.

## 2023-11-02 ENCOUNTER — Telehealth (INDEPENDENT_AMBULATORY_CARE_PROVIDER_SITE_OTHER): Payer: Self-pay | Admitting: *Deleted

## 2023-11-02 NOTE — Telephone Encounter (Signed)
Prior authorization has been submitted to plan, waiting for approval from insurance for medication (Ozempic-2 mg dosage).11/02/2023

## 2023-11-03 NOTE — Telephone Encounter (Signed)
PA for Ozempic 2MG  has been denied.   Date: 11/02/2023 Heather Moreno [address] [place], [place] [address]  Prescriber Name: Quillian Quince Prescriber Fax: [phone] Dear [name]: [facility] received a prior authorization request from your doctor for coverage of Ozempic (semaglutide) for you. The request was denied for the following reason: Your plan only covers this drug when A) your A1C is sent to [place], and your test results are in a certain range (A1C greater than or equal to 6.5 percent), B) your 2-hour plasma glucose (PG) during oral glucose tolerance test (OGTT) is sent to [place], and your results are in a certain range (2-hour PG greater than or equal to 200 mg/dL), C) your random plasma glucose is sent to [place], and your test results are in a certain range (random plasma glucose greater than or equal to 200 mg/dL with symptoms of hyperglycemia (e.g., polyuria, polydipsia, polyphagia) or hyperglycemic crisis), or D) your fasting plasma glucose (FPG) is sent to [place], and your results are in a certain range (FPG greater than or equal to 126 mg/dL). We denied your request because: A) We did not receive your results, or B) Your results were not in the approvable range. We reviewed the information we had. Your request has been denied. Your doctor can send [place] any new or missing information for [place] to review. For this drug, you may have to meet other criteria. You can request the drug policy for more details. You can also request other plan documents for your review. You may request a free copy of the criteria or guidelines used in making the decision and any other information related to the determination by calling Customer Care at [phone]. You may choose to purchase the medicine at your own expense. For more information regarding your prescription benefit, please refer to your benefit plan materials. If your prescriber would like to discuss this decision with a clinical  reviewer at [facility], your prescriber can call [facility], and we will arrange to make someone available to speak with your prescriber.

## 2023-11-29 ENCOUNTER — Telehealth (INDEPENDENT_AMBULATORY_CARE_PROVIDER_SITE_OTHER): Payer: Self-pay | Admitting: *Deleted

## 2023-11-29 NOTE — Telephone Encounter (Signed)
PA SUBMITTED VIA COVERMYMEDS  Heather Moreno (Key: [id number])  form thumbnail [facility] has not yet replied to your PA request. Depending on the information you've provided, additional questions may be returned by the plan. You may close this dialog, return to your dashboard, and perform other tasks.  To check for an update later, open this request again from your dashboard.  If [facility] has not replied to your request within 24 hours please contact [facility] at [phone].

## 2023-12-30 ENCOUNTER — Telehealth: Payer: Self-pay

## 2023-12-30 ENCOUNTER — Ambulatory Visit (INDEPENDENT_AMBULATORY_CARE_PROVIDER_SITE_OTHER): Payer: No Typology Code available for payment source | Admitting: Family Medicine

## 2023-12-30 ENCOUNTER — Encounter (INDEPENDENT_AMBULATORY_CARE_PROVIDER_SITE_OTHER): Payer: Self-pay | Admitting: Family Medicine

## 2023-12-30 VITALS — BP 110/74 | HR 95 | Temp 98.2°F | Ht 62.0 in | Wt 151.0 lb

## 2023-12-30 DIAGNOSIS — R7303 Prediabetes: Secondary | ICD-10-CM | POA: Diagnosis not present

## 2023-12-30 DIAGNOSIS — E785 Hyperlipidemia, unspecified: Secondary | ICD-10-CM | POA: Diagnosis not present

## 2023-12-30 DIAGNOSIS — G43809 Other migraine, not intractable, without status migrainosus: Secondary | ICD-10-CM | POA: Diagnosis not present

## 2023-12-30 DIAGNOSIS — Z6827 Body mass index (BMI) 27.0-27.9, adult: Secondary | ICD-10-CM

## 2023-12-30 DIAGNOSIS — E669 Obesity, unspecified: Secondary | ICD-10-CM | POA: Diagnosis not present

## 2023-12-30 DIAGNOSIS — E782 Mixed hyperlipidemia: Secondary | ICD-10-CM

## 2023-12-30 MED ORDER — ATORVASTATIN CALCIUM 10 MG PO TABS: ORAL_TABLET | ORAL | 1 refills | Status: DC

## 2023-12-30 MED ORDER — ZEPBOUND 15 MG/0.5ML ~~LOC~~ SOAJ: 15.0000 mg | SUBCUTANEOUS | 0 refills | Status: DC

## 2023-12-30 MED ORDER — UBRELVY 100 MG PO TABS: 100.0000 mg | ORAL_TABLET | ORAL | 3 refills | Status: DC | PRN

## 2023-12-30 NOTE — Telephone Encounter (Signed)
PA submitted through Cover My Meds for [employer]. Awaiting insurance determination.  Key: [id number]

## 2023-12-30 NOTE — Progress Notes (Signed)
[facility]  Office: [phone]  /  Fax: [phone]  WEIGHT SUMMARY AND BIOMETRICS  Anthropometric Measurements Height: 5\' 2"  (1.575 m) Weight: 151 lb (68.5 kg) BMI (Calculated): 27.61 Weight at Last Visit: 153 lb Weight Lost Since Last Visit: 2 lb Weight Gained Since Last Visit: 0 Starting Weight: 161 lb Total Weight Loss (lbs): 10 lb (4.536 kg)   Body Composition  Body Fat %: 30.5 % Fat Mass (lbs): 46.2 lbs Muscle Mass (lbs): 100.2 lbs Total Body Water (lbs): 69.6 lbs Visceral Fat Rating : 7   Other Clinical Data Fasting: yes Labs: no Today's Visit #: 26 Starting Date: 06/30/18    Chief Complaint: OBESITY   History of Present Illness   The patient presents to discuss her obesity and prediabetes.  She has lost two pounds over the last two and a half months, despite the holiday season, and is working on journaling her diet with a goal of 1500 calories and 85 or more grams of protein daily. She feels she is successful in meeting this goal about 60% of the time. She is not currently exercising regularly.  She has a history of prediabetes and is focusing on weight management to prevent progression to diabetes.  She is on Lipitor for hyperlipidemia and has been working on decreasing her cholesterol intake, which she feels is going well. She requests a refill of her Lipitor.  She is on Ubrelvy for migraines and has received a notice regarding her prescription, indicating a misunderstanding about the quantity prescribed. She requests a refill for Ubrelvy with clarification on the prescription duration.          PHYSICAL EXAM:  Blood pressure 110/74, pulse 95, temperature 98.2 F (36.8 C), height 5\' 2"  (1.575 m), weight 151 lb (68.5 kg), SpO2 99%. Body mass index is 27.62 kg/m.  DIAGNOSTIC DATA REVIEWED:  BMET    Component Value Date/Time   NA 137 10/07/2023 0902   K 4.6 10/07/2023 0902   CL 98 10/07/2023 0902   CO2 22 10/07/2023 0902   GLUCOSE 101 (H)  10/07/2023 0902   BUN 13 10/07/2023 0902   CREATININE 0.92 10/07/2023 0902   CALCIUM 10.1 10/07/2023 0902   GFRNONAA 78 08/21/2020 0742   GFRAA 90 08/21/2020 0742   Lab Results  Component Value Date   HGBA1C 6.1 (H) 10/07/2023   HGBA1C 6.3 (H) 06/30/2018   Lab Results  Component Value Date   INSULIN 24.9 10/07/2023   INSULIN 25.3 (H) 06/30/2018   Lab Results  Component Value Date   TSH 2.100 10/07/2023   CBC    Component Value Date/Time   WBC 6.3 12/16/2022 1202   RBC 5.05 12/16/2022 1202   HGB 14.3 12/16/2022 1202   HCT 44.3 12/16/2022 1202   PLT 323 12/16/2022 1202   MCV 88 12/16/2022 1202   MCH 28.3 12/16/2022 1202   MCHC 32.3 12/16/2022 1202   RDW 12.8 12/16/2022 1202   Iron Studies    Component Value Date/Time   IRON 143 12/16/2022 1202   TIBC 336 12/16/2022 1202   FERRITIN 103 12/16/2022 1202   IRONPCTSAT 43 12/16/2022 1202   Lipid Panel     Component Value Date/Time   CHOL 201 (H) 10/07/2023 0902   TRIG 134 10/07/2023 0902   HDL 87 10/07/2023 0902   LDLCALC 91 10/07/2023 0902   Hepatic Function Panel     Component Value Date/Time   PROT 7.5 10/07/2023 0902   ALBUMIN 4.7 10/07/2023 0902   AST 21 10/07/2023 0902  ALT 34 (H) 10/07/2023 0902   ALKPHOS 132 (H) 10/07/2023 0902   BILITOT 0.4 10/07/2023 0902      Component Value Date/Time   TSH 2.100 10/07/2023 0902   Nutritional Lab Results  Component Value Date   VD25OH 37.9 10/07/2023   VD25OH 35.9 12/16/2022   VD25OH 60.9 07/28/2022     Assessment and Plan    Obesity Obesity with recent 2-pound weight loss over 2.5 months. Following a 1500 calorie diet with 85 grams of protein, achieving 60% adherence. Not exercising regularly. Expressed disappointment over Ozempic denial and concerns about weight rebound. Discussed switching to Zepbound, which has shown slightly better efficacy (100% vs. 95% for Ozempic) in controlling cravings and weight loss. Explained Zepbound's potential  effectiveness in managing neurologic hunger and cravings. Discussed using a Zepbound coupon to reduce out-of-pocket costs if insurance denies coverage. - Prescribe Zepbound, starting at 15 mg due to previous tolerance of 2 mg Ozempic without nausea or vomiting - Submit prior authorization for Zepbound under the diagnosis of obesity with comorbidities - Instruct to use Zepbound coupon from the [employer] if insurance denies coverage - Encourage continuation of current dietary efforts and consider increasing adherence to the 1500 calorie goal - Discuss the importance of incorporating regular exercise into her routine  Prediabetes Prediabetes with suboptimal A1c reduction on Ozempic. Emphasized the importance of regular physical activity to improve insulin sensitivity and potential benefits of Zepbound in managing weight and blood glucose levels. - Monitor blood glucose levels regularly - Continue dietary modifications and consider increasing adherence to the 1500 calorie goal - Encourage regular physical activity to improve insulin sensitivity  Hyperlipidemia Hyperlipidemia managed with Lipitor. Patient is decreasing cholesterol intake successfully. - Refill Lipitor prescription  Migraines Migraines managed with Ubrelvy. Patient requested a refill and mentioned insurance coverage issues for a 90-day supply. Discussed adjusting the prescription to specify a 90-day supply to avoid future issues. - Refill Ubrelvy prescription for 30 pills with instructions for a 90-day supply - Add note to pharmacy to specify that the prescription is for a 90-day supply  Follow-up - Follow up in 3 months to reassess weight management and medication efficacy - Monitor for any updates on insurance coverage for Zepbound.     I have personally spent 49 minutes total time today in preparation, patient care, and documentation for this visit, including the following: review of clinical lab tests; review of  medical tests/procedures/services.    She was informed of the importance of frequent follow up visits to maximize her success with intensive lifestyle modifications for her multiple health conditions.    Quillian Quince, MD

## 2024-01-04 NOTE — Telephone Encounter (Signed)
Medication denied, medication changed to Zepbound.

## 2024-02-29 ENCOUNTER — Other Ambulatory Visit (INDEPENDENT_AMBULATORY_CARE_PROVIDER_SITE_OTHER): Payer: Self-pay | Admitting: Family Medicine

## 2024-02-29 DIAGNOSIS — R7303 Prediabetes: Secondary | ICD-10-CM

## 2024-02-29 DIAGNOSIS — E669 Obesity, unspecified: Secondary | ICD-10-CM

## 2024-02-29 DIAGNOSIS — E559 Vitamin D deficiency, unspecified: Secondary | ICD-10-CM

## 2024-03-08 ENCOUNTER — Encounter (INDEPENDENT_AMBULATORY_CARE_PROVIDER_SITE_OTHER): Payer: Self-pay | Admitting: Family Medicine

## 2024-03-08 DIAGNOSIS — E669 Obesity, unspecified: Secondary | ICD-10-CM

## 2024-03-08 DIAGNOSIS — R7303 Prediabetes: Secondary | ICD-10-CM

## 2024-03-09 MED ORDER — ZEPBOUND 15 MG/0.5ML ~~LOC~~ SOAJ: 15.0000 mg | SUBCUTANEOUS | 0 refills | Status: DC

## 2024-03-23 ENCOUNTER — Encounter (INDEPENDENT_AMBULATORY_CARE_PROVIDER_SITE_OTHER): Payer: Self-pay | Admitting: Family Medicine

## 2024-03-23 ENCOUNTER — Ambulatory Visit (INDEPENDENT_AMBULATORY_CARE_PROVIDER_SITE_OTHER): Payer: No Typology Code available for payment source | Admitting: Family Medicine

## 2024-03-23 VITALS — BP 111/68 | HR 73 | Temp 97.6°F | Ht 62.0 in | Wt 148.0 lb

## 2024-03-23 DIAGNOSIS — G43809 Other migraine, not intractable, without status migrainosus: Secondary | ICD-10-CM | POA: Diagnosis not present

## 2024-03-23 DIAGNOSIS — R7303 Prediabetes: Secondary | ICD-10-CM

## 2024-03-23 DIAGNOSIS — E669 Obesity, unspecified: Secondary | ICD-10-CM

## 2024-03-23 DIAGNOSIS — E559 Vitamin D deficiency, unspecified: Secondary | ICD-10-CM

## 2024-03-23 DIAGNOSIS — R632 Polyphagia: Secondary | ICD-10-CM | POA: Diagnosis not present

## 2024-03-23 DIAGNOSIS — Z6827 Body mass index (BMI) 27.0-27.9, adult: Secondary | ICD-10-CM

## 2024-03-23 DIAGNOSIS — E782 Mixed hyperlipidemia: Secondary | ICD-10-CM

## 2024-03-23 DIAGNOSIS — E785 Hyperlipidemia, unspecified: Secondary | ICD-10-CM

## 2024-03-23 MED ORDER — VITAMIN D (ERGOCALCIFEROL) 1.25 MG (50000 UNIT) PO CAPS: 50000.0000 [IU] | ORAL_CAPSULE | ORAL | 0 refills | Status: DC

## 2024-03-23 MED ORDER — ATORVASTATIN CALCIUM 10 MG PO TABS: ORAL_TABLET | ORAL | 1 refills | Status: DC

## 2024-03-23 MED ORDER — ZEPBOUND 15 MG/0.5ML ~~LOC~~ SOAJ: 15.0000 mg | SUBCUTANEOUS | 0 refills | Status: DC

## 2024-03-23 MED ORDER — UBRELVY 100 MG PO TABS: 100.0000 mg | ORAL_TABLET | ORAL | 3 refills | Status: DC | PRN

## 2024-03-23 NOTE — Progress Notes (Signed)
 Office: [phone]  /  Fax: [phone]  WEIGHT SUMMARY AND BIOMETRICS  Anthropometric Measurements Height: 5\' 2"  (1.575 m) Weight: 148 lb (67.1 kg) BMI (Calculated): 27.06 Weight at Last Visit: 151 lb Weight Lost Since Last Visit: 3 lb Weight Gained Since Last Visit: 0 Starting Weight: 161 lb Total Weight Loss (lbs): 13 lb (5.897 kg) Peak Weight: 163 lb   Body Composition  Body Fat %: 29.1 % Fat Mass (lbs): 43.2 lbs Muscle Mass (lbs): 99.6 lbs Total Body Water (lbs): 69.4 lbs Visceral Fat Rating : 6   Other Clinical Data Fasting: yes Labs: no Today's Visit #: 33    Chief Complaint: OBESITY     History of Present Illness Heather Moreno is a 53 year old female who presents for obesity treatment and progress assessment.  She is managing her obesity by journaling her diet with a goal of 1500 calories and 90 grams of protein daily, achieving this about 55% of the time. She has started walking once a week as part of her physical activity regimen. Over the past three months, she has lost three pounds.  She is on Zepbound  for polyphagia, which helps her feel less hungry compared to when she was on Ozempic . She sometimes needs to remind herself to eat due to decreased hunger. She recently resumed her Zepbound  injections after a delay and requests a refill.  She is taking Lipitor for hyperlipidemia and requests a refill. She is also taking vitamin D  supplements and requests a refill for these as well.  She experiences chronic migraines, which are stable with the use of Ubrelvy . She takes ten pills per month and finds them effective, though she is cautious about using them and saves them for when she is most needed.  She reports challenges in maintaining a consistent healthy eating routine due to personal and family stressors, including the recent passing of her aunt and her uncle's hospitalization. Additionally, her eating habits were disrupted during Ramadan.       PHYSICAL EXAM:  Blood pressure 111/68, pulse 73, temperature 97.6 F (36.4 C), height 5\' 2"  (1.575 m), weight 148 lb (67.1 kg), SpO2 94%. Body mass index is 27.07 kg/m.  DIAGNOSTIC DATA REVIEWED:  BMET    Component Value Date/Time   NA 137 10/07/2023 0902   K 4.6 10/07/2023 0902   CL 98 10/07/2023 0902   CO2 22 10/07/2023 0902   GLUCOSE 101 (H) 10/07/2023 0902   BUN 13 10/07/2023 0902   CREATININE 0.92 10/07/2023 0902   CALCIUM  10.1 10/07/2023 0902   GFRNONAA 78 08/21/2020 0742   GFRAA 90 08/21/2020 0742   Lab Results  Component Value Date   HGBA1C 6.1 (H) 10/07/2023   HGBA1C 6.3 (H) 06/30/2018   Lab Results  Component Value Date   INSULIN  24.9 10/07/2023   INSULIN  25.3 (H) 06/30/2018   Lab Results  Component Value Date   TSH 2.100 10/07/2023   CBC    Component Value Date/Time   WBC 6.3 12/16/2022 1202   RBC 5.05 12/16/2022 1202   HGB 14.3 12/16/2022 1202   HCT 44.3 12/16/2022 1202   PLT 323 12/16/2022 1202   MCV 88 12/16/2022 1202   MCH 28.3 12/16/2022 1202   MCHC 32.3 12/16/2022 1202   RDW 12.8 12/16/2022 1202   Iron Studies    Component Value Date/Time   IRON 143 12/16/2022 1202   TIBC 336 12/16/2022 1202   FERRITIN 103 12/16/2022 1202   IRONPCTSAT 43 12/16/2022 1202   Lipid Panel  Component Value Date/Time   CHOL 201 (H) 10/07/2023 0902   TRIG 134 10/07/2023 0902   HDL 87 10/07/2023 0902   LDLCALC 91 10/07/2023 0902   Hepatic Function Panel     Component Value Date/Time   PROT 7.5 10/07/2023 0902   ALBUMIN 4.7 10/07/2023 0902   AST 21 10/07/2023 0902   ALT 34 (H) 10/07/2023 0902   ALKPHOS 132 (H) 10/07/2023 0902   BILITOT 0.4 10/07/2023 0902      Component Value Date/Time   TSH 2.100 10/07/2023 0902   Nutritional Lab Results  Component Value Date   VD25OH 37.9 10/07/2023   VD25OH 35.9 12/16/2022   VD25OH 60.9 07/28/2022     Assessment and Plan Assessment & Plan Obesity Obesity management is ongoing with a 1500  calorie diet and a goal of 90 grams of protein daily, achieved 55% of the time. She engages in walking once a week and has lost 3 pounds over the last 3 months. Challenges include meal planning and maintaining healthy habits due to personal and family stressors. - Continue current diet and exercise regimen - Encourage increased physical activity - Provide support for meal planning and healthy habit formation  Polyphagia Polyphagia is managed with Zepbound . She reports reduced hunger and sometimes needs reminders to eat. No major side effects reported. - Refill Zepbound  prescription  Hyperlipidemia Hyperlipidemia is managed with Lipitor. No new symptoms or concerns reported. - Refill Lipitor prescription  Vitamin D  deficiency Vitamin D  deficiency is managed with supplementation. No new symptoms or concerns reported. - Refill vitamin D  prescription  Chronic migraines Chronic migraines are well-managed with Ubrelvy , which she describes as a lifeline. Insurance issues with prescription quantity were resolved. - Continue Ubrelvy  for migraine management - Ensure prescription is for 90 days    She was informed of the importance of frequent follow up visits to maximize her success with intensive lifestyle modifications for her multiple health conditions.    Jasmine Mesi, MD

## 2024-06-22 ENCOUNTER — Encounter (INDEPENDENT_AMBULATORY_CARE_PROVIDER_SITE_OTHER): Payer: Self-pay | Admitting: Family Medicine

## 2024-06-22 ENCOUNTER — Ambulatory Visit (INDEPENDENT_AMBULATORY_CARE_PROVIDER_SITE_OTHER): Admitting: Family Medicine

## 2024-06-22 VITALS — BP 97/61 | HR 76 | Temp 98.0°F | Ht 62.0 in | Wt 139.0 lb

## 2024-06-22 DIAGNOSIS — R7303 Prediabetes: Secondary | ICD-10-CM | POA: Diagnosis not present

## 2024-06-22 DIAGNOSIS — G43809 Other migraine, not intractable, without status migrainosus: Secondary | ICD-10-CM

## 2024-06-22 DIAGNOSIS — R632 Polyphagia: Secondary | ICD-10-CM

## 2024-06-22 DIAGNOSIS — E559 Vitamin D deficiency, unspecified: Secondary | ICD-10-CM | POA: Diagnosis not present

## 2024-06-22 DIAGNOSIS — Z6825 Body mass index (BMI) 25.0-25.9, adult: Secondary | ICD-10-CM

## 2024-06-22 DIAGNOSIS — E782 Mixed hyperlipidemia: Secondary | ICD-10-CM

## 2024-06-22 DIAGNOSIS — E611 Iron deficiency: Secondary | ICD-10-CM

## 2024-06-22 DIAGNOSIS — E669 Obesity, unspecified: Secondary | ICD-10-CM

## 2024-06-22 DIAGNOSIS — Z6830 Body mass index (BMI) 30.0-30.9, adult: Secondary | ICD-10-CM | POA: Insufficient documentation

## 2024-06-22 DIAGNOSIS — E785 Hyperlipidemia, unspecified: Secondary | ICD-10-CM | POA: Diagnosis not present

## 2024-06-22 MED ORDER — VITAMIN D (ERGOCALCIFEROL) 1.25 MG (50000 UNIT) PO CAPS: 50000.0000 [IU] | ORAL_CAPSULE | ORAL | 0 refills | Status: DC

## 2024-06-22 MED ORDER — ZEPBOUND 15 MG/0.5ML ~~LOC~~ SOAJ: 15.0000 mg | SUBCUTANEOUS | 0 refills | Status: DC

## 2024-06-22 MED ORDER — UBRELVY 100 MG PO TABS: 100.0000 mg | ORAL_TABLET | ORAL | 3 refills | Status: DC | PRN

## 2024-06-22 MED ORDER — ATORVASTATIN CALCIUM 10 MG PO TABS: ORAL_TABLET | ORAL | 1 refills | Status: DC

## 2024-06-22 NOTE — Progress Notes (Signed)
 Office: [phone]  /  Fax: [phone]  WEIGHT SUMMARY AND BIOMETRICS  Anthropometric Measurements Height: 5' 2 (1.575 m) Weight: 139 lb (63 kg) BMI (Calculated): 25.42 Weight at Last Visit: 148 lb Weight Lost Since Last Visit: 9 lb Weight Gained Since Last Visit: 0 Starting Weight: 161 lb Total Weight Loss (lbs): 22 lb (9.979 kg) Peak Weight: 163 lb   Body Composition  Body Fat %: 28.3 % Fat Mass (lbs): 39.4 lbs Muscle Mass (lbs): 94.8 lbs Total Body Water (lbs): 68.2 lbs Visceral Fat Rating : 6   Other Clinical Data Fasting: yes Labs: no Today's Visit #: 34 Starting Date: 06/30/18    Chief Complaint: OBESITY    History of Present Illness Heather Moreno is a 53 year old female with obesity and hyperlipidemia who presents for obesity treatment and progress assessment.  She is actively managing her obesity through a structured plan involving dietary changes and physical activity. She follows a journaling plan with a daily intake of 1500 calories and 90 or more grams of protein, adhering to this regimen 60-70% of the time. Her physical activity includes walking for 30 minutes twice a week. Over the last three months, she has successfully lost 9 pounds.  She is managing hyperlipidemia with Lipitor 10 mg. She is focused on diet, exercise, and weight loss as part of her management strategy and requests a refill of her medication today.  She has a history of prediabetes and is concentrating on diet, exercise, and weight loss to manage her condition. Her most recent hemoglobin A1c was 6.1, measured about eight months ago.  She experiences polyphagia, which is being managed with Zepbound . She reports stability on this medication and requests a refill.  She has a history of vitamin D  deficiency and is taking vitamin D  supplements. Her most recent vitamin D  level was 37.9, with a goal of 50 to 60. She requests a refill of her vitamin D  supplements.  She has a history of  migraine headaches, which are being controlled with Ubrelvy . She reports stability and requests a refill of this medication.      PHYSICAL EXAM:  Blood pressure 97/61, pulse 76, temperature 98 F (36.7 C), height 5' 2 (1.575 m), weight 139 lb (63 kg), SpO2 99%. Body mass index is 25.42 kg/m.  DIAGNOSTIC DATA REVIEWED:  BMET    Component Value Date/Time   NA 137 10/07/2023 0902   K 4.6 10/07/2023 0902   CL 98 10/07/2023 0902   CO2 22 10/07/2023 0902   GLUCOSE 101 (H) 10/07/2023 0902   BUN 13 10/07/2023 0902   CREATININE 0.92 10/07/2023 0902   CALCIUM  10.1 10/07/2023 0902   GFRNONAA 78 08/21/2020 0742   GFRAA 90 08/21/2020 0742   Lab Results  Component Value Date   HGBA1C 6.1 (H) 10/07/2023   HGBA1C 6.3 (H) 06/30/2018   Lab Results  Component Value Date   INSULIN  24.9 10/07/2023   INSULIN  25.3 (H) 06/30/2018   Lab Results  Component Value Date   TSH 2.100 10/07/2023   CBC    Component Value Date/Time   WBC 6.3 12/16/2022 1202   RBC 5.05 12/16/2022 1202   HGB 14.3 12/16/2022 1202   HCT 44.3 12/16/2022 1202   PLT 323 12/16/2022 1202   MCV 88 12/16/2022 1202   MCH 28.3 12/16/2022 1202   MCHC 32.3 12/16/2022 1202   RDW 12.8 12/16/2022 1202   Iron Studies    Component Value Date/Time   IRON 143 12/16/2022 1202  TIBC 336 12/16/2022 1202   FERRITIN 103 12/16/2022 1202   IRONPCTSAT 43 12/16/2022 1202   Lipid Panel     Component Value Date/Time   CHOL 201 (H) 10/07/2023 0902   TRIG 134 10/07/2023 0902   HDL 87 10/07/2023 0902   LDLCALC 91 10/07/2023 0902   Hepatic Function Panel     Component Value Date/Time   PROT 7.5 10/07/2023 0902   ALBUMIN 4.7 10/07/2023 0902   AST 21 10/07/2023 0902   ALT 34 (H) 10/07/2023 0902   ALKPHOS 132 (H) 10/07/2023 0902   BILITOT 0.4 10/07/2023 0902      Component Value Date/Time   TSH 2.100 10/07/2023 0902   Nutritional Lab Results  Component Value Date   VD25OH 37.9 10/07/2023   VD25OH 35.9 12/16/2022    VD25OH 60.9 07/28/2022     Assessment and Plan Assessment & Plan Obesity She follows a journaling plan with 1500 calories and 90 or more grams of protein, adhering 60-70% of the time. She walks 30 minutes twice a week and has lost 9 pounds in the last three months. Her BMI is now 25, indicating significant improvement. She understands the importance of maintaining adequate nutrition, particularly protein intake, to manage her condition and prevent muscle loss. - Continue current diet and exercise regimen - Encourage adherence to protein intake goals  Prediabetes She is managing prediabetes through diet, exercise, and weight loss. Her most recent hemoglobin A1c was 6.1 eight months ago. - Order hemoglobin A1c test - Continue current diet and exercise regimen  Hyperlipidemia She is on Lipitor 10 mg and is managing her condition with diet, exercise, and weight loss. She requests a medication refill. - Refill Lipitor 10 mg - Continue current diet and exercise regimen  Polyphagia She is well-managed on Zepbound  for polyphagia and requests a refill. She understands the importance of maintaining adequate nutrition, particularly protein intake. - Refill Zepbound  - Encourage adequate protein intake  Migraine Headaches Her migraine headaches are controlled with Ubrelvy . She reports suboptimal control partly due to irregular eating and requests a medication refill. - Refill Ubrelvy  - Encourage regular eating habits to help manage migraines  Vitamin D  Deficiency She is on vitamin D  supplementation. Her most recent vitamin D  level was 37.9, with a goal of 50-60. She requests a refill. - Refill vitamin D  supplementation - Order vitamin D  level test  General Health Maintenance She is interested in monitoring thyroid  function, A1c, lipids, B12, vitamin D , and ferritin levels. She takes a liquid iron supplement due to previous low iron levels and is postmenopausal. - Order thyroid  function  tests - Order lipid panel - Order B12 level test - Order ferritin level test  Follow-up She is due for follow-up in three months. - Schedule follow-up appointment in three months    She was informed of the importance of frequent follow up visits to maximize her success with intensive lifestyle modifications for her multiple health conditions.    Louann Penton, MD

## 2024-06-23 LAB — LIPID PANEL WITH LDL/HDL RATIO

## 2024-06-27 LAB — CMP14+EGFR
ALT: 26 IU/L (ref 0–32)
AST: 17 IU/L (ref 0–40)
Albumin: 4.6 g/dL (ref 3.8–4.9)
Alkaline Phosphatase: 110 IU/L (ref 44–121)
BUN/Creatinine Ratio: 16 (ref 9–23)
BUN: 14 mg/dL (ref 6–24)
Bilirubin Total: <0.2 mg/dL (ref 0.0–1.2)
CO2: 17 mmol/L — ABNORMAL LOW (ref 20–29)
Calcium: 9.7 mg/dL (ref 8.7–10.2)
Chloride: 100 mmol/L (ref 96–106)
Creatinine, Ser: 0.9 mg/dL (ref 0.57–1.00)
Globulin, Total: 2.4 g/dL (ref 1.5–4.5)
Glucose: 88 mg/dL (ref 70–99)
Potassium: 4.6 mmol/L (ref 3.5–5.2)
Sodium: 139 mmol/L (ref 134–144)
Total Protein: 7 g/dL (ref 6.0–8.5)
eGFR: 77 mL/min/1.73 (ref >59)

## 2024-06-27 LAB — CBC WITH DIFFERENTIAL/PLATELET
Basophils Absolute: 0.1 x10E3/uL (ref 0.0–0.2)
Basos: 2 %
EOS (ABSOLUTE): 0.3 x10E3/uL (ref 0.0–0.4)
Eos: 6 %
Hematocrit: 41.8 % (ref 34.0–46.6)
Hemoglobin: 13.7 g/dL (ref 11.1–15.9)
Immature Grans (Abs): 0 x10E3/uL (ref 0.0–0.1)
Immature Granulocytes: 0 %
Lymphocytes Absolute: 1.6 x10E3/uL (ref 0.7–3.1)
Lymphs: 27 %
MCH: 29.7 pg (ref 26.6–33.0)
MCHC: 32.8 g/dL (ref 31.5–35.7)
MCV: 91 fL (ref 79–97)
Monocytes Absolute: 0.5 x10E3/uL (ref 0.1–0.9)
Monocytes: 9 %
Neutrophils Absolute: 3.4 x10E3/uL (ref 1.4–7.0)
Neutrophils: 56 %
Platelets: 272 x10E3/uL (ref 150–450)
RBC: 4.62 x10E6/uL (ref 3.77–5.28)
RDW: 12.8 % (ref 11.7–15.4)
WBC: 6 x10E3/uL (ref 3.4–10.8)

## 2024-06-27 LAB — LIPID PANEL WITH LDL/HDL RATIO
Cholesterol, Total: 171 mg/dL (ref 100–199)
HDL: 73 mg/dL (ref >39)
LDL Chol Calc (NIH): 80 mg/dL (ref 0–99)
LDL/HDL Ratio: 1.1 ratio (ref 0.0–3.2)
Triglycerides: 100 mg/dL (ref 0–149)
VLDL Cholesterol Cal: 18 mg/dL (ref 5–40)

## 2024-06-27 LAB — FERRITIN: Ferritin: 103 ng/mL (ref 15–150)

## 2024-06-27 LAB — TSH: TSH: 1.93 u[IU]/mL (ref 0.450–4.500)

## 2024-06-27 LAB — INSULIN, RANDOM: INSULIN: 8.4 u[IU]/mL (ref 2.6–24.9)

## 2024-06-27 LAB — HEMOGLOBIN A1C
Est. average glucose Bld gHb Est-mCnc: 120 mg/dL
Hgb A1c MFr Bld: 5.8 % — ABNORMAL HIGH (ref 4.8–5.6)

## 2024-06-27 LAB — FOLATE: Folate: 19.7 ng/mL (ref >3.0)

## 2024-06-27 LAB — IRON AND TIBC
Iron Saturation: 36 % (ref 15–55)
Iron: 108 ug/dL (ref 27–159)
Total Iron Binding Capacity: 297 ug/dL (ref 250–450)
UIBC: 189 ug/dL (ref 131–425)

## 2024-06-27 LAB — VITAMIN D 25 HYDROXY (VIT D DEFICIENCY, FRACTURES): Vit D, 25-Hydroxy: 67.7 ng/mL (ref 30.0–100.0)

## 2024-06-27 LAB — T4, FREE: Free T4: 1.51 ng/dL (ref 0.82–1.77)

## 2024-06-27 LAB — T3: T3, Total: 104 ng/dL (ref 71–180)

## 2024-06-27 LAB — VITAMIN B12: Vitamin B-12: 1199 pg/mL (ref 232–1245)

## 2024-08-28 ENCOUNTER — Telehealth (INDEPENDENT_AMBULATORY_CARE_PROVIDER_SITE_OTHER): Payer: Self-pay

## 2024-08-28 NOTE — Telephone Encounter (Signed)
 Dear Heather Moreno pleased to let you know that we've approved your or your doctor's request for coverage for Zepbound  15MG /0.5ML [place] [facility]. You can now fill your prescription, and it will be covered according to your plan.  As long as you remain covered by your prescription drug plan and there are no changes to your plan benefits, this request is approved from 08/28/2024 to 08/28/2025. When this approval expires, please speak to your doctor about your treatment.  Patient notified via my chart.

## 2024-09-18 ENCOUNTER — Ambulatory Visit (INDEPENDENT_AMBULATORY_CARE_PROVIDER_SITE_OTHER): Admitting: Family Medicine

## 2024-09-18 ENCOUNTER — Encounter (INDEPENDENT_AMBULATORY_CARE_PROVIDER_SITE_OTHER): Payer: Self-pay | Admitting: Family Medicine

## 2024-09-18 VITALS — BP 107/66 | HR 82 | Temp 97.7°F | Ht 62.0 in | Wt 135.0 lb

## 2024-09-18 DIAGNOSIS — E782 Mixed hyperlipidemia: Secondary | ICD-10-CM | POA: Diagnosis not present

## 2024-09-18 DIAGNOSIS — R632 Polyphagia: Secondary | ICD-10-CM | POA: Diagnosis not present

## 2024-09-18 DIAGNOSIS — G43809 Other migraine, not intractable, without status migrainosus: Secondary | ICD-10-CM

## 2024-09-18 DIAGNOSIS — E559 Vitamin D deficiency, unspecified: Secondary | ICD-10-CM | POA: Diagnosis not present

## 2024-09-18 DIAGNOSIS — E669 Obesity, unspecified: Secondary | ICD-10-CM

## 2024-09-18 DIAGNOSIS — Z6824 Body mass index (BMI) 24.0-24.9, adult: Secondary | ICD-10-CM

## 2024-09-18 MED ORDER — UBRELVY 100 MG PO TABS: 100.0000 mg | ORAL_TABLET | ORAL | 0 refills | Status: DC | PRN

## 2024-09-18 MED ORDER — ZEPBOUND 15 MG/0.5ML ~~LOC~~ SOAJ: 15.0000 mg | SUBCUTANEOUS | 0 refills | Status: DC

## 2024-09-18 MED ORDER — ATORVASTATIN CALCIUM 10 MG PO TABS: ORAL_TABLET | ORAL | 1 refills | Status: DC

## 2024-09-18 MED ORDER — VITAMIN D (ERGOCALCIFEROL) 1.25 MG (50000 UNIT) PO CAPS: 50000.0000 [IU] | ORAL_CAPSULE | ORAL | 0 refills | Status: DC

## 2024-09-18 NOTE — Progress Notes (Signed)
 Office: [phone]  /  Fax: [phone]  WEIGHT SUMMARY AND BIOMETRICS  Anthropometric Measurements Height: 5' 2 (1.575 m) Weight: 135 lb (61.2 kg) BMI (Calculated): 24.69 Weight at Last Visit: 139 lb Weight Lost Since Last Visit: 4 lb Weight Gained Since Last Visit: 0 Starting Weight: 161 lb Total Weight Loss (lbs): 26 lb (11.8 kg) Peak Weight: 163 lb   Body Composition  Body Fat %: 27 % Fat Mass (lbs): 36.4 lbs Muscle Mass (lbs): 93.6 lbs Total Body Water (lbs): 66.4 lbs Visceral Fat Rating : 5   Other Clinical Data Fasting: yes Labs: no Today's Visit #: 35 Starting Date: 06/30/18    Chief Complaint: OBESITY   History of Present Illness Heather Moreno is a 53 year old female who presents for obesity treatment and progress assessment.  She is following a dietary plan of 1200 calories with a goal of 90 grams of protein, adhering to it about 70% of the time. Over the last three months, she has lost four pounds. She is working on incorporating more whole foods into her diet and is trying to meet her protein goals, aided by her child's interest in protein-rich foods. She is not currently exercising but is focusing on hydration and not skipping meals. She generally gets seven or more hours of sleep most nights.  She is being treated for hyperlipidemia and is currently taking Lipitor 10 mg at bedtime. Her last lipid panel was performed in July. She requests a refill for this medication.  For her vitamin D  deficiency, she is on prescription ergocalciferol  50,000 IU weekly. She has been out of this medication for three weeks due to misalignment of prescription refills and requests a renewal.  She experiences chronic migraines and is taking Ubrelvy  100 mg as needed, which is effective in managing her headaches. She uses it strictly as needed and notes that it does not cause drowsiness. She is anxious about running low on this medication and requests a refill.  She is on  Zepbound  15 mg per week for an unspecified condition and reports stability with no side effects. She mentions using a copay card for this medication, which is expiring in December, and is concerned about the cost of refills.      PHYSICAL EXAM:  Blood pressure 107/66, pulse 82, temperature 97.7 F (36.5 C), height 5' 2 (1.575 m), weight 135 lb (61.2 kg), SpO2 99%. Body mass index is 24.69 kg/m.  DIAGNOSTIC DATA REVIEWED:  BMET    Component Value Date/Time   NA 139 06/22/2024 0923   K 4.6 06/22/2024 0923   CL 100 06/22/2024 0923   CO2 17 (L) 06/22/2024 0923   GLUCOSE 88 06/22/2024 0923   BUN 14 06/22/2024 0923   CREATININE 0.90 06/22/2024 0923   CALCIUM  9.7 06/22/2024 0923   GFRNONAA 78 08/21/2020 0742   GFRAA 90 08/21/2020 0742   Lab Results  Component Value Date   HGBA1C 5.8 (H) 06/22/2024   HGBA1C 6.3 (H) 06/30/2018   Lab Results  Component Value Date   INSULIN  8.4 06/22/2024   INSULIN  25.3 (H) 06/30/2018   Lab Results  Component Value Date   TSH 1.930 06/22/2024   CBC    Component Value Date/Time   WBC 6.0 06/22/2024 0923   RBC 4.62 06/22/2024 0923   HGB 13.7 06/22/2024 0923   HCT 41.8 06/22/2024 0923   PLT 272 06/22/2024 0923   MCV 91 06/22/2024 0923   MCH 29.7 06/22/2024 0923   MCHC 32.8 06/22/2024 0923  RDW 12.8 06/22/2024 0923   Iron Studies    Component Value Date/Time   IRON 108 06/22/2024 0923   TIBC 297 06/22/2024 0923   FERRITIN 103 06/22/2024 0923   IRONPCTSAT 36 06/22/2024 0923   Lipid Panel     Component Value Date/Time   CHOL 171 06/22/2024 0923   TRIG 100 06/22/2024 0923   HDL 73 06/22/2024 0923   LDLCALC 80 06/22/2024 0923   Hepatic Function Panel     Component Value Date/Time   PROT 7.0 06/22/2024 0923   ALBUMIN 4.6 06/22/2024 0923   AST 17 06/22/2024 0923   ALT 26 06/22/2024 0923   ALKPHOS 110 06/22/2024 0923   BILITOT <0.2 06/22/2024 0923      Component Value Date/Time   TSH 1.930 06/22/2024 0923    Nutritional Lab Results  Component Value Date   VD25OH 67.7 06/22/2024   VD25OH 37.9 10/07/2023   VD25OH 35.9 12/16/2022     Assessment and Plan Assessment & Plan Obesity with polyphagia Obesity management is ongoing with a focus on dietary changes. She is following a 1200 calorie diet with a goal of 90 grams of protein, achieving this about 70% of the time. She has lost 4 pounds in the last 3 months. She is not currently exercising but is working on eating more whole foods, hydrating, and not skipping meals. She is generally getting 7 or more hours of sleep most nights. Lactose intolerance is noted but managed with lactose-free products. - Continue current dietary plan with a focus on increasing protein intake. - Encourage use of lactose-free dairy products to meet protein goals. - Provide high-protein chocolate pudding recipe to aid in protein intake. -Continue Zepbound  15 mg, refilled today  Mixed hyperlipidemia Mixed hyperlipidemia is being managed with Lipitor 10 mg at bedtime. Recent lipid levels were not discussed, but previous labs were satisfactory. - Refill Lipitor 10 mg at bedtime for 90 days. - Continue diet, exercise and weight loss as discussed today as an important part of the treatment plan   Vitamin D  deficiency Vitamin D  deficiency is being treated with ergocalciferol  50,000 IU weekly. Recent vitamin D  levels were in the 60s, which is considered optimal. She has been out of the medication for three weeks due to prescription misalignment. - Refill ergocalciferol  50,000 IU weekly for 90 days.  Chronic migraine Chronic migraines are being managed with Ubrelvy  100 mg as needed. She reports that the medication is effective and well-tolerated without causing drowsiness. She experiences anxiety when running low on medication due to its effectiveness. - Refill Ubrelvy  100 mg as needed for 90 days.       Heather Moreno was informed of the importance of frequent follow up visits  to maximize her success with intensive lifestyle modifications for her obesity and obesity related health conditions as recommended by USPSTF and CMS guidelines   Louann Penton, MD `1

## 2024-11-20 ENCOUNTER — Telehealth (INDEPENDENT_AMBULATORY_CARE_PROVIDER_SITE_OTHER): Payer: Self-pay

## 2024-11-20 NOTE — Telephone Encounter (Signed)
 RE: [name] approved your request for coverage of Ubrelvy  100mg  Tablets (ubrogepant ).  Dear Heather Moreno: Were pleased to let you know that weve approved your or your doctors request for coverage for Ubrelvy  100mg  Tablets (ubrogepant ). You can now fill your prescription, and it will be covered according to your plan. As long as you remain covered by your prescription drug plan and there are no changes to your plan benefits, this request is approved from 11/20/2024 to 11/20/2025. When this approval expires, please speak to your doctor about your treatment.

## 2024-12-18 ENCOUNTER — Ambulatory Visit (INDEPENDENT_AMBULATORY_CARE_PROVIDER_SITE_OTHER): Payer: Self-pay | Admitting: Family Medicine

## 2024-12-18 ENCOUNTER — Encounter (INDEPENDENT_AMBULATORY_CARE_PROVIDER_SITE_OTHER): Payer: Self-pay | Admitting: Family Medicine

## 2024-12-18 VITALS — BP 97/65 | HR 87 | Temp 98.1°F | Ht 62.0 in | Wt 131.0 lb

## 2024-12-18 DIAGNOSIS — E559 Vitamin D deficiency, unspecified: Secondary | ICD-10-CM | POA: Diagnosis not present

## 2024-12-18 DIAGNOSIS — E782 Mixed hyperlipidemia: Secondary | ICD-10-CM | POA: Diagnosis not present

## 2024-12-18 DIAGNOSIS — G43809 Other migraine, not intractable, without status migrainosus: Secondary | ICD-10-CM | POA: Diagnosis not present

## 2024-12-18 DIAGNOSIS — E669 Obesity, unspecified: Secondary | ICD-10-CM | POA: Diagnosis not present

## 2024-12-18 DIAGNOSIS — Z6824 Body mass index (BMI) 24.0-24.9, adult: Secondary | ICD-10-CM | POA: Diagnosis not present

## 2024-12-18 DIAGNOSIS — R7303 Prediabetes: Secondary | ICD-10-CM

## 2024-12-18 DIAGNOSIS — R632 Polyphagia: Secondary | ICD-10-CM

## 2024-12-18 DIAGNOSIS — Z6823 Body mass index (BMI) 23.0-23.9, adult: Secondary | ICD-10-CM

## 2024-12-18 MED ORDER — ATORVASTATIN CALCIUM 10 MG PO TABS: ORAL_TABLET | ORAL | 1 refills | Status: AC

## 2024-12-18 MED ORDER — UBRELVY 100 MG PO TABS: 100.0000 mg | ORAL_TABLET | ORAL | 0 refills | Status: AC | PRN

## 2024-12-18 MED ORDER — ZEPBOUND 15 MG/0.5ML ~~LOC~~ SOAJ: 15.0000 mg | SUBCUTANEOUS | 0 refills | Status: AC

## 2024-12-18 MED ORDER — VITAMIN D (ERGOCALCIFEROL) 1.25 MG (50000 UNIT) PO CAPS: 50000.0000 [IU] | ORAL_CAPSULE | ORAL | 0 refills | Status: AC

## 2024-12-18 NOTE — Addendum Note (Signed)
 Addended by: LAFE BAKER CROME on: 12/18/2024 09:05 AM   Modules accepted: Level of Service

## 2024-12-18 NOTE — Progress Notes (Signed)
 "  Office: [phone]  /  Fax: [phone]  WEIGHT SUMMARY AND BIOMETRICS  Anthropometric Measurements Height: 5' 2 (1.575 m) Weight: 131 lb (59.4 kg) BMI (Calculated): 23.95 Weight at Last Visit: 135 lb Weight Lost Since Last Visit: 4 lb Weight Gained Since Last Visit: 0 Starting Weight: 161 lb Total Weight Loss (lbs): 30 lb (13.6 kg)   Body Composition  Body Fat %: 27.7 % Fat Mass (lbs): 36.4 lbs Muscle Mass (lbs): 90.2 lbs Total Body Water (lbs): 66.8 lbs Visceral Fat Rating : 5   Other Clinical Data Fasting: yes Labs: no Starting Date: 06/30/18    Chief Complaint: OBESITY    History of Present Illness Heather Moreno is a 54 year old female with obesity and prediabetes who presents for obesity treatment and progress assessment.  She is following a Mounjaro plan with a goal of 1500 calories and 90 or more grams of protein daily, achieving these goals about 50% of the time. She continues to struggle with protein intake, which has been a chronic issue. She exercises two days a week, engaging in walking and weight training for 20 to 30 minutes. She has lost four pounds over the last three months since her previous visit.  In addition to obesity, she is being treated for prediabetes. She is working on decreasing simple carbohydrates and increasing exercise to help control her glucose levels. She is currently on Zepbound  for polyphagia, which also aids in managing her prediabetes.  She is managing hyperlipidemia with diet, exercise, weight loss, and Lipitor 10 mg. She requests a refill for this medication.  She is being treated for vitamin D  deficiency with prescription ergocalciferol  50,000 IU per week.  She has a history of migraine headaches, which have improved with Ubrelvy . She requests a refill for this medication as well.      PHYSICAL EXAM:  Blood pressure 97/65, pulse 87, temperature 98.1 F (36.7 C), height 5' 2 (1.575 m), weight 131 lb (59.4 kg), SpO2  98%. Body mass index is 23.96 kg/m.  DIAGNOSTIC DATA REVIEWED BY MYSELF TODAY:  BMET    Component Value Date/Time   NA 139 06/22/2024 0923   K 4.6 06/22/2024 0923   CL 100 06/22/2024 0923   CO2 17 (L) 06/22/2024 0923   GLUCOSE 88 06/22/2024 0923   BUN 14 06/22/2024 0923   CREATININE 0.90 06/22/2024 0923   CALCIUM  9.7 06/22/2024 0923   GFRNONAA 78 08/21/2020 0742   GFRAA 90 08/21/2020 0742   Lab Results  Component Value Date   HGBA1C 5.8 (H) 06/22/2024   HGBA1C 6.3 (H) 06/30/2018   Lab Results  Component Value Date   INSULIN  8.4 06/22/2024   INSULIN  25.3 (H) 06/30/2018   Lab Results  Component Value Date   TSH 1.930 06/22/2024   CBC    Component Value Date/Time   WBC 6.0 06/22/2024 0923   RBC 4.62 06/22/2024 0923   HGB 13.7 06/22/2024 0923   HCT 41.8 06/22/2024 0923   PLT 272 06/22/2024 0923   MCV 91 06/22/2024 0923   MCH 29.7 06/22/2024 0923   MCHC 32.8 06/22/2024 0923   RDW 12.8 06/22/2024 0923   Iron Studies    Component Value Date/Time   IRON 108 06/22/2024 0923   TIBC 297 06/22/2024 0923   FERRITIN 103 06/22/2024 0923   IRONPCTSAT 36 06/22/2024 0923   Lipid Panel     Component Value Date/Time   CHOL 171 06/22/2024 0923   TRIG 100 06/22/2024 0923   HDL 73 06/22/2024  0923   LDLCALC 80 06/22/2024 0923   Hepatic Function Panel     Component Value Date/Time   PROT 7.0 06/22/2024 0923   ALBUMIN 4.6 06/22/2024 0923   AST 17 06/22/2024 0923   ALT 26 06/22/2024 0923   ALKPHOS 110 06/22/2024 0923   BILITOT <0.2 06/22/2024 0923      Component Value Date/Time   TSH 1.930 06/22/2024 0923   Nutritional Lab Results  Component Value Date   VD25OH 67.7 06/22/2024   VD25OH 37.9 10/07/2023   VD25OH 35.9 12/16/2022     Assessment and Plan Assessment & Plan Obesity Management with Mounjaro plan, 1500 calories, and 90+ grams of protein. Meeting goals 50% of the time. Exercising two days a week with walking and weights for 20-30 minutes. Lost 4  pounds in the last three months. Concerns about muscle mass loss and desire to build muscle. Current BMI is 24, with a goal to maintain or slightly increase muscle mass. Discussed potential discontinuation of GLP-1 if BMI falls below 22. - Continue Mounjaro plan with 1500 calories and 90+ grams of protein. - Encouraged exercise two days a week with walking and weights. - Monitor muscle mass and adjust exercise and protein intake as needed. - Will consider stretching Mounjaro dosing to two weeks if BMI falls below 22.  Prediabetes Management with dietary changes to decrease simple carbohydrates and increase exercise. Zepbound  is being used for polyphagia and also aids in prediabetes management. Discussed the use of a copay card to reduce Zepbound  cost. - Continue dietary changes to decrease simple carbohydrates. - Continue exercise regimen. - Continue Zepbound  for polyphagia and prediabetes management. - Use copay card to reduce Zepbound  cost.  Mixed hyperlipidemia Managed with diet, exercise, weight loss, and Lipitor 10 mg. Requests a refill of Lipitor. - Continue diet, exercise, and weight loss efforts. - Refilled Lipitor 10 mg.  Vitamin D  deficiency Managed with prescription ergocalciferol  50,000 IU per week. Requests a refill. - Continue ergocalciferol  50,000 IU per week. - Refilled ergocalciferol  prescription.  Migraine Headaches improved with Ubrelvy . Requests a refill. Stress identified as a trigger for migraines. - Continue Ubrelvy  for migraine management. - Refilled Ubrelvy  prescription.      Patients who are on anti-obesity medications are counseled on the importance of maintaining healthy lifestyle habits, including balanced nutrition, regular physical activity, and behavioral modifications,  Medication is an adjunct to, not a replacement for, lifestyle changes and that the long-term success and weight maintenance depend on continued adherence to these strategies.   Heather Moreno  was informed of the importance of frequent follow up visits to maximize her success with intensive lifestyle modifications for her obesity and obesity related health conditions as recommended by USPSTF and CMS guidelines  Louann Penton, MD   "

## 2024-12-19 LAB — HEMOGLOBIN A1C
Est. average glucose Bld gHb Est-mCnc: 117 mg/dL
Hgb A1c MFr Bld: 5.7 % — ABNORMAL HIGH (ref 4.8–5.6)

## 2024-12-19 LAB — VITAMIN D 25 HYDROXY (VIT D DEFICIENCY, FRACTURES): Vit D, 25-Hydroxy: 70.2 ng/mL (ref 30.0–100.0)

## 2024-12-19 LAB — LIPID PANEL WITH LDL/HDL RATIO
Cholesterol, Total: 176 mg/dL (ref 100–199)
HDL: 89 mg/dL
LDL Chol Calc (NIH): 76 mg/dL (ref 0–99)
LDL/HDL Ratio: 0.9 ratio (ref 0.0–3.2)
Triglycerides: 54 mg/dL (ref 0–149)
VLDL Cholesterol Cal: 11 mg/dL (ref 5–40)

## 2024-12-19 LAB — CMP14+EGFR
ALT: 23 IU/L (ref 0–32)
AST: 16 IU/L (ref 0–40)
Albumin: 4.5 g/dL (ref 3.8–4.9)
Alkaline Phosphatase: 107 IU/L (ref 49–135)
BUN/Creatinine Ratio: 16 (ref 9–23)
BUN: 15 mg/dL (ref 6–24)
Bilirubin Total: 0.5 mg/dL (ref 0.0–1.2)
CO2: 21 mmol/L (ref 20–29)
Calcium: 9.1 mg/dL (ref 8.7–10.2)
Chloride: 102 mmol/L (ref 96–106)
Creatinine, Ser: 0.91 mg/dL (ref 0.57–1.00)
Globulin, Total: 2.5 g/dL (ref 1.5–4.5)
Glucose: 90 mg/dL (ref 70–99)
Potassium: 4.6 mmol/L (ref 3.5–5.2)
Sodium: 138 mmol/L (ref 134–144)
Total Protein: 7 g/dL (ref 6.0–8.5)
eGFR: 75 mL/min/1.73

## 2024-12-19 LAB — VITAMIN B12: Vitamin B-12: 1230 pg/mL (ref 232–1245)

## 2024-12-19 LAB — INSULIN, RANDOM: INSULIN: 6.9 u[IU]/mL (ref 2.6–24.9)

## 2024-12-19 LAB — TSH: TSH: 2.13 u[IU]/mL (ref 0.450–4.500)

## 2025-06-18 ENCOUNTER — Ambulatory Visit (INDEPENDENT_AMBULATORY_CARE_PROVIDER_SITE_OTHER): Admitting: Family Medicine
